# Patient Record
Sex: Male | Born: 1955 | Race: White | Hispanic: No | Smoking: Former smoker
Health system: Southern US, Community
[De-identification: ages and names within clinical notes are randomized; demographics above are authoritative.]

## PROBLEM LIST (undated history)

## (undated) DIAGNOSIS — T7840XA Allergy, unspecified, initial encounter: Secondary | ICD-10-CM

## (undated) DIAGNOSIS — E119 Type 2 diabetes mellitus without complications: Secondary | ICD-10-CM

## (undated) DIAGNOSIS — I214 Non-ST elevation (NSTEMI) myocardial infarction: Secondary | ICD-10-CM

## (undated) DIAGNOSIS — I10 Essential (primary) hypertension: Secondary | ICD-10-CM

## (undated) DIAGNOSIS — F419 Anxiety disorder, unspecified: Secondary | ICD-10-CM

## (undated) HISTORY — PX: HERNIA REPAIR: SHX51

## (undated) HISTORY — PX: KNEE SURGERY: SHX244

## (undated) HISTORY — DX: Non-ST elevation (NSTEMI) myocardial infarction: I21.4

## (undated) HISTORY — DX: Type 2 diabetes mellitus without complications: E11.9

## (undated) HISTORY — DX: Anxiety disorder, unspecified: F41.9

## (undated) HISTORY — DX: Allergy, unspecified, initial encounter: T78.40XA

## (undated) HISTORY — PX: TONSILLECTOMY: SUR1361

## (undated) HISTORY — DX: Essential (primary) hypertension: I10

---

## 2013-06-12 ENCOUNTER — Emergency Department (HOSPITAL_COMMUNITY): Payer: Self-pay

## 2013-06-12 ENCOUNTER — Encounter (HOSPITAL_COMMUNITY): Payer: Self-pay | Admitting: Emergency Medicine

## 2013-06-12 ENCOUNTER — Emergency Department (HOSPITAL_COMMUNITY)
Admission: EM | Admit: 2013-06-12 | Discharge: 2013-06-13 | Disposition: A | Discharge status: Home / Self Care | Payer: Self-pay | Attending: Emergency Medicine | Admitting: Emergency Medicine

## 2013-06-12 DIAGNOSIS — Z79899 Other long term (current) drug therapy: Secondary | ICD-10-CM | POA: Insufficient documentation

## 2013-06-12 DIAGNOSIS — R61 Generalized hyperhidrosis: Secondary | ICD-10-CM | POA: Insufficient documentation

## 2013-06-12 DIAGNOSIS — Z87891 Personal history of nicotine dependence: Secondary | ICD-10-CM | POA: Insufficient documentation

## 2013-06-12 DIAGNOSIS — Z7982 Long term (current) use of aspirin: Secondary | ICD-10-CM | POA: Insufficient documentation

## 2013-06-12 DIAGNOSIS — R110 Nausea: Secondary | ICD-10-CM | POA: Insufficient documentation

## 2013-06-12 DIAGNOSIS — R531 Weakness: Secondary | ICD-10-CM

## 2013-06-12 DIAGNOSIS — R5381 Other malaise: Secondary | ICD-10-CM | POA: Insufficient documentation

## 2013-06-12 DIAGNOSIS — R42 Dizziness and giddiness: Secondary | ICD-10-CM | POA: Insufficient documentation

## 2013-06-12 LAB — COMPREHENSIVE METABOLIC PANEL
ALT: 17 U/L (ref 0–53)
AST: 17 U/L (ref 0–37)
Alkaline Phosphatase: 73 U/L (ref 39–117)
CO2: 25 mEq/L (ref 19–32)
Calcium: 9.3 mg/dL (ref 8.4–10.5)
GFR calc non Af Amer: 78 mL/min — ABNORMAL LOW (ref >90)
Potassium: 3.7 mEq/L (ref 3.5–5.1)
Sodium: 138 mEq/L (ref 135–145)

## 2013-06-12 LAB — CBC WITH DIFFERENTIAL/PLATELET
Basophils Absolute: 0 10*3/uL (ref 0.0–0.1)
Eosinophils Relative: 1 % (ref 0–5)
Lymphocytes Relative: 12 % (ref 12–46)
Lymphs Abs: 1.9 10*3/uL (ref 0.7–4.0)
Neutro Abs: 12.3 10*3/uL — ABNORMAL HIGH (ref 1.7–7.7)
Neutrophils Relative %: 79 % — ABNORMAL HIGH (ref 43–77)
Platelets: 228 10*3/uL (ref 150–400)
RBC: 4.83 MIL/uL (ref 4.22–5.81)
RDW: 14.5 % (ref 11.5–15.5)
WBC: 15.6 10*3/uL — ABNORMAL HIGH (ref 4.0–10.5)

## 2013-06-12 LAB — URINALYSIS, ROUTINE W REFLEX MICROSCOPIC
Glucose, UA: NEGATIVE mg/dL
Ketones, ur: NEGATIVE mg/dL
Leukocytes, UA: NEGATIVE
pH: 6.5 (ref 5.0–8.0)

## 2013-06-12 LAB — URINE MICROSCOPIC-ADD ON

## 2013-06-12 NOTE — ED Provider Notes (Addendum)
CSN: [id number]     Arrival date & time 06/12/13  2006 History     First MD Initiated Contact with Patient 06/12/13 2008     No chief complaint on file.  (Consider location/radiation/quality/duration/timing/severity/associated sxs/prior Treatment) HPI 57 year old male with a significant medical history presents with feeling of generalized weakness.  Patient reports waking up this morning and feeling "not right". Reports he's been feeling "off" all day, feeling weak all over, diaphoreti at times this AM and lightheaded. Also notes 2 episodes of nausea. His symptoms were present upon waking this morning and having constant and worsened in waves.  Denies any chest pain, shortness of breath, fevers, cough, sore throat, abdominal pain, vomiting, diarrhea, dysuria, headache, numbness, focal weakness.  Nausea improved this afternoon around 2:30PM after eating.  Denies any cardiac history, and denies history of hypertension, hyperlipidemia, diabetes, history of DVT/PE, family history of DVT/PE, recent surgeries or immobilization, leg pain.  Has not seen a physician in greater than 3 years. Quit smoking 1 year ago.  History reviewed. No pertinent past medical history. Past Surgical History  Procedure Laterality Date  . Knee surgery     No family history on file. History  Substance Use Topics  . Smoking status: Former Games developer  . Smokeless tobacco: Not on file  . Alcohol Use: Not on file    Review of Systems  Constitutional: Negative for fever.  HENT: Negative for sore throat and neck stiffness.   Eyes: Negative for visual disturbance.  Respiratory: Negative for shortness of breath.   Cardiovascular: Negative for chest pain, palpitations and leg swelling.  Gastrointestinal: Positive for nausea. Negative for vomiting, abdominal pain and diarrhea.  Genitourinary: Negative for difficulty urinating.  Musculoskeletal: Negative for back pain.  Skin: Negative for rash.  Neurological: Negative for  syncope and headaches.    Allergies  Review of patient's allergies indicates no known allergies.  Home Medications   Current Outpatient Rx  Name  Route  Sig  Dispense  Refill  . aspirin EC 81 MG tablet   Oral   Take 81 mg by mouth every 30 (thirty) days. Normally takes two to three times during the month          BP 173/86  Pulse 82  Temp(Src) 98.1 F (36.7 C) (Oral)  Resp 13  Ht 5' 7.5" (1.715 m)  Wt 208 lb (94.348 kg)  BMI 32.08 kg/m2  SpO2 99% Physical Exam  Nursing note and vitals reviewed. Constitutional: He is oriented to person, place, and time. He appears well-developed and well-nourished. No distress.  HENT:  Head: Normocephalic and atraumatic.  Eyes: Conjunctivae and EOM are normal.  Neck: Normal range of motion.  Cardiovascular: Normal rate, regular rhythm, normal heart sounds and intact distal pulses.  Exam reveals no gallop and no friction rub.   No murmur heard. Pulmonary/Chest: Effort normal and breath sounds normal. No respiratory distress. He has no wheezes. He has no rales.  Abdominal: Soft. He exhibits no distension. There is no tenderness. There is no guarding.  Musculoskeletal: He exhibits no edema.  Neurological: He is alert and oriented to person, place, and time.  Skin: Skin is warm and dry. He is not diaphoretic.    ED Course   Procedures (including critical care time)  Date: 06/13/2013  Rate: 74  Rhythm: normal sinus rhythm  QRS Axis: normal  Intervals: normal  ST/T Wave abnormalities: normal, less than 1mm depression in lateral leads  Conduction Disutrbances:none  Narrative Interpretation:   Old EKG Reviewed:  none available   Labs Reviewed  CBC WITH DIFFERENTIAL - Abnormal; Notable for the following:    WBC 15.6 (*)    Neutrophils Relative % 79 (*)    Neutro Abs 12.3 (*)    Monocytes Absolute 1.2 (*)    All other components within normal limits  COMPREHENSIVE METABOLIC PANEL - Abnormal; Notable for the following:    Glucose,  Bld 113 (*)    GFR calc non Af Amer 78 (*)    All other components within normal limits  URINALYSIS, ROUTINE W REFLEX MICROSCOPIC - Abnormal; Notable for the following:    Hgb urine dipstick TRACE (*)    All other components within normal limits  TROPONIN I  MAGNESIUM  URINE MICROSCOPIC-ADD ON   Dg Chest 2 View  06/12/2013   *RADIOLOGY REPORT*  Clinical Data: Generalized weakness.  CHEST - 2 VIEW  Comparison: None.  Findings: The heart size and pulmonary vascularity are normal and the lungs are clear.  Thoracic aorta is slightly tortuous.  No acute osseous abnormality.  Slight narrowing of the trachea at the thoracic inlet may be due to an enlarged thyroid gland.  IMPRESSION: Possible enlargement of the thyroid gland.  Otherwise,  benign- appearing chest.   Original Report Authenticated By: Francene Boyers, M.D.   1. Generalized weakness     MDM  57 year old male with a significant medical history presents with feeling of generalized weakness.  Patient reports waking up this morning and feeling "not right". Patient with vague symptoms, and denies any chest pain, shortness of breath, infectious symptoms, neurologic symptoms or headache. EKG was evaluated by me and shows no acute changes consistent with ischemia and no signs of pericarditis.  Mild ST depressions lateral leads less than 1mm. Troponin was within normal limits, and given duration of patient's symptoms have low suspicion for ACS. Electrolytes within normal limits and do not explain symptoms. Vision without signs of urinary tract infection on urinalysis. Chest x-ray does not show any signs of acute heart failure, pneumonia.  Chest x-ray does show possible enlargement of thyroid gland, discussed finding the patient.  Have low suspicion of PE given no asymmetric lights on, minimal risk factors other than age, no tachypnea, no hypoxia, new tachycardia.  Unclear cause of vague symptoms and given no chest pain, SOB or exertional symptoms do not  feel admission for unstable angina is indicated.  Discussed importance of finding a primary care physician for evaluation of cardiac and other health risk factors (including likely dx of hypertension) and also recommended possible outpatient stress test and evaluation of thyroid function as outpatient given XR finding.  Family at bedside and motivated and in agreement with lifestyle changes and close PCP follow up.  Patient discharged in stable condition with understanding of reasons to return.    Rhae Lerner, MD 06/20/13 [phone]

## 2013-06-12 NOTE — ED Notes (Signed)
MD at bedside. 

## 2013-06-12 NOTE — ED Notes (Signed)
Assumed patient care. Denies pain, awaiting test results. Only c/o hunger.

## 2013-06-13 ENCOUNTER — Ambulatory Visit (INDEPENDENT_AMBULATORY_CARE_PROVIDER_SITE_OTHER): Payer: Self-pay | Admitting: Family Medicine

## 2013-06-13 VITALS — BP 178/88 | HR 82 | Temp 98.0°F | Ht 67.5 in | Wt 208.0 lb

## 2013-06-13 DIAGNOSIS — I10 Essential (primary) hypertension: Secondary | ICD-10-CM

## 2013-06-13 DIAGNOSIS — F411 Generalized anxiety disorder: Secondary | ICD-10-CM

## 2013-06-13 MED ORDER — ALPRAZOLAM 0.25 MG PO TABS: 0.2500 mg | ORAL_TABLET | Freq: Two times a day (BID) | ORAL | Status: DC | PRN

## 2013-06-13 MED ORDER — LISINOPRIL-HYDROCHLOROTHIAZIDE 20-12.5 MG PO TABS: 1.0000 | ORAL_TABLET | Freq: Every day | ORAL | Status: DC

## 2013-06-13 NOTE — Progress Notes (Signed)
  Subjective:    Patient ID: Thomas Thomas, male    DOB: 12-Nov-1955, 57 y.o.   MRN: [id number]  HPI This 57 y.o. male presents for evaluation of hypertension and anxiety.  Thomas Thomas was seen At [facility] yesterday and was sent to [facility] ER for hypertensive emergency. He was evaluated there and had negative troponin and his labs showed leukocytosis.  He denies any Infection sx's.  He has been under a lot of stress being out of work and having to care for his mother Who is in her end stages of Alzhiemers disease.  He has been upset over this and is teary in the interview.   Review of Systems C/o anxiety No chest pain, SOB, HA, dizziness, vision change, N/V, diarrhea, constipation, dysuria, urinary urgency or frequency, myalgias, arthralgias or rash.     Objective:   Physical Exam Vital signs noted  Well developed well nourished male.  HEENT - Head atraumatic Normocephalic                Eyes - PERRLA, Conjuctiva - clear Sclera- Clear EOMI                Ears - EAC's Wnl TM's Wnl Gross Hearing WNL                Nose - Nares patent                 Throat - oropharanx wnl Respiratory - Lungs CTA bilateral Cardiac - RRR S1 and S2 without murmur GI - Abdomen soft Nontender and bowel sounds active x 4 Extremities - No edema. Neuro - Grossly intact.       Assessment & Plan:  Anxiety state, unspecified - Plan: ALPRAZolam (XANAX) 0.25 MG tablet prn for anxiety.  Essential hypertension, benign - Plan: lisinopril-hydrochlorothiazide (ZESTORETIC) 20-12.5 MG per  Follow up in one month.

## 2013-06-13 NOTE — ED Provider Notes (Signed)
Medical screening examination/treatment/procedure(s) were conducted as a shared visit with resident physician and myself.  I personally evaluated the patient during the encounter.  I examined/interviewed pt. His sx are vague. Workup is negative and he is asx on exam. He continues to appear well. Will rec f/u w/ pcp.    Junius Argyle, MD 06/13/13 1118

## 2013-06-13 NOTE — Patient Instructions (Signed)

## 2013-06-19 ENCOUNTER — Telehealth: Payer: Self-pay | Admitting: Family Medicine

## 2013-06-19 NOTE — Telephone Encounter (Signed)
Feel better with the new bp med and keeping up with it and it is doing great was wondering if it was okay to have intercourse?

## 2013-06-20 NOTE — ED Provider Notes (Addendum)
Medical screening examination/treatment/procedure(s) were performed by non-physician practitioner and as supervising physician I was immediately available for consultation/collaboration.  I examined/interviewed pt. CTAB. Cardiac wnl. Abdomen benign. His sx are vague. Workup is negative and he is asx on exam. He continues to appear well. Will rec f/u w/ pcp.     Junius Argyle, MD 06/20/13 1304  Junius Argyle, MD 06/20/13 1306

## 2013-06-20 NOTE — Telephone Encounter (Signed)
Patient aware.

## 2013-06-20 NOTE — Telephone Encounter (Signed)
Okay to have intercourse on new bp med

## 2013-07-14 ENCOUNTER — Encounter: Payer: Self-pay | Admitting: Family Medicine

## 2013-07-14 ENCOUNTER — Ambulatory Visit (INDEPENDENT_AMBULATORY_CARE_PROVIDER_SITE_OTHER): Payer: Self-pay | Admitting: Family Medicine

## 2013-07-14 VITALS — BP 116/74 | HR 85 | Temp 97.4°F | Ht 67.5 in | Wt 203.4 lb

## 2013-07-14 DIAGNOSIS — I10 Essential (primary) hypertension: Secondary | ICD-10-CM

## 2013-07-14 NOTE — Patient Instructions (Signed)

## 2013-07-14 NOTE — Progress Notes (Signed)
  Subjective:    Patient ID: Thomas Thomas, male    DOB: [birth year], 57 y.o.   MRN: [id number]  HPI This 57 y.o. male presents for evaluation of follow up on his anxiety and  Hypertesion.  He has been having some left shoulder and neck discomfort After a muscle pull.   Review of Systems C/o muscle strain No chest pain, SOB, HA, dizziness, vision change, N/V, diarrhea, constipation, dysuria, urinary urgency or frequency, arthralgias or rash.     Objective:   Physical Exam Vital signs noted  Well developed well nourished male.  HEENT - Head atraumatic Normocephalic                Eyes - PERRLA, Conjuctiva - clear Sclera- Clear EOMI                Ears - EAC's Wnl TM's Wnl Gross Hearing WNL                Nose - Nares patent                 Throat - oropharanx wnl Respiratory - Lungs CTA bilateral Cardiac - RRR S1 and S2 without murmur GI - Abdomen soft Nontender and bowel sounds active x 4 Extremities - No edema. Neuro - Grossly intact. MS - TTP left scapular and rhomboid muscle      Assessment & Plan:  Essential hypertension, benign BP is low and need to cut bp medicine in half and then take one half lisinopril HCT 20/12.5 qd.  Muscle strain - Trapezius or rhomboid muscle strain.  Recommend motrin 600 to 800mg  po tid For a week.

## 2013-07-25 ENCOUNTER — Ambulatory Visit (INDEPENDENT_AMBULATORY_CARE_PROVIDER_SITE_OTHER): Payer: Self-pay

## 2013-07-25 ENCOUNTER — Encounter: Payer: Self-pay | Admitting: Family Medicine

## 2013-07-25 ENCOUNTER — Ambulatory Visit (INDEPENDENT_AMBULATORY_CARE_PROVIDER_SITE_OTHER): Payer: Self-pay | Admitting: Family Medicine

## 2013-07-25 VITALS — BP 115/74 | HR 98 | Temp 97.4°F | Ht 66.0 in | Wt 203.6 lb

## 2013-07-25 DIAGNOSIS — M542 Cervicalgia: Secondary | ICD-10-CM

## 2013-07-25 NOTE — Patient Instructions (Addendum)
Place neck pain patient instructions here.  
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
## 2013-10-27 ENCOUNTER — Ambulatory Visit: Payer: Self-pay | Admitting: Family Medicine

## 2013-11-02 ENCOUNTER — Encounter: Payer: Self-pay | Admitting: Family Medicine

## 2013-11-02 ENCOUNTER — Ambulatory Visit (INDEPENDENT_AMBULATORY_CARE_PROVIDER_SITE_OTHER): Payer: BC Managed Care – PPO | Admitting: Family Medicine

## 2013-11-02 VITALS — BP 135/77 | HR 74 | Temp 98.1°F | Ht 66.0 in | Wt 198.0 lb

## 2013-11-02 DIAGNOSIS — Z0000 Encounter for general adult medical examination without abnormal findings: Secondary | ICD-10-CM

## 2013-11-02 DIAGNOSIS — R21 Rash and other nonspecific skin eruption: Secondary | ICD-10-CM

## 2013-11-02 DIAGNOSIS — Z1211 Encounter for screening for malignant neoplasm of colon: Secondary | ICD-10-CM

## 2013-11-02 LAB — POCT CBC
Granulocyte percent: 79 %G (ref 37–80)
HCT, POC: 43.1 % — AB (ref 43.5–53.7)
Hemoglobin: 13.9 g/dL — AB (ref 14.1–18.1)
Lymph, poc: 1.8 (ref 0.6–3.4)
MCH, POC: 28.5 pg (ref 27–31.2)
MCHC: 32.1 g/dL (ref 31.8–35.4)
MCV: 88.6 fL (ref 80–97)
MPV: 10.1 fL (ref 0–99.8)
POC Granulocyte: 9.3 — AB (ref 2–6.9)
POC LYMPH PERCENT: 15.2 %L (ref 10–50)
Platelet Count, POC: 216 10*3/uL (ref 142–424)
RBC: 4.9 M/uL (ref 4.69–6.13)
RDW, POC: 14.6 %
WBC: 11.8 10*3/uL — AB (ref 4.6–10.2)

## 2013-11-02 MED ORDER — NYSTATIN-TRIAMCINOLONE 100000-0.1 UNIT/GM-% EX OINT: 1.0000 "application " | TOPICAL_OINTMENT | Freq: Two times a day (BID) | CUTANEOUS | Status: DC

## 2013-11-02 MED ORDER — LISINOPRIL-HYDROCHLOROTHIAZIDE 20-12.5 MG PO TABS: 0.5000 | ORAL_TABLET | Freq: Every day | ORAL | Status: DC

## 2013-11-02 MED ORDER — OMEPRAZOLE 40 MG PO CPDR: 40.0000 mg | DELAYED_RELEASE_CAPSULE | Freq: Every day | ORAL | Status: DC

## 2013-11-02 NOTE — Progress Notes (Signed)
   Subjective:    Patient ID: Thomas Thomas, male    DOB: Oct 08, 1956, 58 y.o.   MRN: [id number]  HPI This 58 y.o. male presents for evaluation of hypertension.  He has hx of anxiety and states He has made changes to his life and is not stressed out anymore.  He states he doesn't take Xanax anymore and has plenty left in the bottle.  He has not had colonoscopy and he is due for Physical and CPE labs.  He has been checking his bp at home and it runs 130/70's.   Review of Systems    No chest pain, SOB, HA, dizziness, vision change, N/V, diarrhea, constipation, dysuria, urinary urgency or frequency, myalgias, arthralgias or rash.  Objective:   Physical Exam  Vital signs noted  Well developed well nourished male.  HEENT - Head atraumatic Normocephalic                Eyes - PERRLA, Conjuctiva - clear Sclera- Clear EOMI                Ears - EAC's Wnl TM's Wnl Gross Hearing WNL                Nose - Nares patent                 Throat - oropharanx wnl Respiratory - Lungs CTA bilateral Cardiac - RRR S1 and S2 without murmur GI - Abdomen soft Nontender and bowel sounds active x 4 Rectal - Declines Extremities - No edema. Neuro - Grossly intact.      Assessment & Plan:  Health care maintenance - Plan: Ambulatory referral to Gastroenterology, lisinopril-hydrochlorothiazide (PRINZIDE,ZESTORETIC) 20-12.5 MG per tablet, nystatin-triamcinolone ointment (MYCOLOG)  Rash and nonspecific skin eruption - Plan: lisinopril-hydrochlorothiazide (PRINZIDE,ZESTORETIC) 20-12.5 MG per tablet, nystatin-triamcinolone ointment (MYCOLOG)  Screening for colon cancer - Plan: Ambulatory referral to Gastroenterology  Routine general medical examination at a health care facility - Plan: POCT CBC, CMP14+EGFR, Lipid panel, PSA, total and free, Vit D  25 hydroxy (rtn osteoporosis monitoring)  Follow up in 6 months.  Lysbeth Penner FNP

## 2013-11-02 NOTE — Patient Instructions (Addendum)

## 2013-11-02 NOTE — Progress Notes (Signed)
Discussed need for screening colonoscopy and referral placed.

## 2013-11-03 LAB — CMP14+EGFR
ALT: 8 IU/L (ref 0–44)
AST: 15 IU/L (ref 0–40)
Albumin/Globulin Ratio: 1.8 (ref 1.1–2.5)
Albumin: 4.4 g/dL (ref 3.5–5.5)
Alkaline Phosphatase: 72 IU/L (ref 39–117)
BUN/Creatinine Ratio: 10 (ref 9–20)
BUN: 12 mg/dL (ref 6–24)
CO2: 23 mmol/L (ref 18–29)
Calcium: 9.6 mg/dL (ref 8.7–10.2)
Chloride: 97 mmol/L (ref 97–108)
Creatinine, Ser: 1.16 mg/dL (ref 0.76–1.27)
GFR calc Af Amer: 80 mL/min/{1.73_m2} (ref >59)
GFR calc non Af Amer: 69 mL/min/{1.73_m2} (ref >59)
Globulin, Total: 2.5 g/dL (ref 1.5–4.5)
Glucose: 129 mg/dL — ABNORMAL HIGH (ref 65–99)
Potassium: 4.4 mmol/L (ref 3.5–5.2)
Sodium: 137 mmol/L (ref 134–144)
Total Bilirubin: 0.3 mg/dL (ref 0.0–1.2)
Total Protein: 6.9 g/dL (ref 6.0–8.5)

## 2013-11-03 LAB — LIPID PANEL
Chol/HDL Ratio: 4.6 ratio units (ref 0.0–5.0)
Cholesterol, Total: 166 mg/dL (ref 100–199)
HDL: 36 mg/dL — ABNORMAL LOW (ref >39)
LDL Calculated: 119 mg/dL — ABNORMAL HIGH (ref 0–99)
Triglycerides: 55 mg/dL (ref 0–149)
VLDL Cholesterol Cal: 11 mg/dL (ref 5–40)

## 2013-11-03 LAB — PSA, TOTAL AND FREE
PSA, Free Pct: 11.3 %
PSA, Free: 0.17 ng/mL
PSA: 1.5 ng/mL (ref 0.0–4.0)

## 2013-11-03 LAB — VITAMIN D 25 HYDROXY (VIT D DEFICIENCY, FRACTURES): Vit D, 25-Hydroxy: 16.9 ng/mL — ABNORMAL LOW (ref 30.0–100.0)

## 2013-11-06 ENCOUNTER — Telehealth: Payer: Self-pay | Admitting: Family Medicine

## 2013-11-06 ENCOUNTER — Other Ambulatory Visit: Payer: Self-pay | Admitting: Family Medicine

## 2013-11-06 MED ORDER — VITAMIN D (ERGOCALCIFEROL) 1.25 MG (50000 UNIT) PO CAPS: 50000.0000 [IU] | ORAL_CAPSULE | ORAL | Status: DC

## 2013-11-06 NOTE — Telephone Encounter (Signed)
please review labs

## 2013-11-07 ENCOUNTER — Telehealth: Payer: Self-pay | Admitting: Family Medicine

## 2013-11-14 ENCOUNTER — Encounter: Payer: Self-pay | Admitting: Internal Medicine

## 2013-11-16 ENCOUNTER — Telehealth: Payer: Self-pay | Admitting: Family Medicine

## 2013-11-22 ENCOUNTER — Other Ambulatory Visit (INDEPENDENT_AMBULATORY_CARE_PROVIDER_SITE_OTHER): Payer: BC Managed Care – PPO

## 2013-11-22 DIAGNOSIS — R7989 Other specified abnormal findings of blood chemistry: Secondary | ICD-10-CM

## 2013-11-22 LAB — POCT CBC
Granulocyte percent: 75.9 %G (ref 37–80)
HCT, POC: 42.6 % — AB (ref 43.5–53.7)
Hemoglobin: 13.5 g/dL — AB (ref 14.1–18.1)
Lymph, poc: 1.9 (ref 0.6–3.4)
MCH, POC: 28.4 pg (ref 27–31.2)
MCHC: 31.7 g/dL — AB (ref 31.8–35.4)
MCV: 89.6 fL (ref 80–97)
MPV: 11.4 fL (ref 0–99.8)
POC Granulocyte: 8.4 — AB (ref 2–6.9)
POC LYMPH PERCENT: 17 %L (ref 10–50)
Platelet Count, POC: 248 10*3/uL (ref 142–424)
RBC: 4.8 M/uL (ref 4.69–6.13)
RDW, POC: 14.8 %
WBC: 11.1 10*3/uL — AB (ref 4.6–10.2)

## 2013-11-22 NOTE — Progress Notes (Signed)
Patient came in for labs only.

## 2013-11-28 ENCOUNTER — Telehealth: Payer: Self-pay | Admitting: Family Medicine

## 2013-12-04 ENCOUNTER — Telehealth: Payer: Self-pay | Admitting: Family Medicine

## 2013-12-04 NOTE — Telephone Encounter (Signed)
In another encounter 

## 2013-12-04 NOTE — Telephone Encounter (Signed)
He said we were suppose to test for his bs and this in not in the system please order one and go over his labs patient is getting upset

## 2013-12-07 ENCOUNTER — Encounter: Payer: Self-pay | Admitting: Internal Medicine

## 2014-01-04 ENCOUNTER — Telehealth: Payer: Self-pay | Admitting: Family Medicine

## 2014-01-04 DIAGNOSIS — R21 Rash and other nonspecific skin eruption: Secondary | ICD-10-CM

## 2014-01-04 DIAGNOSIS — D649 Anemia, unspecified: Secondary | ICD-10-CM

## 2014-01-05 NOTE — Telephone Encounter (Signed)
Most recent CBC showed that Hgb was decreasing and WBC were still elevated although improved. Repeat CBC ordered for next week.   Patient also states that rash on hands and feet have improved but have not resolved. He would like [place]. Referral placed.

## 2014-03-09 ENCOUNTER — Telehealth: Payer: Self-pay | Admitting: Family Medicine

## 2014-03-13 NOTE — Telephone Encounter (Signed)
Spoke with pt to inform he will need OV appt scheduled

## 2014-03-14 ENCOUNTER — Encounter: Payer: Self-pay | Admitting: Family Medicine

## 2014-03-14 ENCOUNTER — Ambulatory Visit (INDEPENDENT_AMBULATORY_CARE_PROVIDER_SITE_OTHER): Payer: BC Managed Care – PPO | Admitting: Family Medicine

## 2014-03-14 ENCOUNTER — Ambulatory Visit (INDEPENDENT_AMBULATORY_CARE_PROVIDER_SITE_OTHER): Payer: BC Managed Care – PPO

## 2014-03-14 VITALS — BP 113/72 | HR 72 | Temp 98.1°F | Wt 197.8 lb

## 2014-03-14 DIAGNOSIS — N529 Male erectile dysfunction, unspecified: Secondary | ICD-10-CM

## 2014-03-14 DIAGNOSIS — K219 Gastro-esophageal reflux disease without esophagitis: Secondary | ICD-10-CM

## 2014-03-14 DIAGNOSIS — M25519 Pain in unspecified shoulder: Secondary | ICD-10-CM

## 2014-03-14 DIAGNOSIS — M25512 Pain in left shoulder: Secondary | ICD-10-CM

## 2014-03-14 MED ORDER — NAPROXEN 500 MG PO TABS: 500.0000 mg | ORAL_TABLET | Freq: Two times a day (BID) | ORAL | Status: DC

## 2014-03-14 MED ORDER — OMEPRAZOLE 40 MG PO CPDR: 40.0000 mg | DELAYED_RELEASE_CAPSULE | Freq: Every day | ORAL | Status: DC

## 2014-03-14 MED ORDER — SILDENAFIL CITRATE 100 MG PO TABS: 50.0000 mg | ORAL_TABLET | Freq: Every day | ORAL | Status: DC | PRN

## 2014-03-14 MED ORDER — HYDROCODONE-ACETAMINOPHEN 5-325 MG PO TABS: 1.0000 | ORAL_TABLET | Freq: Four times a day (QID) | ORAL | Status: DC | PRN

## 2014-03-14 NOTE — Progress Notes (Signed)
   Subjective:    Patient ID: Thomas Thomas, male    DOB: 01/27/1956, 58 y.o.   MRN: [id number]  HPI This 58 y.o. male presents for evaluation of left shoulder discomfort, left scapula discomfort, and  ED problems.   Review of Systems No chest pain, SOB, HA, dizziness, vision change, N/V, diarrhea, constipation, dysuria, urinary urgency or frequency, myalgias, arthralgias or rash.     Objective:   Physical Exam Vital signs noted  Well developed well nourished male.  HEENT - Head atraumatic Normocephalic                Eyes - PERRLA, Conjuctiva - clear Sclera- Clear EOMI                Ears - EAC's Wnl TM's Wnl Gross Hearing WNL                Throat - oropharanx wnl Respiratory - Lungs CTA bilateral Cardiac - RRR S1 and S2 without murmur GI - Abdomen soft Nontender and bowel sounds active x 4 Extremities - No edema. Neuro - Grossly intact. MS - TTP left sub scapular muscles and left biceps tendon   Left shoulder xray - no fracture    Assessment & Plan:  Shoulder pain, left - Plan: DG Shoulder Left, naproxen (NAPROSYN) 500 MG tablet, HYDROcodone-acetaminophen (NORCO) 5-325 MG per tablet  ED (erectile dysfunction) - Plan: sildenafil (VIAGRA) 100 MG tablet  GERD (gastroesophageal reflux disease) - Plan: omeprazole (PRILOSEC) 40 MG capsule  Deatra CanterWilliam J Leaf Kernodle FNP

## 2014-05-01 ENCOUNTER — Telehealth: Payer: Self-pay | Admitting: Family Medicine

## 2014-05-01 NOTE — Telephone Encounter (Signed)
appt scheduled for 7/22 with bill

## 2014-05-02 ENCOUNTER — Ambulatory Visit: Payer: BC Managed Care – PPO | Admitting: Family Medicine

## 2014-05-16 ENCOUNTER — Ambulatory Visit: Payer: BC Managed Care – PPO | Admitting: Family Medicine

## 2014-07-03 ENCOUNTER — Encounter: Payer: Self-pay | Admitting: Family Medicine

## 2014-07-03 ENCOUNTER — Telehealth: Payer: Self-pay | Admitting: Family Medicine

## 2014-07-03 ENCOUNTER — Ambulatory Visit (INDEPENDENT_AMBULATORY_CARE_PROVIDER_SITE_OTHER): Payer: BC Managed Care – PPO | Admitting: Family Medicine

## 2014-07-03 VITALS — BP 151/84 | HR 75 | Temp 97.1°F | Ht 66.0 in | Wt 188.6 lb

## 2014-07-03 DIAGNOSIS — R5383 Other fatigue: Secondary | ICD-10-CM

## 2014-07-03 DIAGNOSIS — R5381 Other malaise: Secondary | ICD-10-CM

## 2014-07-03 DIAGNOSIS — E785 Hyperlipidemia, unspecified: Secondary | ICD-10-CM

## 2014-07-03 DIAGNOSIS — E559 Vitamin D deficiency, unspecified: Secondary | ICD-10-CM

## 2014-07-03 DIAGNOSIS — R7309 Other abnormal glucose: Secondary | ICD-10-CM

## 2014-07-03 DIAGNOSIS — R739 Hyperglycemia, unspecified: Secondary | ICD-10-CM

## 2014-07-03 LAB — POCT CBC
Granulocyte percent: 85.1 %G — AB (ref 37–80)
HCT, POC: 44.7 % (ref 43.5–53.7)
Hemoglobin: 14.4 g/dL (ref 14.1–18.1)
Lymph, poc: 2.1 (ref 0.6–3.4)
MCH, POC: 28.1 pg (ref 27–31.2)
MCHC: 32.3 g/dL (ref 31.8–35.4)
MCV: 87 fL (ref 80–97)
MPV: 10.4 fL (ref 0–99.8)
POC Granulocyte: 13.6 — AB (ref 2–6.9)
POC LYMPH PERCENT: 13 %L (ref 10–50)
Platelet Count, POC: 193 10*3/uL (ref 142–424)
RBC: 5.1 M/uL (ref 4.69–6.13)
RDW, POC: 14.8 %
WBC: 16 10*3/uL — AB (ref 4.6–10.2)

## 2014-07-03 LAB — POCT GLYCOSYLATED HEMOGLOBIN (HGB A1C): Hemoglobin A1C: 6

## 2014-07-03 NOTE — Progress Notes (Signed)
   Subjective:    Patient ID: Thomas Thomas, male    DOB: 1956-09-19, 58 y.o.   MRN: [id number]  HPI This 58 y.o. male presents for evaluation of routine visit.  He has lost weight and has been off his Medication.  He has hx of hyperglycemia and hypertension.  He has had problems with rash on his hands and has seen dermatology and he states he put some acid on his hand that made his skin peel and then used clobetasol proprianate and this helped the rash resolve.   Review of Systems C/o rash No chest pain, SOB, HA, dizziness, vision change, N/V, diarrhea, constipation, dysuria, urinary urgency or frequency, myalgias, arthralgias.     Objective:   Physical Exam  Vital signs noted  Well developed well nourished male.  HEENT - Head atraumatic Normocephalic                Eyes - PERRLA, Conjuctiva - clear Sclera- Clear EOMI                Ears - EAC's Wnl TM's Wnl Gross Hearing WNL                Nose - Nares patent                 Throat - oropharanx wnl Respiratory - Lungs CTA bilateral Cardiac - RRR S1 and S2 without murmur GI - Abdomen soft Nontender and bowel sounds active x 4 Extremities - No edema. Neuro - Grossly intact.      Assessment & Plan:  Hyperlipemia - Plan: CMP14+EGFR, Lipid panel  Hyperglycemia - Plan: POCT glycosylated hemoglobin (Hb A1C), CMP14+EGFR  Unspecified vitamin D deficiency - Plan: Vit D  25 hydroxy (rtn osteoporosis monitoring)  Other malaise and fatigue - Plan: POCT CBC, Vit D  25 hydroxy (rtn osteoporosis monitoring)  Lysbeth Penner FNP

## 2014-07-04 ENCOUNTER — Other Ambulatory Visit: Payer: Self-pay | Admitting: Family Medicine

## 2014-07-04 ENCOUNTER — Ambulatory Visit: Payer: BC Managed Care – PPO | Admitting: Family Medicine

## 2014-07-04 ENCOUNTER — Telehealth: Payer: Self-pay | Admitting: Family Medicine

## 2014-07-04 LAB — CMP14+EGFR
ALT: 11 IU/L (ref 0–44)
AST: 10 IU/L (ref 0–40)
Albumin/Globulin Ratio: 1.9 (ref 1.1–2.5)
Albumin: 4.1 g/dL (ref 3.5–5.5)
Alkaline Phosphatase: 79 IU/L (ref 39–117)
BUN/Creatinine Ratio: 9 (ref 9–20)
BUN: 11 mg/dL (ref 6–24)
CO2: 24 mmol/L (ref 18–29)
Calcium: 9 mg/dL (ref 8.7–10.2)
Chloride: 101 mmol/L (ref 97–108)
Creatinine, Ser: 1.2 mg/dL (ref 0.76–1.27)
GFR calc Af Amer: 76 mL/min/{1.73_m2} (ref >59)
GFR calc non Af Amer: 66 mL/min/{1.73_m2} (ref >59)
Globulin, Total: 2.2 g/dL (ref 1.5–4.5)
Glucose: 116 mg/dL — ABNORMAL HIGH (ref 65–99)
Potassium: 4.5 mmol/L (ref 3.5–5.2)
Sodium: 140 mmol/L (ref 134–144)
Total Bilirubin: 0.3 mg/dL (ref 0.0–1.2)
Total Protein: 6.3 g/dL (ref 6.0–8.5)

## 2014-07-04 LAB — VITAMIN D 25 HYDROXY (VIT D DEFICIENCY, FRACTURES): Vit D, 25-Hydroxy: 29.9 ng/mL — ABNORMAL LOW (ref 30.0–100.0)

## 2014-07-04 LAB — LIPID PANEL
Chol/HDL Ratio: 3.6 ratio units (ref 0.0–5.0)
Cholesterol, Total: 140 mg/dL (ref 100–199)
HDL: 39 mg/dL — ABNORMAL LOW (ref >39)
LDL Calculated: 92 mg/dL (ref 0–99)
Triglycerides: 47 mg/dL (ref 0–149)
VLDL Cholesterol Cal: 9 mg/dL (ref 5–40)

## 2014-07-04 MED ORDER — AMOXICILLIN 875 MG PO TABS: 875.0000 mg | ORAL_TABLET | Freq: Two times a day (BID) | ORAL | Status: DC

## 2014-07-04 NOTE — Telephone Encounter (Signed)
Message copied by Azalee Course on Wed Jul 04, 2014 11:26 AM ------      Message from: Deatra Canter      Created: Wed Jul 04, 2014  9:04 AM       Vitamin D slightly low and would take vitamin D 2000 iu po qd.  Hgbaic is normal.  Lipids look good.  Liver and kidney fx is normal.  WBC are elevated and has URI ask if he needs abx? ------

## 2014-07-04 NOTE — Telephone Encounter (Signed)
He had infection in hand / feet.  His hand is better not completely healed.  He was given halobetasol 0.05%, apply 2 times a day by his dermatologist.  He also is letting Bill know [facility] mixed him EaSalicyclic Acid 50% in PE to help peal skin off hands.  This also worked.  Pt is aware of lab results.  Please forward to [facility] [employer] [facility] for [place].

## 2014-07-04 NOTE — Telephone Encounter (Signed)
Amoxicillin sent to pharmacy

## 2014-08-21 ENCOUNTER — Ambulatory Visit (INDEPENDENT_AMBULATORY_CARE_PROVIDER_SITE_OTHER): Payer: BC Managed Care – PPO

## 2014-08-21 DIAGNOSIS — Z23 Encounter for immunization: Secondary | ICD-10-CM

## 2014-09-18 ENCOUNTER — Encounter: Payer: Self-pay | Admitting: *Deleted

## 2014-10-15 ENCOUNTER — Other Ambulatory Visit: Payer: Self-pay | Admitting: Family Medicine

## 2014-10-15 NOTE — Telephone Encounter (Signed)
[facility]

## 2014-10-15 NOTE — Telephone Encounter (Signed)
This is okay to refill the patient's Cialis

## 2014-10-16 NOTE — Telephone Encounter (Signed)
Patient aware no samples available.  He had used them before and would like a script of Cialis , take one pill daily, [facility].

## 2014-10-16 NOTE — Telephone Encounter (Signed)
Thomas NiemannJaime, I'm not sure what this patient wants. We can refill the Cialis and when we get samples we can give him some. What is he requesting?

## 2014-10-16 NOTE — Telephone Encounter (Signed)
This is being done in a telephone order

## 2014-10-24 ENCOUNTER — Other Ambulatory Visit: Payer: Self-pay | Admitting: Family Medicine

## 2014-10-24 MED ORDER — TADALAFIL 20 MG PO TABS: 10.0000 mg | ORAL_TABLET | ORAL | Status: DC | PRN

## 2014-11-01 ENCOUNTER — Telehealth: Payer: Self-pay | Admitting: *Deleted

## 2014-11-01 NOTE — Telephone Encounter (Signed)
Bill ins co denied coverage saying formulary drugs must be tried and failed first and they are listing viagra as the only formulary drug to try. Can you take care of this for me? Thanks

## 2014-11-13 ENCOUNTER — Other Ambulatory Visit: Payer: Self-pay | Admitting: Family Medicine

## 2014-11-13 MED ORDER — SILDENAFIL CITRATE 100 MG PO TABS: 50.0000 mg | ORAL_TABLET | Freq: Every day | ORAL | Status: DC | PRN

## 2014-11-23 NOTE — Telephone Encounter (Signed)
New script given

## 2015-01-01 ENCOUNTER — Ambulatory Visit: Payer: BC Managed Care – PPO | Admitting: Family Medicine

## 2015-01-02 ENCOUNTER — Ambulatory Visit: Payer: BC Managed Care – PPO | Admitting: Family

## 2015-01-24 ENCOUNTER — Ambulatory Visit (INDEPENDENT_AMBULATORY_CARE_PROVIDER_SITE_OTHER): Payer: 59 | Admitting: Family

## 2015-01-24 ENCOUNTER — Encounter: Payer: Self-pay | Admitting: Family

## 2015-01-24 VITALS — BP 162/90 | HR 79 | Temp 97.4°F | Ht 66.0 in | Wt 203.6 lb

## 2015-01-24 DIAGNOSIS — K219 Gastro-esophageal reflux disease without esophagitis: Secondary | ICD-10-CM

## 2015-01-24 DIAGNOSIS — Z1321 Encounter for screening for nutritional disorder: Secondary | ICD-10-CM

## 2015-01-24 DIAGNOSIS — L209 Atopic dermatitis, unspecified: Secondary | ICD-10-CM | POA: Diagnosis not present

## 2015-01-24 MED ORDER — DESONIDE 0.05 % EX CREA: TOPICAL_CREAM | Freq: Two times a day (BID) | CUTANEOUS | Status: DC

## 2015-01-24 NOTE — Progress Notes (Signed)
   Subjective:    Patient ID: Thomas Thomas, male    DOB: 1956-07-18, 59 y.o.   MRN: [id number]  HPI Pt presents to the office today for chronic follow up. Pt currently not taking any medication at this time. Pt states he takes his BP at home everyday and states it avg on 130's/70's and states he has "ate ham the last two days". Pt does not want to be on medications at this time. Pt is complaining of hand    Review of Systems  Constitutional: Negative.   HENT: Negative.   Respiratory: Negative.   Cardiovascular: Negative.   Gastrointestinal: Negative.   Endocrine: Negative.   Genitourinary: Negative.   Musculoskeletal: Negative.   Neurological: Negative.   Hematological: Negative.   Psychiatric/Behavioral: Negative.   All other systems reviewed and are negative.      Objective:   Physical Exam  Constitutional: He is oriented to person, place, and time. He appears well-developed and well-nourished. No distress.  HENT:  Head: Normocephalic.  Right Ear: External ear normal.  Left Ear: External ear normal.  Nose: Nose normal.  Mouth/Throat: Oropharynx is clear and moist.  Eyes: Pupils are equal, round, and reactive to light. Right eye exhibits no discharge. Left eye exhibits no discharge.  Neck: Normal range of motion. Neck supple. No thyromegaly present.  Cardiovascular: Normal rate, regular rhythm, normal heart sounds and intact distal pulses.   No murmur heard. Pulmonary/Chest: Effort normal and breath sounds normal. No respiratory distress. He has no wheezes.  Abdominal: Soft. Bowel sounds are normal. He exhibits no distension. There is no tenderness.  Musculoskeletal: Normal range of motion. He exhibits no edema or tenderness.  Neurological: He is alert and oriented to person, place, and time. He has normal reflexes. No cranial nerve deficit.  Skin: Skin is warm and dry. No rash noted. There is erythema.  Psychiatric: He has a normal mood and affect. His behavior is normal.  Judgment and thought content normal.  Vitals reviewed.   BP 162/90 mmHg  Pulse 79  Temp(Src) 97.4 F (36.3 C) (Oral)  Ht $R'5\' 6"'Cz$  (1.676 m)  Wt 203 lb 9.6 oz (92.352 kg)  BMI 32.88 kg/m2      Assessment & Plan:  1. Gastroesophageal reflux disease without esophagitis - CMP14+EGFR  2. Atopic dermatitis -Avoid hot baths -Immediately after a bath or shower, when the skin is still damp, apply a moisturizing ointment to the entire body - CMP14+EGFR - desonide (DESOWEN) 0.05 % cream; Apply topically 2 (two) times daily.  Dispense: 60 g; Refill: 6  3. Encounter for vitamin deficiency screening - Vit D  25 hydroxy (rtn osteoporosis monitoring)   Continue all meds Labs pending Health Maintenance reviewed Diet and exercise encouraged RTO 6 months  Evelina Dun, FNP

## 2015-01-24 NOTE — Patient Instructions (Signed)
Eczema Eczema, also called atopic dermatitis, is a skin disorder that causes inflammation of the skin. It causes a red rash and dry, scaly skin. The skin becomes very itchy. Eczema is generally worse during the cooler winter months and often improves with the warmth of summer. Eczema usually starts showing signs in infancy. Some children outgrow eczema, but it may last through adulthood.  CAUSES  The exact cause of eczema is not known, but it appears to run in families. People with eczema often have a family history of eczema, allergies, asthma, or hay fever. Eczema is not contagious. Flare-ups of the condition may be caused by:   Contact with something you are sensitive or allergic to.   Stress. SIGNS AND SYMPTOMS  Dry, scaly skin.   Red, itchy rash.   Itchiness. This may occur before the skin rash and may be very intense.  DIAGNOSIS  The diagnosis of eczema is usually made based on symptoms and medical history. TREATMENT  Eczema cannot be cured, but symptoms usually can be controlled with treatment and other strategies. A treatment plan might include:  Controlling the itching and scratching.   Use over-the-counter antihistamines as directed for itching. This is especially useful at night when the itching tends to be worse.   Use over-the-counter steroid creams as directed for itching.   Avoid scratching. Scratching makes the rash and itching worse. It may also result in a skin infection (impetigo) due to a break in the skin caused by scratching.   Keeping the skin well moisturized with creams every day. This will seal in moisture and help prevent dryness. Lotions that contain alcohol and water should be avoided because they can dry the skin.   Limiting exposure to things that you are sensitive or allergic to (allergens).   Recognizing situations that cause stress.   Developing a plan to manage stress.  HOME CARE INSTRUCTIONS   Only take over-the-counter or  prescription medicines as directed by your health care provider.   Do not use anything on the skin without checking with your health care provider.   Keep baths or showers short (5 minutes) in warm (not hot) water. Use mild cleansers for bathing. These should be unscented. You may add nonperfumed bath oil to the bath water. It is best to avoid soap and bubble bath.   Immediately after a bath or shower, when the skin is still damp, apply a moisturizing ointment to the entire body. This ointment should be a petroleum ointment. This will seal in moisture and help prevent dryness. The thicker the ointment, the better. These should be unscented.   Keep fingernails cut short. Children with eczema may need to wear soft gloves or mittens at night after applying an ointment.   Dress in clothes made of cotton or cotton blends. Dress lightly, because heat increases itching.   A child with eczema should stay away from anyone with fever blisters or cold sores. The virus that causes fever blisters (herpes simplex) can cause a serious skin infection in children with eczema. SEEK MEDICAL CARE IF:   Your itching interferes with sleep.   Your rash gets worse or is not better within 1 week after starting treatment.   You see pus or soft yellow scabs in the rash area.   You have a fever.   You have a rash flare-up after contact with someone who has fever blisters.  Document Released: 10/09/2000 Document Revised: 08/02/2013 Document Reviewed: 05/15/2013 [employer] Patient Information 2015 [employer]. This information   is not intended to replace advice given to you by your health care provider. Make sure you discuss any questions you have with your health care provider.  

## 2015-01-25 ENCOUNTER — Other Ambulatory Visit: Payer: Self-pay | Admitting: Family

## 2015-01-25 DIAGNOSIS — E559 Vitamin D deficiency, unspecified: Secondary | ICD-10-CM | POA: Insufficient documentation

## 2015-01-25 LAB — CMP14+EGFR
ALBUMIN: 4.3 g/dL (ref 3.6–4.8)
ALT: 16 IU/L (ref 0–44)
AST: 17 IU/L (ref 0–40)
Albumin/Globulin Ratio: 1.8 (ref 1.1–2.5)
Alkaline Phosphatase: 66 IU/L (ref 39–117)
BUN/Creatinine Ratio: 10 (ref 10–22)
BUN: 12 mg/dL (ref 8–27)
Bilirubin Total: 0.2 mg/dL (ref 0.0–1.2)
CO2: 23 mmol/L (ref 18–29)
CREATININE: 1.16 mg/dL (ref 0.76–1.27)
Calcium: 9.5 mg/dL (ref 8.6–10.2)
Chloride: 103 mmol/L (ref 97–108)
GFR calc non Af Amer: 68 mL/min/{1.73_m2} (ref >59)
GFR, EST AFRICAN AMERICAN: 79 mL/min/{1.73_m2} (ref >59)
GLOBULIN, TOTAL: 2.4 g/dL (ref 1.5–4.5)
Glucose: 119 mg/dL — ABNORMAL HIGH (ref 65–99)
Potassium: 4.7 mmol/L (ref 3.5–5.2)
SODIUM: 141 mmol/L (ref 134–144)
TOTAL PROTEIN: 6.7 g/dL (ref 6.0–8.5)

## 2015-01-25 LAB — VITAMIN D 25 HYDROXY (VIT D DEFICIENCY, FRACTURES): Vit D, 25-Hydroxy: 22.7 ng/mL — ABNORMAL LOW (ref 30.0–100.0)

## 2015-01-25 MED ORDER — VITAMIN D (ERGOCALCIFEROL) 1.25 MG (50000 UNIT) PO CAPS: 50000.0000 [IU] | ORAL_CAPSULE | ORAL | Status: DC

## 2015-01-28 ENCOUNTER — Telehealth: Payer: Self-pay | Admitting: Family

## 2015-01-28 NOTE — Telephone Encounter (Signed)
Spoke to pharmacist and advised ok to change.

## 2015-01-28 NOTE — Telephone Encounter (Signed)
Yes that is fine

## 2015-07-29 ENCOUNTER — Ambulatory Visit (INDEPENDENT_AMBULATORY_CARE_PROVIDER_SITE_OTHER): Payer: BLUE CROSS/BLUE SHIELD | Admitting: Family

## 2015-07-29 ENCOUNTER — Encounter: Payer: Self-pay | Admitting: Family

## 2015-07-29 ENCOUNTER — Encounter (INDEPENDENT_AMBULATORY_CARE_PROVIDER_SITE_OTHER): Payer: Self-pay

## 2015-07-29 VITALS — BP 156/86 | HR 71 | Temp 97.4°F | Ht 66.0 in | Wt 193.2 lb

## 2015-07-29 DIAGNOSIS — Z23 Encounter for immunization: Secondary | ICD-10-CM

## 2015-07-29 DIAGNOSIS — Z125 Encounter for screening for malignant neoplasm of prostate: Secondary | ICD-10-CM

## 2015-07-29 DIAGNOSIS — E559 Vitamin D deficiency, unspecified: Secondary | ICD-10-CM

## 2015-07-29 DIAGNOSIS — K219 Gastro-esophageal reflux disease without esophagitis: Secondary | ICD-10-CM | POA: Diagnosis not present

## 2015-07-29 DIAGNOSIS — E8881 Metabolic syndrome: Secondary | ICD-10-CM | POA: Diagnosis not present

## 2015-07-29 DIAGNOSIS — Z1211 Encounter for screening for malignant neoplasm of colon: Secondary | ICD-10-CM

## 2015-07-29 NOTE — Addendum Note (Signed)
Addended by: Lorelee Cover C on: 07/29/2015 10:58 AM   Modules accepted: Orders

## 2015-07-29 NOTE — Patient Instructions (Signed)

## 2015-07-29 NOTE — Progress Notes (Signed)
Subjective:    Patient ID: Thomas Thomas, male    DOB: 31-Mar-1956, 59 y.o.   MRN: [id number]  Pt presents to the office today for chronic follow up. Pt states his blood pressure is elevated today because he is dealing with some "family drama". 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 Pt states he does not want to start on blood pressure medication at this time, but states he will continue to monitor it at home. Pt states if it does not decrease he will call and restart his BP medication.  Gastrophageal Reflux He reports no belching, no coughing or no heartburn. This is a chronic problem. The current episode started more than 1 year ago. The problem occurs rarely. The problem has been resolved. The symptoms are aggravated by medications. Risk factors include obesity. He has tried an antacid and a diet change for the symptoms. The treatment provided significant relief.  Hypertension This is a chronic problem. The current episode started more than 1 year ago. The problem has been waxing and waning since onset. The problem is uncontrolled. Pertinent negatives include no anxiety, headaches, palpitations or peripheral edema. Risk factors for coronary artery disease include male gender. Past treatments include nothing. The current treatment provides no improvement. There is no history of kidney disease, CAD/MI, CVA, heart failure or a thyroid problem.      Review of Systems  Constitutional: Negative.   HENT: Negative.   Respiratory: Negative.  Negative for cough.   Cardiovascular: Negative for palpitations.  Gastrointestinal: Negative.  Negative for heartburn.  Endocrine: Negative.   Genitourinary: Negative.   Musculoskeletal: Negative.   Neurological: Negative.  Negative for headaches.  Hematological: Negative.   Psychiatric/Behavioral: Negative.   All other systems reviewed and are negative.      Objective:   Physical Exam    Constitutional: He is oriented to person, place, and time. He appears well-developed and well-nourished. No distress.  HENT:  Head: Normocephalic.  Right Ear: External ear normal.  Left Ear: External ear normal.  Nose: Nose normal.  Mouth/Throat: Oropharynx is clear and moist.  Eyes: Pupils are equal, round, and reactive to light. Right eye exhibits no discharge. Left eye exhibits no discharge.  Neck: Normal range of motion. Neck supple. No thyromegaly present.  Cardiovascular: Normal rate, regular rhythm, normal heart sounds and intact distal pulses.   No murmur heard. Pulmonary/Chest: Effort normal and breath sounds normal. No respiratory distress. He has no wheezes.  Abdominal: Soft. Bowel sounds are normal. He exhibits no distension. There is no tenderness.  Musculoskeletal: Normal range of motion. He exhibits no edema or tenderness.  Neurological: He is alert and oriented to person, place, and time. He has normal reflexes. No cranial nerve deficit.  Skin: Skin is warm and dry. No rash noted. No erythema.  Psychiatric: He has a normal mood and affect. His behavior is normal. Judgment and thought content normal.  Vitals reviewed.     BP 162/98 mmHg  Pulse 79  Temp(Src) 97.4 F (36.3 C) (Oral)  Ht 5' 6"  (1.676 m)  Wt 193 lb 3.2 oz (87.635 kg)  BMI 31.20 kg/m2     Assessment & Plan:  1. Gastroesophageal reflux disease without esophagitis - CMP14+EGFR - CBC with Differential/Platelet  2. Vitamin D deficienc - CMP14+EGFR - CBC with Differential/Platelet  3. Encounter for screening colonoscopy - Ambulatory referral to Gastroenterology - CMP14+EGFR - CBC with Differential/Platelet  4. Prostate  cancer screening - CMP14+EGFR - PSA, total and free  5. Metabolic syndrome   Continue all meds Labs pending Health Maintenance reviewed- Pt to call to check price of Zoster vaccine and return and get vaccine today Diet and exercise encouraged RTO 3 months to recheck  HTN  Evelina Dun, FNP

## 2015-07-30 LAB — CBC WITH DIFFERENTIAL/PLATELET
Basophils Absolute: 0 10*3/uL (ref 0.0–0.2)
Basos: 0 %
EOS (ABSOLUTE): 0.2 10*3/uL (ref 0.0–0.4)
Eos: 1 %
Hematocrit: 44.8 % (ref 37.5–51.0)
Hemoglobin: 14.7 g/dL (ref 12.6–17.7)
Immature Grans (Abs): 0.1 10*3/uL (ref 0.0–0.1)
Immature Granulocytes: 1 %
LYMPHS ABS: 1.5 10*3/uL (ref 0.7–3.1)
Lymphs: 11 %
MCH: 28.1 pg (ref 26.6–33.0)
MCHC: 32.8 g/dL (ref 31.5–35.7)
MCV: 86 fL (ref 79–97)
MONOS ABS: 1 10*3/uL — AB (ref 0.1–0.9)
Monocytes: 7 %
Neutrophils Absolute: 11 10*3/uL — ABNORMAL HIGH (ref 1.4–7.0)
Neutrophils: 80 %
PLATELETS: 259 10*3/uL (ref 150–379)
RBC: 5.24 x10E6/uL (ref 4.14–5.80)
RDW: 14.5 % (ref 12.3–15.4)
WBC: 13.9 10*3/uL — ABNORMAL HIGH (ref 3.4–10.8)

## 2015-07-30 LAB — PSA, TOTAL AND FREE
PROSTATE SPECIFIC AG, SERUM: 1.4 ng/mL (ref 0.0–4.0)
PSA FREE: 0.14 ng/mL
PSA, Free Pct: 10 %

## 2015-07-30 LAB — CMP14+EGFR
A/G RATIO: 1.4 (ref 1.1–2.5)
ALK PHOS: 79 IU/L (ref 39–117)
ALT: 13 IU/L (ref 0–44)
AST: 15 IU/L (ref 0–40)
Albumin: 4.2 g/dL (ref 3.6–4.8)
BILIRUBIN TOTAL: 0.3 mg/dL (ref 0.0–1.2)
BUN/Creatinine Ratio: 10 (ref 10–22)
BUN: 11 mg/dL (ref 8–27)
CHLORIDE: 99 mmol/L (ref 97–108)
CO2: 27 mmol/L (ref 18–29)
CREATININE: 1.06 mg/dL (ref 0.76–1.27)
Calcium: 9.2 mg/dL (ref 8.6–10.2)
GFR calc Af Amer: 88 mL/min/{1.73_m2} (ref >59)
GFR calc non Af Amer: 76 mL/min/{1.73_m2} (ref >59)
GLOBULIN, TOTAL: 2.9 g/dL (ref 1.5–4.5)
Glucose: 98 mg/dL (ref 65–99)
POTASSIUM: 4.5 mmol/L (ref 3.5–5.2)
SODIUM: 140 mmol/L (ref 134–144)
Total Protein: 7.1 g/dL (ref 6.0–8.5)

## 2015-08-02 ENCOUNTER — Encounter (INDEPENDENT_AMBULATORY_CARE_PROVIDER_SITE_OTHER): Payer: Self-pay | Admitting: *Deleted

## 2015-08-29 ENCOUNTER — Telehealth: Payer: Self-pay | Admitting: Family

## 2015-08-29 ENCOUNTER — Ambulatory Visit: Payer: BLUE CROSS/BLUE SHIELD

## 2015-08-29 NOTE — Telephone Encounter (Signed)
Pt needs to be seen

## 2015-08-29 NOTE — Telephone Encounter (Signed)
appt made

## 2015-08-30 ENCOUNTER — Encounter: Payer: Self-pay | Admitting: Family

## 2015-11-05 ENCOUNTER — Ambulatory Visit: Payer: Self-pay | Admitting: Family

## 2015-11-07 ENCOUNTER — Ambulatory Visit: Payer: Self-pay | Admitting: Family

## 2015-11-08 ENCOUNTER — Encounter: Payer: Self-pay | Admitting: Family

## 2015-11-27 DIAGNOSIS — E119 Type 2 diabetes mellitus without complications: Secondary | ICD-10-CM

## 2015-11-27 HISTORY — DX: Type 2 diabetes mellitus without complications: E11.9

## 2015-12-03 ENCOUNTER — Other Ambulatory Visit: Payer: Self-pay | Admitting: Family Medicine

## 2015-12-03 NOTE — Telephone Encounter (Signed)
Rx ready for pick up. 

## 2015-12-03 NOTE — Telephone Encounter (Signed)
Script for sildenafil ready.

## 2015-12-16 ENCOUNTER — Encounter: Payer: Self-pay | Admitting: Family

## 2015-12-16 ENCOUNTER — Ambulatory Visit (INDEPENDENT_AMBULATORY_CARE_PROVIDER_SITE_OTHER): Payer: BLUE CROSS/BLUE SHIELD | Admitting: Family

## 2015-12-16 VITALS — BP 151/83 | HR 69 | Temp 97.4°F | Ht 66.0 in | Wt 202.0 lb

## 2015-12-16 DIAGNOSIS — S46912D Strain of unspecified muscle, fascia and tendon at shoulder and upper arm level, left arm, subsequent encounter: Secondary | ICD-10-CM

## 2015-12-16 DIAGNOSIS — Z1211 Encounter for screening for malignant neoplasm of colon: Secondary | ICD-10-CM

## 2015-12-16 DIAGNOSIS — I10 Essential (primary) hypertension: Secondary | ICD-10-CM | POA: Diagnosis not present

## 2015-12-16 MED ORDER — HYDROCHLOROTHIAZIDE 25 MG PO TABS: 25.0000 mg | ORAL_TABLET | Freq: Every day | ORAL | Status: DC

## 2015-12-16 NOTE — Progress Notes (Signed)
Subjective:    Patient ID: Thomas Thomas, male    DOB: 08-14-1956, 60 y.o.   MRN: [id number]  Pt presents to the office today with left shoulder pain. PT states he is having spasm that come and go. PT was seen in the Urgent Care and was given naproxen. PT was given Robaxin rx, but states he never started it because he does not want to be sleepy.  Shoulder Pain  The pain is present in the left shoulder. This is a new problem. The current episode started in the past 7 days. There has been no history of extremity trauma. The problem occurs constantly. The problem has been unchanged. The quality of the pain is described as aching. The pain is at a severity of 10/10. The pain is moderate. Pertinent negatives include no inability to bear weight, itching, limited range of motion, numbness or stiffness. He has tried acetaminophen, NSAIDS and rest for the symptoms. The treatment provided mild relief. Family history does not include gout or rheumatoid arthritis.  Back Pain This is a recurrent problem. The current episode started 1 to 4 weeks ago. The problem occurs constantly. The problem is unchanged. The pain is present in the thoracic spine. Pertinent negatives include no headaches or numbness.  Hypertension This is a new problem. The current episode started 1 to 4 weeks ago. The problem is unchanged. The problem is uncontrolled. Pertinent negatives include no anxiety, headaches, palpitations, peripheral edema or shortness of breath. Risk factors for coronary artery disease include obesity, male gender and sedentary lifestyle. Past treatments include beta blockers. The current treatment provides mild improvement. There is no history of kidney disease, CAD/MI, CVA or heart failure. There is no history of sleep apnea.      Review of Systems  Constitutional: Negative.   HENT: Negative.   Respiratory: Negative.  Negative for shortness of breath.   Cardiovascular: Negative.  Negative for palpitations.    Gastrointestinal: Negative.   Endocrine: Negative.   Genitourinary: Negative.   Musculoskeletal: Positive for back pain. Negative for stiffness.  Skin: Negative for itching.  Neurological: Negative.  Negative for numbness and headaches.  Hematological: Negative.   Psychiatric/Behavioral: Negative.   All other systems reviewed and are negative.      Objective:   Physical Exam  Constitutional: He is oriented to person, place, and time. He appears well-developed and well-nourished. No distress.  HENT:  Head: Normocephalic.  Eyes: Pupils are equal, round, and reactive to light. Right eye exhibits no discharge. Left eye exhibits no discharge.  Neck: Normal range of motion. Neck supple. No thyromegaly present.  Cardiovascular: Normal rate, regular rhythm, normal heart sounds and intact distal pulses.   No murmur heard. Pulmonary/Chest: Effort normal and breath sounds normal. No respiratory distress. He has no wheezes.  Abdominal: Soft. Bowel sounds are normal. He exhibits no distension. There is no tenderness.  Musculoskeletal: Normal range of motion. He exhibits no edema or tenderness.  Neurological: He is alert and oriented to person, place, and time. He has normal reflexes. No cranial nerve deficit.  Skin: Skin is warm and dry. No rash noted. No erythema.  Psychiatric: He has a normal mood and affect. His behavior is normal. Judgment and thought content normal.  Vitals reviewed.     BP 152/84 mmHg  Pulse 71  Temp(Src) 97.4 F (36.3 C) (Oral)  Ht 5' 6"  (1.676 m)  Wt 202 lb (91.627 kg)  BMI 32.62 kg/m2     Assessment & Plan:  1. Left  shoulder strain, subsequent encounter -Rest -Ice -Continue with naprosyn with food BID for next 7-10 days - PT told to take Robaxin on Friday day evening (doesn't work Saturday) -ROM exercise discussed  2. Essential hypertension -PT told to stop metoprol ( Pt was only taking one tab daily) -Pt started on HCTZ 25 mg today -Dash diet  information given -Exercise encouraged - Stress Management  -Continue current meds -RTO in 2 weeks  - hydrochlorothiazide (HYDRODIURIL) 25 MG tablet; Take 1 tablet (25 mg total) by mouth daily.  Dispense: 90 tablet; Refill: 1 - BMP8+EGFR  3. Colon cancer screening - Fecal occult blood, imunochemical; Future   Continue all meds Labs pending Health Maintenance reviewed Diet and exercise encouraged RTO 2 weeks to recheck HTN  Evelina Dun, FNP

## 2015-12-16 NOTE — Patient Instructions (Addendum)
Shoulder Sprain A shoulder sprain is a partial or complete tear in one of the tough, fiber-like tissues (ligaments) in the shoulder. The ligaments in the shoulder help to hold the shoulder in place. CAUSES This condition may be caused by:  A fall.  A hit to the shoulder.  A twist of the arm. RISK FACTORS This condition is more likely to develop in:  People who play sports.  People who have problems with balance or coordination. SYMPTOMS Symptoms of this condition include:  Pain when moving the shoulder.  Limited ability to move the shoulder.  Swelling and tenderness on top of the shoulder.  Warmth in the shoulder.  A change in the shape of the shoulder.  Redness or bruising on the shoulder. DIAGNOSIS This condition is diagnosed with a physical exam. During the exam, you may be asked to do simple exercises with your shoulder. You may also have imaging tests, such as X-rays, MRI, or a CT scan. These tests can show how severe the sprain is. TREATMENT This condition may be treated with:  Rest.  Pain medicine.  Ice.  A sling or brace. This is used to keep the arm still while the shoulder is healing.  Physical therapy or rehabilitation exercises. These help to improve the range of motion and strength of the shoulder.  Surgery (rare). Surgery may be needed if the sprain caused a joint to become unstable. Surgery may also be needed to reduce pain. Some people may develop ongoing shoulder pain or lose some range of motion in the shoulder. However, most people do not develop long-term problems. HOME CARE INSTRUCTIONS  Rest.  Take over-the-counter and prescription medicines only as told by your health care provider.  If directed, apply ice to the area:  Put ice in a plastic bag.  Place a towel between your skin and the bag.  Leave the ice on for 20 minutes, 2-3 times per day.  If you were given a shoulder sling or brace:  Wear it as told.  Remove it to shower or  bathe.  Move your arm only as much as told by your health care provider, but keep your hand moving to prevent swelling.  If you were shown how to do any exercises, do them as told by your health care provider.  Keep all follow-up visits as told by your health care provider. This is important. SEEK MEDICAL CARE IF:  Your pain gets worse.  Your pain is not relieved with medicines.  You have increased redness or swelling. SEEK IMMEDIATE MEDICAL CARE IF:  You have a fever.  You cannot move your arm or shoulder.  You develop numbness or tingling in your arms, hands, or fingers.   This information is not intended to replace advice given to you by your health care provider. Make sure you discuss any questions you have with your health care provider.   Document Released: 02/28/2009 Document Revised: 07/03/2015 Document Reviewed: 02/04/2015 Elsevier Interactive Patient Education 2016 [employer]. Hypertension Hypertension, commonly called high blood pressure, is when the force of blood pumping through your arteries is too strong. Your arteries are the blood vessels that carry blood from your heart throughout your body. A blood pressure reading consists of a higher number over a lower number, such as 110/72. The higher number (systolic) is the pressure inside your arteries when your heart pumps. The lower number (diastolic) is the pressure inside your arteries when your heart relaxes. Ideally you want your blood pressure below 120/80. Hypertension  forces your heart to work harder to pump blood. Your arteries may become narrow or stiff. Having untreated or uncontrolled hypertension can cause heart attack, stroke, kidney disease, and other problems. RISK FACTORS Some risk factors for high blood pressure are controllable. Others are not.  Risk factors you cannot control include:   Race. You may be at higher risk if you are African American.  Age. Risk increases with age.  Gender. Men are at  higher risk than women before age 54 years. After age 45, women are at higher risk than men. Risk factors you can control include:  Not getting enough exercise or physical activity.  Being overweight.  Getting too much fat, sugar, calories, or salt in your diet.  Drinking too much alcohol. SIGNS AND SYMPTOMS Hypertension does not usually cause signs or symptoms. Extremely high blood pressure (hypertensive crisis) may cause headache, anxiety, shortness of breath, and nosebleed. DIAGNOSIS To check if you have hypertension, your health care provider will measure your blood pressure while you are seated, with your arm held at the level of your heart. It should be measured at least twice using the same arm. Certain conditions can cause a difference in blood pressure between your right and left arms. A blood pressure reading that is higher than normal on one occasion does not mean that you need treatment. If it is not clear whether you have high blood pressure, you may be asked to return on a different day to have your blood pressure checked again. Or, you may be asked to monitor your blood pressure at home for 1 or more weeks. TREATMENT Treating high blood pressure includes making lifestyle changes and possibly taking medicine. Living a healthy lifestyle can help lower high blood pressure. You may need to change some of your habits. Lifestyle changes may include:  Following the DASH diet. This diet is high in fruits, vegetables, and whole grains. It is low in salt, red meat, and added sugars.  Keep your sodium intake below 2,300 mg per day.  Getting at least 30-45 minutes of aerobic exercise at least 4 times per week.  Losing weight if necessary.  Not smoking.  Limiting alcoholic beverages.  Learning ways to reduce stress. Your health care provider may prescribe medicine if lifestyle changes are not enough to get your blood pressure under control, and if one of the following is true:  You  are 21-13 years of age and your systolic blood pressure is above 140.  You are 2 years of age or older, and your systolic blood pressure is above 150.  Your diastolic blood pressure is above 90.  You have diabetes, and your systolic blood pressure is over 140 or your diastolic blood pressure is over 90.  You have kidney disease and your blood pressure is above 140/90.  You have heart disease and your blood pressure is above 140/90. Your personal target blood pressure may vary depending on your medical conditions, your age, and other factors. HOME CARE INSTRUCTIONS  Have your blood pressure rechecked as directed by your health care provider.   Take medicines only as directed by your health care provider. Follow the directions carefully. Blood pressure medicines must be taken as prescribed. The medicine does not work as well when you skip doses. Skipping doses also puts you at risk for problems.  Do not smoke.   Monitor your blood pressure at home as directed by your health care provider. SEEK MEDICAL CARE IF:   You think you are having  a reaction to medicines taken.  You have recurrent headaches or feel dizzy.  You have swelling in your ankles.  You have trouble with your vision. SEEK IMMEDIATE MEDICAL CARE IF:  You develop a severe headache or confusion.  You have unusual weakness, numbness, or feel faint.  You have severe chest or abdominal pain.  You vomit repeatedly.  You have trouble breathing. MAKE SURE YOU:   Understand these instructions.  Will watch your condition.  Will get help right away if you are not doing well or get worse.   This information is not intended to replace advice given to you by your health care provider. Make sure you discuss any questions you have with your health care provider.   Document Released: 10/12/2005 Document Revised: 02/26/2015 Document Reviewed: 08/04/2013 Elsevier Interactive Patient Education [employer]

## 2015-12-20 ENCOUNTER — Telehealth: Payer: Self-pay | Admitting: Family

## 2015-12-20 NOTE — Telephone Encounter (Signed)
Patient notified to go to pharmacy and ask pharmacist which medications would be best

## 2015-12-23 ENCOUNTER — Emergency Department (HOSPITAL_COMMUNITY): Payer: BLUE CROSS/BLUE SHIELD

## 2015-12-23 ENCOUNTER — Encounter (HOSPITAL_COMMUNITY): Payer: Self-pay | Admitting: Emergency Medicine

## 2015-12-23 ENCOUNTER — Inpatient Hospital Stay (HOSPITAL_COMMUNITY)
Admission: EM | Admit: 2015-12-23 | Discharge: 2016-01-09 | DRG: 228 | Disposition: A | Discharge status: Home Health | Payer: BLUE CROSS/BLUE SHIELD | Attending: Cardiothoracic Surgery | Admitting: Cardiothoracic Surgery

## 2015-12-23 DIAGNOSIS — J9588 Other intraoperative complications of respiratory system, not elsewhere classified: Secondary | ICD-10-CM | POA: Diagnosis present

## 2015-12-23 DIAGNOSIS — A419 Sepsis, unspecified organism: Secondary | ICD-10-CM | POA: Diagnosis not present

## 2015-12-23 DIAGNOSIS — I509 Heart failure, unspecified: Secondary | ICD-10-CM

## 2015-12-23 DIAGNOSIS — J811 Chronic pulmonary edema: Secondary | ICD-10-CM

## 2015-12-23 DIAGNOSIS — I714 Abdominal aortic aneurysm, without rupture, unspecified: Secondary | ICD-10-CM | POA: Diagnosis present

## 2015-12-23 DIAGNOSIS — I2510 Atherosclerotic heart disease of native coronary artery without angina pectoris: Secondary | ICD-10-CM | POA: Diagnosis present

## 2015-12-23 DIAGNOSIS — Z8249 Family history of ischemic heart disease and other diseases of the circulatory system: Secondary | ICD-10-CM

## 2015-12-23 DIAGNOSIS — I5031 Acute diastolic (congestive) heart failure: Secondary | ICD-10-CM | POA: Diagnosis present

## 2015-12-23 DIAGNOSIS — I214 Non-ST elevation (NSTEMI) myocardial infarction: Secondary | ICD-10-CM

## 2015-12-23 DIAGNOSIS — R0600 Dyspnea, unspecified: Secondary | ICD-10-CM

## 2015-12-23 DIAGNOSIS — D62 Acute posthemorrhagic anemia: Secondary | ICD-10-CM | POA: Diagnosis not present

## 2015-12-23 DIAGNOSIS — I110 Hypertensive heart disease with heart failure: Secondary | ICD-10-CM | POA: Diagnosis present

## 2015-12-23 DIAGNOSIS — J9601 Acute respiratory failure with hypoxia: Secondary | ICD-10-CM | POA: Diagnosis not present

## 2015-12-23 DIAGNOSIS — I249 Acute ischemic heart disease, unspecified: Secondary | ICD-10-CM | POA: Diagnosis not present

## 2015-12-23 DIAGNOSIS — F419 Anxiety disorder, unspecified: Secondary | ICD-10-CM | POA: Diagnosis present

## 2015-12-23 DIAGNOSIS — I255 Ischemic cardiomyopathy: Secondary | ICD-10-CM | POA: Diagnosis present

## 2015-12-23 DIAGNOSIS — R9431 Abnormal electrocardiogram [ECG] [EKG]: Secondary | ICD-10-CM | POA: Diagnosis not present

## 2015-12-23 DIAGNOSIS — J189 Pneumonia, unspecified organism: Secondary | ICD-10-CM | POA: Diagnosis not present

## 2015-12-23 DIAGNOSIS — R0602 Shortness of breath: Secondary | ICD-10-CM

## 2015-12-23 DIAGNOSIS — Z79899 Other long term (current) drug therapy: Secondary | ICD-10-CM | POA: Diagnosis not present

## 2015-12-23 DIAGNOSIS — Z87891 Personal history of nicotine dependence: Secondary | ICD-10-CM | POA: Diagnosis not present

## 2015-12-23 DIAGNOSIS — N179 Acute kidney failure, unspecified: Secondary | ICD-10-CM | POA: Diagnosis present

## 2015-12-23 DIAGNOSIS — Z9689 Presence of other specified functional implants: Secondary | ICD-10-CM

## 2015-12-23 DIAGNOSIS — Z4659 Encounter for fitting and adjustment of other gastrointestinal appliance and device: Secondary | ICD-10-CM

## 2015-12-23 DIAGNOSIS — R079 Chest pain, unspecified: Secondary | ICD-10-CM | POA: Diagnosis not present

## 2015-12-23 DIAGNOSIS — E876 Hypokalemia: Secondary | ICD-10-CM | POA: Diagnosis present

## 2015-12-23 DIAGNOSIS — J810 Acute pulmonary edema: Secondary | ICD-10-CM | POA: Diagnosis not present

## 2015-12-23 DIAGNOSIS — Z452 Encounter for adjustment and management of vascular access device: Secondary | ICD-10-CM

## 2015-12-23 DIAGNOSIS — A400 Sepsis due to streptococcus, group A: Secondary | ICD-10-CM | POA: Diagnosis not present

## 2015-12-23 DIAGNOSIS — M549 Dorsalgia, unspecified: Secondary | ICD-10-CM | POA: Diagnosis present

## 2015-12-23 DIAGNOSIS — J939 Pneumothorax, unspecified: Secondary | ICD-10-CM

## 2015-12-23 DIAGNOSIS — I25119 Atherosclerotic heart disease of native coronary artery with unspecified angina pectoris: Secondary | ICD-10-CM | POA: Insufficient documentation

## 2015-12-23 DIAGNOSIS — Z951 Presence of aortocoronary bypass graft: Secondary | ICD-10-CM

## 2015-12-23 DIAGNOSIS — E119 Type 2 diabetes mellitus without complications: Secondary | ICD-10-CM | POA: Diagnosis present

## 2015-12-23 HISTORY — DX: Non-ST elevation (NSTEMI) myocardial infarction: I21.4

## 2015-12-23 LAB — I-STAT CHEM 8, ED
BUN: 19 mg/dL (ref 6–20)
CHLORIDE: 90 mmol/L — AB (ref 101–111)
CREATININE: 1.1 mg/dL (ref 0.61–1.24)
Calcium, Ion: 1.03 mmol/L — ABNORMAL LOW (ref 1.13–1.30)
Glucose, Bld: 138 mg/dL — ABNORMAL HIGH (ref 65–99)
HEMATOCRIT: 52 % (ref 39.0–52.0)
Hemoglobin: 17.7 g/dL — ABNORMAL HIGH (ref 13.0–17.0)
POTASSIUM: 3.7 mmol/L (ref 3.5–5.1)
SODIUM: 132 mmol/L — AB (ref 135–145)
TCO2: 30 mmol/L (ref 0–100)

## 2015-12-23 LAB — PROTIME-INR
INR: 1.16 (ref 0.00–1.49)
PROTHROMBIN TIME: 15 s (ref 11.6–15.2)

## 2015-12-23 LAB — BASIC METABOLIC PANEL
Anion gap: 14 (ref 5–15)
BUN: 16 mg/dL (ref 6–20)
CHLORIDE: 92 mmol/L — AB (ref 101–111)
CO2: 27 mmol/L (ref 22–32)
CREATININE: 1.31 mg/dL — AB (ref 0.61–1.24)
Calcium: 8.8 mg/dL — ABNORMAL LOW (ref 8.9–10.3)
GFR calc Af Amer: >60 mL/min (ref >60)
GFR calc non Af Amer: 57 mL/min — ABNORMAL LOW (ref >60)
GLUCOSE: 144 mg/dL — AB (ref 65–99)
Potassium: 3.3 mmol/L — ABNORMAL LOW (ref 3.5–5.1)
SODIUM: 133 mmol/L — AB (ref 135–145)

## 2015-12-23 LAB — BRAIN NATRIURETIC PEPTIDE: B NATRIURETIC PEPTIDE 5: 77 pg/mL (ref 0.0–100.0)

## 2015-12-23 LAB — CBC
HCT: 46.2 % (ref 39.0–52.0)
HEMOGLOBIN: 15.3 g/dL (ref 13.0–17.0)
MCH: 28.9 pg (ref 26.0–34.0)
MCHC: 33.1 g/dL (ref 30.0–36.0)
MCV: 87.2 fL (ref 78.0–100.0)
Platelets: 191 10*3/uL (ref 150–400)
RBC: 5.3 MIL/uL (ref 4.22–5.81)
RDW: 14.6 % (ref 11.5–15.5)
WBC: 27.3 10*3/uL — ABNORMAL HIGH (ref 4.0–10.5)

## 2015-12-23 LAB — I-STAT TROPONIN, ED: Troponin i, poc: 1.68 ng/mL (ref 0.00–0.08)

## 2015-12-23 LAB — I-STAT CG4 LACTIC ACID, ED: Lactic Acid, Venous: 2.46 mmol/L (ref 0.5–2.0)

## 2015-12-23 LAB — TROPONIN I: TROPONIN I: 3.06 ng/mL — AB (ref <0.031)

## 2015-12-23 MED ORDER — DEXTROSE 5 % IV SOLN: 500.0000 mg | INTRAVENOUS | Status: DC

## 2015-12-23 MED ORDER — METOPROLOL TARTRATE 1 MG/ML IV SOLN
5.0000 mg | Freq: Once | INTRAVENOUS | Status: AC
  Administered 2015-12-23: 5 mg via INTRAVENOUS
  Filled 2015-12-23: qty 5

## 2015-12-23 MED ORDER — IOHEXOL 350 MG/ML SOLN
100.0000 mL | Freq: Once | INTRAVENOUS | Status: AC | PRN
  Administered 2015-12-23: 100 mL via INTRAVENOUS

## 2015-12-23 MED ORDER — SODIUM CHLORIDE 0.9 % IV BOLUS (SEPSIS)
1000.0000 mL | Freq: Once | INTRAVENOUS | Status: AC
  Administered 2015-12-23: 1000 mL via INTRAVENOUS

## 2015-12-23 MED ORDER — AZITHROMYCIN 500 MG IV SOLR
500.0000 mg | Freq: Once | INTRAVENOUS | Status: AC
  Administered 2015-12-23: 500 mg via INTRAVENOUS
  Filled 2015-12-23: qty 500

## 2015-12-23 MED ORDER — HEPARIN (PORCINE) IN NACL 100-0.45 UNIT/ML-% IJ SOLN
1800.0000 [IU]/h | INTRAMUSCULAR | Status: DC
  Administered 2015-12-23: 1300 [IU]/h via INTRAVENOUS
  Filled 2015-12-23 (×3): qty 250

## 2015-12-23 MED ORDER — CEFTRIAXONE SODIUM 1 G IJ SOLR
1.0000 g | Freq: Once | INTRAMUSCULAR | Status: AC
  Administered 2015-12-23: 1 g via INTRAVENOUS
  Filled 2015-12-23: qty 10

## 2015-12-23 MED ORDER — ACETAMINOPHEN 500 MG PO TABS
1000.0000 mg | ORAL_TABLET | Freq: Once | ORAL | Status: AC
  Administered 2015-12-23: 1000 mg via ORAL
  Filled 2015-12-23: qty 2

## 2015-12-23 MED ORDER — METOPROLOL TARTRATE 25 MG PO TABS
25.0000 mg | ORAL_TABLET | Freq: Two times a day (BID) | ORAL | Status: DC
  Administered 2015-12-23 – 2015-12-25 (×4): 25 mg via ORAL
  Filled 2015-12-23 (×5): qty 1

## 2015-12-23 MED ORDER — METOPROLOL TARTRATE 1 MG/ML IV SOLN
INTRAVENOUS | Status: AC
  Filled 2015-12-23: qty 5

## 2015-12-23 MED ORDER — SODIUM CHLORIDE 0.9 % IV SOLN: 1000.0000 mL | INTRAVENOUS | Status: DC

## 2015-12-23 MED ORDER — PANTOPRAZOLE SODIUM 40 MG PO TBEC
40.0000 mg | DELAYED_RELEASE_TABLET | Freq: Every day | ORAL | Status: DC
  Administered 2015-12-23 – 2015-12-25 (×3): 40 mg via ORAL
  Filled 2015-12-23 (×3): qty 1

## 2015-12-23 MED ORDER — IBUPROFEN 800 MG PO TABS
800.0000 mg | ORAL_TABLET | Freq: Once | ORAL | Status: AC
  Administered 2015-12-23: 800 mg via ORAL
  Filled 2015-12-23: qty 1

## 2015-12-23 MED ORDER — HEPARIN BOLUS VIA INFUSION
4000.0000 [IU] | Freq: Once | INTRAVENOUS | Status: AC
  Administered 2015-12-23: 4000 [IU] via INTRAVENOUS

## 2015-12-23 MED ORDER — CEFTRIAXONE SODIUM 1 G IJ SOLR
1.0000 g | INTRAMUSCULAR | Status: AC
  Administered 2015-12-24 – 2015-12-31 (×8): 1 g via INTRAVENOUS
  Filled 2015-12-23 (×11): qty 10

## 2015-12-23 MED ORDER — ASPIRIN 81 MG PO CHEW
324.0000 mg | CHEWABLE_TABLET | Freq: Once | ORAL | Status: AC
  Administered 2015-12-23: 324 mg via ORAL
  Filled 2015-12-23: qty 4

## 2015-12-23 MED ORDER — ATORVASTATIN CALCIUM 40 MG PO TABS
80.0000 mg | ORAL_TABLET | Freq: Every day | ORAL | Status: DC
  Administered 2015-12-24 – 2016-01-08 (×16): 80 mg via ORAL
  Filled 2015-12-23 (×21): qty 2

## 2015-12-23 MED ORDER — DEXTROSE 5 % IV SOLN
500.0000 mg | INTRAVENOUS | Status: DC
  Administered 2015-12-24 – 2015-12-26 (×3): 500 mg via INTRAVENOUS
  Filled 2015-12-23 (×5): qty 500

## 2015-12-23 NOTE — Progress Notes (Addendum)
Pharmacy Antibiotic Note  Thomas Thomas is a 60 y.o. male admitted on 12/23/2015 with pneumonia.  Pharmacy has been consulted for azithromycin and rocephin dosing.  Plan: Azithromycin 500 mg IV q24 hours Rocephin 1 gm IV q24 hours F/u cultures and clinical course  Height:  (167.6 cm) Weight: 202 lb (91.627 kg) IBW/kg (Calculated) : 63.8  Temp (24hrs), Avg:99 F (37.2 C), Min:99 F (37.2 C), Max:99 F (37.2 C)   Recent Labs Lab 12/23/15 1932 12/23/15 1936  WBC 27.3*  --   CREATININE  --  1.10  LATICACIDVEN  --  2.46*    Estimated Creatinine Clearance: 74.7 mL/min (by C-G formula based on Cr of 1.1).    No Known Allergies  Antimicrobials this admission: azithromyciin 2/27 >>  Rocephin   2/27 >>     Thank you for allowing pharmacy to be a part of this patient's care.  Woodfin Ganja 12/23/2015 8:17 PM  Addum:  Start heparin for ACS.  Heparin 4000 unit bolus and drip at 1300 units/hr.  Daily heparin level and CBC

## 2015-12-23 NOTE — ED Notes (Signed)
Report given to [facility]- Tasia Catchings

## 2015-12-23 NOTE — ED Notes (Signed)
CRITICAL VALUE ALERT  Critical value received:  Troponin 3.06  Date of notification:  12/23/2015  Time of notification:  2043  Critical value read back:Yes.    Nurse who received alert:  Bronson Curb, Rn  MD notified (1st page):  Dr Manus Gunning  Time of first page:  2043  MD notified (2nd page):  Time of second page:  Responding MD:  Dr Manus Gunning  Time MD responded:  2043

## 2015-12-23 NOTE — ED Notes (Signed)
MD at bedside. 

## 2015-12-23 NOTE — ED Notes (Signed)
BP in RT arm 139/77, LT arm 150/85. Dr. Lajean Saver made aware.

## 2015-12-23 NOTE — Consult Note (Addendum)
Cardiology Consult    Patient ID: Thomas Thomas MRN: [id number], DOB/AGE: 1956-04-09   Admit date: 12/23/2015 Date of Consult: 12/23/2015  Primary Physician: Jannifer Rodney, FNP Primary Cardiologist: None Requesting Provider: Dr. Manus Gunning  Patient Profile    [phone] with HTN and anxiety who presents with PNA, lactic acidosis, and NSTEMI.   Past Medical History   Past Medical History  Diagnosis Date  . Hypertension   . Anxiety     Past Surgical History  Procedure Laterality Date  . Knee surgery    . Hernia repair       Allergies  No Known Allergies  History of Present Illness    60M with HTN and anxiety who presents with PNA, lactic acidosis, and NSTEMI.   Thomas Thomas reports that a week ago he "came down with something." This was made worse by the fact that he re-injured an old left back injury from a few years back and this seemed to be exacerbated by this illness. He went to urgent care where he was given a muscle relaxant, naproxen, and metoprolol due to being hypertensive and tachycardic at the time. He developed upper respiratory symptoms and eventually saw his PCP a few days later and was given a nasal decongestant and an expectorant which he used for a few days. He had cough productive of grey phegm and worsening of his back pain, which radiated around the left side and to his left breast. Pain was improved with palpation and not made worse with ambulation or breathing. He continued to feel poorly with decreased energy and came to the [facility] ER today for evaluation. No palpitations, LE edema, SOB, syncope, PND, or orthopnea.   On arrival to the ER, he was tachycardic (130bpm), afebrile, and hypertensive (158/75) saturating 96% on RA. ECG demonstrated ST @ 124bpm, anterior, lateral, and apical (V5/6) ST depression (nearly 3mm in some leads) with ~42mm STE in aVR. Compared to prior on 06/12/13, ST and ST segment deviation is new.  Labs were notable for K 3.3, Cr 1.31, WBC 27.3, lactate  2.46, POC TnI 1.58. A CXR demonstrated a retrocardiac LLL opacity consistent with pneumonia. A CT angiogram was obtained and confirmed the presence of pneumonia and ruled out pulmonary embolism and aortic dissection; it also demonstrated a small fusiform dilation of the infrarenal abdominal aorta measuring 2.4 cm. He was given IV fluids and started on azithromycin and rocephin. After some time in the ER, the patient spiked a fever to 103.40F.   He has no personal or family history of CAD (3 sisters, deceased father, and mother are without history of CAD; mother does have a pacemaker.)  No history of stress tests, bleeding problems, or cardiac catheterizations. No sick contacts. He did get the flux vaccine. 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 Rare alcohol and no illicits.  Reports total cholesterol of 140 a few months ago.   Inpatient Medications       Family History    Family History  Problem Relation Age of Onset  . Heart disease Mother   . Cancer Father     stomach    Social History    Social History   Social History  . Marital Status: Married    Spouse Name: N/A  . Number of Children: N/A  . Years of Education: N/A   Occupational History  . Not on file.   Social History Main Topics  . Smoking status: Former Smoker  Quit date: 06/06/2012  . Smokeless tobacco: Not on file  . Alcohol Use: No  . Drug Use: No  . Sexual Activity: Not on file   Other Topics Concern  . Not on file   Social History Narrative     Review of Systems    General:  See HPI Cardiovascular:  +_chest pain. No dyspnea on exertion, edema, orthopnea, palpitations, paroxysmal nocturnal dyspnea. Dermatological: No rash, lesions/masses Respiratory: + productive cough,. No dyspnea Urologic: No hematuria, dysuria Abdominal:   No nausea, vomiting, diarrhea, bright red blood per rectum, melena, or hematemesis. Poor appetite.  Neurologic:  No visual changes, wkns,  changes in mental status. All other systems reviewed and are otherwise negative except as noted above.  Physical Exam    Blood pressure 104/69, pulse 93, temperature 101.7 F (38.7 C), temperature source Rectal, resp. rate 14, height 5\' 6"  (1.676 m), weight 91.627 kg (202 lb), SpO2 98 %.  General: Pleasant, NAD Psych: Normal affect. Neuro: Alert and oriented X 3. Moves all extremities spontaneously. HEENT: Normal  Neck: Supple without bruits or JVD. Lungs:  Resp regular and unlabored, CTA. Heart: RRR no s3, s4, or murmurs. Abdomen: Soft, non-tender, non-distended, BS + x 4.  Extremities: No clubbing, cyanosis or edema. DP/PT/Radials 2+ and equal bilaterally.  Labs    Troponin [facility] of Care Test)  Recent Labs  12/23/15 1940  TROPIPOC 1.68*   No results for input(s): CKTOTAL, CKMB, TROPONINI in the last 72 hours. Lab Results  Component Value Date   WBC 27.3* 12/23/2015   HGB 17.7* 12/23/2015   HCT 52.0 12/23/2015   MCV 87.2 12/23/2015   PLT 191 12/23/2015     Recent Labs Lab 12/23/15 1932 12/23/15 1936  NA 133* 132*  K 3.3* 3.7  CL 92* 90*  CO2 27  --   BUN 16 19  CREATININE 1.31* 1.10  CALCIUM 8.8*  --   GLUCOSE 144* 138*   Lab Results  Component Value Date   CHOL 140 07/03/2014   HDL 39* 07/03/2014   LDLCALC 92 07/03/2014   TRIG 47 07/03/2014   No results found for: [facility]   Radiology Studies    Dg Chest Portable 1 View  12/23/2015  CLINICAL DATA:  Pt c/o productive cough and Lt shoulder pain x 2 wks. Hx former smoker, HTN, anxiety EXAM: PORTABLE CHEST 1 VIEW COMPARISON:  06/12/2013 FINDINGS: There is focal opacity in the retrocardiac region of the left lower lobe, new since the prior exam. Remainder of the lungs is clear. No convincing pleural effusion. No pneumothorax. Normal heart, mediastinum and hila. Skeletal structures are unremarkable. IMPRESSION: Left lower lobe pneumonia. Electronically Signed   By: Amie Portland M.D.   On: 12/23/2015 19:31     ECG & Cardiac Imaging    12/23/15 @ 20:08: ST @ 124bpm, anterior, lateral, and apical (V5/6) ST depression (nearly 3mm in some leads) with ~56mm STE in aVR. Compared to prior on 06/12/13, ST and ST segment deviation is new.    Assessment & Plan    70M with HTN, anxiety, who presents with cough and lightheadedness who has LLL PNA, lactic acidosis, and NSTEMI.   NSTEMI  Unclear if type I MI, type II MI in setting of sepsis, or possibly a myocarditis. PE ruled out by CT angiogram. - continue heparin infusion - TTE in AM - ASA 81mg  daily - Hold atorvastatin at this point given sepsis and while awaiting results of LFTs - It is reasonable to consider a low  dose beta blocker given relatively normal BP since arrival to ER in setting of MI (metoprolol 12.5mg  BID) but tachycardia should be treated primarily by treating the underlying causes of infection, hypvolemia, and fever, with IVF, antibiotics, and antipyretics  - He will likely require angiography for risk stratification later in the hospitalization after he has been stabilized from an infectious standpoint - A1c, lipids - Recheck ECG - continue to cycle troponins  Fevers and LLL PNA - would r/o for influenza - antibiotics per primary team  Signed, Glori Luis, MD 12/23/2015, 8:21 PM

## 2015-12-23 NOTE — ED Provider Notes (Signed)
CSN: [id number]     Arrival date & time 12/23/15  1900 History   First MD Initiated Contact with Patient 12/23/15 1918     Chief Complaint  Patient presents with  . Dizziness     (Consider location/radiation/quality/duration/timing/severity/associated sxs/prior Treatment) HPI Comments: Patient presents to the ED with 2 weeks of left scapular pain that is constant. States he "tweaked his arm" and believes the pain is due to that. He was seen by urgent care today for cough and congestion and given Levaquin for sinusitis but he has not started that yet. He's had lightheadedness and dizziness over the past 4 days. Denies any chest pain, shortness of breath, abdominal pain, nausea or vomiting. EKG at arrival is very abnormal with global ST depressions and elevation aVR. Level V caveat for acuity of condition.  The history is provided by the patient.    Past Medical History  Diagnosis Date  . Hypertension   . Anxiety    Past Surgical History  Procedure Laterality Date  . Knee surgery    . Hernia repair     Family History  Problem Relation Age of Onset  . Heart disease Mother   . Cancer Father     stomach   Social History  Substance Use Topics  . Smoking status: Former Smoker    Quit date: 06/06/2012  . Smokeless tobacco: None  . Alcohol Use: No    Review of Systems  Constitutional: Positive for fever, chills, activity change, appetite change and fatigue.  HENT: Positive for congestion and rhinorrhea.   Eyes: Negative for visual disturbance.  Respiratory: Positive for cough. Negative for chest tightness and shortness of breath.   Cardiovascular: Negative for chest pain and leg swelling.  Gastrointestinal: Negative for nausea, vomiting and abdominal pain.  Genitourinary: Negative for dysuria, hematuria and testicular pain.  Musculoskeletal: Positive for myalgias, back pain and arthralgias.  Neurological: Negative for dizziness, weakness, light-headedness, numbness and  headaches.  A complete 10 system review of systems was obtained and all systems are negative except as noted in the HPI and PMH.      Allergies  Review of patient's allergies indicates no known allergies.  Home Medications   Prior to Admission medications   Medication Sig Start Date End Date Taking? Authorizing Provider  hydrochlorothiazide (HYDRODIURIL) 25 MG tablet Take 1 tablet (25 mg total) by mouth daily. 12/16/15  Yes Junie Spencer, FNP  naproxen (NAPROSYN) 375 MG tablet Take 375 mg by mouth 2 (two) times daily with a meal. Reported on 12/16/2015 12/08/15  Yes Historical Provider, MD   BP 104/69 mmHg  Pulse 93  Temp(Src) 101.7 F (38.7 C) (Rectal)  Resp 14  Ht 5\' 6"  (1.676 m)  Wt 202 lb (91.627 kg)  BMI 32.62 kg/m2  SpO2 98% Physical Exam  Constitutional: He is oriented to person, place, and time. He appears well-developed and well-nourished. No distress.  HENT:  Head: Normocephalic and atraumatic.  Mouth/Throat: Oropharynx is clear and moist. No oropharyngeal exudate.  Eyes: Conjunctivae and EOM are normal. Pupils are equal, round, and reactive to light.  Neck: Normal range of motion. Neck supple.  No meningismus.  Cardiovascular: Normal rate, regular rhythm, normal heart sounds and intact distal pulses.   No murmur heard. Tachycardic to 130s  Pulmonary/Chest: Effort normal and breath sounds normal. No respiratory distress.  Abdominal: Soft. There is no tenderness. There is no rebound and no guarding.  Musculoskeletal: Normal range of motion. He exhibits tenderness. He exhibits no edema.  TTP L paraspinal thoracic back medial to scapula  Neurological: He is alert and oriented to person, place, and time. No cranial nerve deficit. He exhibits normal muscle tone. Coordination normal.  No ataxia on finger to nose bilaterally. No pronator drift. 5/5 strength throughout. CN 2-12 intact.Equal grip strength. Sensation intact.   Skin: Skin is warm. He is diaphoretic.   Psychiatric: He has a normal mood and affect. His behavior is normal.  Nursing note and vitals reviewed.   ED Course  Procedures (including critical care time) Labs Review Labs Reviewed  CBC - Abnormal; Notable for the following:    WBC 27.3 (*)    All other components within normal limits  BASIC METABOLIC PANEL - Abnormal; Notable for the following:    Sodium 133 (*)    Potassium 3.3 (*)    Chloride 92 (*)    Glucose, Bld 144 (*)    Creatinine, Ser 1.31 (*)    Calcium 8.8 (*)    GFR calc non Af Amer 57 (*)    All other components within normal limits  TROPONIN I - Abnormal; Notable for the following:    Troponin I 3.06 (*)    All other components within normal limits  I-STAT CG4 LACTIC ACID, ED - Abnormal; Notable for the following:    Lactic Acid, Venous 2.46 (*)    All other components within normal limits  I-STAT CHEM 8, ED - Abnormal; Notable for the following:    Sodium 132 (*)    Chloride 90 (*)    Glucose, Bld 138 (*)    Calcium, Ion 1.03 (*)    Hemoglobin 17.7 (*)    All other components within normal limits  I-STAT TROPOININ, ED - Abnormal; Notable for the following:    Troponin i, poc 1.68 (*)    All other components within normal limits  CULTURE, BLOOD (ROUTINE X 2)  CULTURE, BLOOD (ROUTINE X 2)  URINE CULTURE  BRAIN NATRIURETIC PEPTIDE  PROTIME-INR  URINALYSIS, ROUTINE W REFLEX MICROSCOPIC (NOT AT [facility])  INFLUENZA PANEL BY PCR (TYPE A & B, H1N1)  HEPARIN LEVEL (UNFRACTIONATED)  CBC  I-STAT CG4 LACTIC ACID, ED  I-STAT CG4 LACTIC ACID, ED  I-STAT CG4 LACTIC ACID, ED  I-STAT CG4 LACTIC ACID, ED  I-STAT CG4 LACTIC ACID, ED    Imaging Review Dg Chest Portable 1 View  12/23/2015  CLINICAL DATA:  Pt c/o productive cough and Lt shoulder pain x 2 wks. Hx former smoker, HTN, anxiety EXAM: PORTABLE CHEST 1 VIEW COMPARISON:  06/12/2013 FINDINGS: There is focal opacity in the retrocardiac region of the left lower lobe, new since the prior exam. Remainder of the  lungs is clear. No convincing pleural effusion. No pneumothorax. Normal heart, mediastinum and hila. Skeletal structures are unremarkable. IMPRESSION: Left lower lobe pneumonia. Electronically Signed   By: Amie Portland M.D.   On: 12/23/2015 19:31   Ct Angio Chest Aorta W/cm &/or Wo/cm  12/23/2015  CLINICAL DATA:  Dissection protocol used. Left shoulder pain-radiating to left chest wall, lightheaded, lower back pain x 4 days, chest congestion x 1 week. Denies sob. History of HTN, hernia repair. EXAM: CT ANGIOGRAPHY CHEST, ABDOMEN AND PELVIS TECHNIQUE: Multidetector CT imaging through the chest, abdomen and pelvis was performed using the standard protocol during bolus administration of intravenous contrast. Multiplanar reconstructed images and MIPs were obtained and reviewed to evaluate the vascular anatomy. CONTRAST:  OMNIPAQUE IOHEXOL 350 MG/ML SOLN COMPARISON:  Current chest radiograph FINDINGS: CTA CHEST FINDINGS Stop thoracic  aorta is normal in caliber. There is no dissection. Minor partly calcified plaque is noted along the aortic arch and descending thoracic aorta and at the origin of the left subclavian artery. No significant stenosis. Review of the MIP images confirms the above findings. CTA ABDOMEN AND PELVIS FINDINGS Abdominal aorta shows a small fusiform dilation in its infrarenal portion, mass in diameter of 2.4 cm iliac arteries are normal in caliber. There is mild atherosclerotic calcifications along the infrarenal abdominal aorta and iliac vessels. Celiac axis, superior mesenteric artery and single renal arteries bilaterally are widely patent. The inferior mesenteric artery is patent. CT CHEST NON ANGIOGRAPHIC FINDINGS Neck base and axilla:  No mass or adenopathy.  Normal thyroid. Mediastinum and hila: Heart is unremarkable. No mediastinal or hilar masses or significant enlarged lymph nodes. There are several prominent lymph nodes, largest a subcarinal node measuring 11 mm in short axis.  Lungs and pleura: Left lower lobe consolidation consistent with pneumonia as noted on the current chest radiograph. Subtle opacity in the posterior medial right lower lobe is likely atelectasis although could reflect additional infection. Remainder of the lungs is clear. No pleural effusion or pneumothorax. CT ABDOMEN AND PELVIS NON ANGIOGRAPHIC FINDINGS Liver, spleen, gallbladder, pancreas, adrenal glands:  Normal. Kidneys, ureters, bladder: 17 mm exophytic low-density upper pole right renal mass consistent with a cyst. 6 mm hyper attenuating mass protruding from the posterior margin of the right kidney midpole, nonspecific. No other renal masses or lesions. No hydronephrosis. Normal ureters. Bladder is unremarkable. Lymph nodes:  No adenopathy. Ascites:  None. Gastrointestinal:  Unremarkable.  No appendix visualized. MUSCULOSKELETAL FINDINGS Mild degenerative endplate spurring along the lower thoracic spine. No osteoblastic or osteolytic lesions. Review of the MIP images confirms the above findings. IMPRESSION: 1. No evidence of an aortic dissection. 2. Small fusiform dilation of the infrarenal abdominal aorta measuring 2.4 cm. Consider follow-up with ultrasound in 1 year to reassess size. 3. Left lower lobe consolidation consistent with pneumonia as noted on the current chest radiograph. 4. No other acute findings within the chest, abdomen or pelvis. Electronically Signed   By: Amie Portland M.D.   On: 12/23/2015 20:34   Ct Cta Abd/pel W/cm &/or W/o Cm  12/23/2015  CLINICAL DATA:  Dissection protocol used. Left shoulder pain-radiating to left chest wall, lightheaded, lower back pain x 4 days, chest congestion x 1 week. Denies sob. History of HTN, hernia repair. EXAM: CT ANGIOGRAPHY CHEST, ABDOMEN AND PELVIS TECHNIQUE: Multidetector CT imaging through the chest, abdomen and pelvis was performed using the standard protocol during bolus administration of intravenous contrast. Multiplanar reconstructed images and  MIPs were obtained and reviewed to evaluate the vascular anatomy. CONTRAST:  OMNIPAQUE IOHEXOL 350 MG/ML SOLN COMPARISON:  Current chest radiograph FINDINGS: CTA CHEST FINDINGS Stop thoracic aorta is normal in caliber. There is no dissection. Minor partly calcified plaque is noted along the aortic arch and descending thoracic aorta and at the origin of the left subclavian artery. No significant stenosis. Review of the MIP images confirms the above findings. CTA ABDOMEN AND PELVIS FINDINGS Abdominal aorta shows a small fusiform dilation in its infrarenal portion, mass in diameter of 2.4 cm iliac arteries are normal in caliber. There is mild atherosclerotic calcifications along the infrarenal abdominal aorta and iliac vessels. Celiac axis, superior mesenteric artery and single renal arteries bilaterally are widely patent. The inferior mesenteric artery is patent. CT CHEST NON ANGIOGRAPHIC FINDINGS Neck base and axilla:  No mass or adenopathy.  Normal thyroid. Mediastinum and hila:  Heart is unremarkable. No mediastinal or hilar masses or significant enlarged lymph nodes. There are several prominent lymph nodes, largest a subcarinal node measuring 11 mm in short axis. Lungs and pleura: Left lower lobe consolidation consistent with pneumonia as noted on the current chest radiograph. Subtle opacity in the posterior medial right lower lobe is likely atelectasis although could reflect additional infection. Remainder of the lungs is clear. No pleural effusion or pneumothorax. CT ABDOMEN AND PELVIS NON ANGIOGRAPHIC FINDINGS Liver, spleen, gallbladder, pancreas, adrenal glands:  Normal. Kidneys, ureters, bladder: 17 mm exophytic low-density upper pole right renal mass consistent with a cyst. 6 mm hyper attenuating mass protruding from the posterior margin of the right kidney midpole, nonspecific. No other renal masses or lesions. No hydronephrosis. Normal ureters. Bladder is unremarkable. Lymph nodes:  No adenopathy.  Ascites:  None. Gastrointestinal:  Unremarkable.  No appendix visualized. MUSCULOSKELETAL FINDINGS Mild degenerative endplate spurring along the lower thoracic spine. No osteoblastic or osteolytic lesions. Review of the MIP images confirms the above findings. IMPRESSION: 1. No evidence of an aortic dissection. 2. Small fusiform dilation of the infrarenal abdominal aorta measuring 2.4 cm. Consider follow-up with ultrasound in 1 year to reassess size. 3. Left lower lobe consolidation consistent with pneumonia as noted on the current chest radiograph. 4. No other acute findings within the chest, abdomen or pelvis. Electronically Signed   By: Amie Portland M.D.   On: 12/23/2015 20:34   I have personally reviewed and evaluated these images and lab results as part of my medical decision-making.   EKG Interpretation   Date/Time:  Monday December 23 2015 20:08:46 EST Ventricular Rate:  124 PR Interval:  110 QRS Duration: 90 QT Interval:  290 QTC Calculation: 416 R Axis:   82 Text Interpretation:  Sinus tachycardia Borderline right axis deviation  Repol abnrm suggests ischemia, diffuse leads global ST depression, ST  elevation aVR Confirmed by Manus Gunning  MD, Selicia Windom [phone]) on 12/23/2015  8:29:19 PM      MDM   Final diagnoses:  Sepsis, due to unspecified organism [facility])  NSTEMI (non-ST elevated myocardial infarction) (HCC)  Abnormal EKG   2 weeks of pain to his left scapula associated with cough and congestion. Pain radiates to his upper back and is constant. Nothing makes it better or worse. Seen at urgent care and told he had sinusitis. Denies any cardiac history. Denies any chest pain currently. No nausea or vomiting or fever.  EKG discussed with Dr. Herbie Baltimore on arrival. Severe ST depression globally with elevation aVR. Does not meet STEMI criteria. Dr. Herbie Baltimore recommends treatment for pneumonia as well as rate control and transfer to [facility] for the hospitalist service for cardiology  consultation and likely catheterization.  Chest x-ray shows left lower lobe pneumonia. Rectal temp is 103.7. Code sepsis activated. Patient given IV fluids, IV antibiotics after cultures obtained. Labs with leukocytosis and lactic acidosis. BP stable  CT scan is negative for PE or dissection. Does confirm pneumonia. We'll start heparin for ACS as dissection is ruled out.   Troponin positive.  Dr. Herbie Baltimore updated.  Repeat EKG similar to first with severe global ST depression and ST elevation in AVR.  D/w Dr. Conley Rolls who will arrange transfer to hospitalist service at [facility].  Dr Herbie Baltimore has alerted cardiology fellow of transfer. Airway and BP stable at time of transfer.   CRITICAL CARE Performed by: Glynn Octave Total critical care time: 60 minutes Critical care time was exclusive of separately billable procedures and treating other patients.  Critical care was necessary to treat or prevent imminent or life-threatening deterioration. Critical care was time spent personally by me on the following activities: development of treatment plan with patient and/or surrogate as well as nursing, discussions with consultants, evaluation of patient's response to treatment, examination of patient, obtaining history from patient or surrogate, ordering and performing treatments and interventions, ordering and review of laboratory studies, ordering and review of radiographic studies, pulse oximetry and re-evaluation of patient's condition.   Glynn Octave, MD 12/23/15 2329

## 2015-12-23 NOTE — H&P (Signed)
[facility] History and Physical  Thomas Thomas ZOX:[id number] DOB: Sep 30, 1956    PCP:   Jannifer Rodney, FNP   Chief Complaint: back pain, coughs, and fever.   HPI: Thomas Thomas is an 60 y.o. male with hx of HTN, anxiety, prior tobacco abuse, presented to the ER with persistent back pain, coughs, and malaise.   He was seen in the urgent care, and was given levoquin though he hadn't started as yet.  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  His EKG showed ST with deeply inverted T wave over the precordials, Troponin was elevated to 3.06, Lactic of 2.4, and serology showed K of 3.3, with Na 133, and Cr of 1.3.  He was started on IV Rocephin and Zithromax, along with IV Heparin.  Cardiology consultation with Dr Herbie Baltimore, who doesn't think that it is a STEMI, and will see patient in consultation.   Hospitalist was asked to admit him and transfer to [facility].  When I saw him, he was alert, orient, in no distress and having no CP.  His BP was 127, and HR of 130.   Rewiew of Systems:  Constitutional: Negative for malaise, fever and chills. No significant weight loss or weight gain Eyes: Negative for eye pain, redness and discharge, diplopia, visual changes, or flashes of light. ENMT: Negative for ear pain, hoarseness, nasal congestion, sinus pressure and sore throat. No headaches; tinnitus, drooling, or problem swallowing. Cardiovascular: Negative for chest pain, palpitations, diaphoresis, dyspnea and peripheral edema. ; No orthopnea, PND Respiratory: Negative for  wheezing and stridor. No pleuritic chestpain. Gastrointestinal: Negative for nausea, vomiting, diarrhea, constipation, abdominal pain, melena, blood in stool, hematemesis, jaundice and rectal bleeding.    Genitourinary: Negative for frequency, dysuria, incontinence,flank pain and hematuria; Musculoskeletal: Negative for back pain and neck pain.  Negative for swelling and trauma.;  Skin: . Negative for pruritus, rash, abrasions, bruising and skin lesion.; ulcerations Neuro: Negative for headache, lightheadedness and neck stiffness. Negative for weakness, altered level of consciousness , altered mental status, extremity weakness, burning feet, involuntary movement, seizure and syncope.  Psych: negative for anxiety, depression, insomnia, tearfulness, panic attacks, hallucinations, paranoia, suicidal or homicidal ideation   Past Medical History  Diagnosis Date  . Hypertension   . Anxiety     Past Surgical History  Procedure Laterality Date  . Knee surgery    . Hernia repair      Medications:  HOME MEDS: Prior to Admission medications   Medication Sig Start Date End Date Taking? Authorizing Provider  hydrochlorothiazide (HYDRODIURIL) 25 MG tablet Take 1 tablet (25 mg total) by mouth daily. 12/16/15  Yes Junie Spencer, FNP  naproxen (NAPROSYN) 375 MG tablet Take 375 mg by mouth 2 (two) times daily with a meal. Reported on 12/16/2015 12/08/15  Yes Historical Provider, MD     Allergies:  No Known Allergies  Social History:   reports that he quit smoking about 3 years ago. He does not have any smokeless tobacco history on file. He reports that he does not drink alcohol or use illicit drugs.  Family History: Family History  Problem Relation Age of Onset  . Heart disease Mother   . Cancer Father     stomach     Physical Exam: Filed Vitals:   12/23/15 2030 12/23/15 2030 12/23/15 2045 12/23/15 2100  BP: 139/87  123/67 132/79  Pulse: 127   127  Temp:  103.7 F (39.8 C)    TempSrc:  Rectal    Resp: 24   17  Height:      Weight:      SpO2: 100%   98%   Blood pressure 132/79, pulse 127, temperature 103.7 F (39.8 C), temperature source Rectal, resp. rate 17, height 5\' 6"  (1.676 m), weight 91.627 kg (202 lb), SpO2 98 %.  GEN:  Pleasant patient lying in the stretcher in no acute distress; cooperative with exam. PSYCH:   alert and oriented x4; does not appear anxious or depressed; affect is appropriate. HEENT: Mucous membranes pink and anicteric; PERRLA; EOM intact; no cervical lymphadenopathy nor thyromegaly or carotid bruit; no JVD; There were no stridor. Neck is very supple. Breasts:: Not examined CHEST WALL: No tenderness CHEST: Normal respiration, clear to auscultation bilaterally.  HEART: Regular rate and rhythm.  There are no murmur, rub, or gallops.   BACK: No kyphosis or scoliosis; no CVA tenderness ABDOMEN: soft and non-tender; no masses, no organomegaly, normal abdominal bowel sounds; no pannus; no intertriginous candida. There is no rebound and no distention. Rectal Exam: Not done EXTREMITIES: No bone or joint deformity; age-appropriate arthropathy of the hands and knees; no edema; no ulcerations.  There is no calf tenderness. Genitalia: not examined PULSES: 2+ and symmetric SKIN: Normal hydration no rash or ulceration CNS: Cranial nerves 2-12 grossly intact no focal lateralizing neurologic deficit.  Speech is fluent; uvula elevated with phonation, facial symmetry and tongue midline. DTR are normal bilaterally, cerebella exam is intact, barbinski is negative and strengths are equaled bilaterally.  No sensory loss.   Labs on Admission:  Basic Metabolic Panel:  Recent Labs Lab 12/23/15 1932 12/23/15 1936  NA 133* 132*  K 3.3* 3.7  CL 92* 90*  CO2 27  --   GLUCOSE 144* 138*  BUN 16 19  CREATININE 1.31* 1.10  CALCIUM 8.8*  --    Liver Function Tests: CBC:  Recent Labs Lab 12/23/15 1932 12/23/15 1936  WBC 27.3*  --   HGB 15.3 17.7*  HCT 46.2 52.0  MCV 87.2  --   PLT 191  --    Cardiac Enzymes:  Recent Labs Lab 12/23/15 1932  TROPONINI 3.06*    CBG: No results for input(s): GLUCAP in the last 168 hours.   Radiological Exams on Admission: Dg Chest Portable 1 View  12/23/2015  CLINICAL DATA:  Pt c/o productive cough and Lt shoulder pain x 2 wks. Hx former smoker, HTN,  anxiety EXAM: PORTABLE CHEST 1 VIEW COMPARISON:  06/12/2013 FINDINGS: There is focal opacity in the retrocardiac region of the left lower lobe, new since the prior exam. Remainder of the lungs is clear. No convincing pleural effusion. No pneumothorax. Normal heart, mediastinum and hila. Skeletal structures are unremarkable. IMPRESSION: Left lower lobe pneumonia. Electronically Signed   By: Amie Portland M.D.   On: 12/23/2015 19:31   Ct Angio Chest Aorta W/cm &/or Wo/cm  12/23/2015  CLINICAL DATA:  Dissection protocol used. Left shoulder pain-radiating to left chest wall, lightheaded, lower back pain x 4 days, chest congestion x 1 week. Denies sob. History of HTN, hernia repair. EXAM: CT ANGIOGRAPHY CHEST, ABDOMEN AND PELVIS TECHNIQUE: Multidetector CT imaging through the chest, abdomen and pelvis was performed using the standard protocol during bolus administration of intravenous contrast. Multiplanar reconstructed images and MIPs were obtained and reviewed to evaluate the vascular anatomy. CONTRAST:  OMNIPAQUE IOHEXOL 350 MG/ML SOLN COMPARISON:  Current chest radiograph FINDINGS: CTA CHEST FINDINGS Stop thoracic  aorta is normal in caliber. There is no dissection. Minor partly calcified plaque is noted along the aortic arch and descending thoracic aorta and at the origin of the left subclavian artery. No significant stenosis. Review of the MIP images confirms the above findings. CTA ABDOMEN AND PELVIS FINDINGS Abdominal aorta shows a small fusiform dilation in its infrarenal portion, mass in diameter of 2.4 cm iliac arteries are normal in caliber. There is mild atherosclerotic calcifications along the infrarenal abdominal aorta and iliac vessels. Celiac axis, superior mesenteric artery and single renal arteries bilaterally are widely patent. The inferior mesenteric artery is patent. CT CHEST NON ANGIOGRAPHIC FINDINGS Neck base and axilla:  No mass or adenopathy.  Normal thyroid. Mediastinum and hila: Heart  is unremarkable. No mediastinal or hilar masses or significant enlarged lymph nodes. There are several prominent lymph nodes, largest a subcarinal node measuring 11 mm in short axis. Lungs and pleura: Left lower lobe consolidation consistent with pneumonia as noted on the current chest radiograph. Subtle opacity in the posterior medial right lower lobe is likely atelectasis although could reflect additional infection. Remainder of the lungs is clear. No pleural effusion or pneumothorax. CT ABDOMEN AND PELVIS NON ANGIOGRAPHIC FINDINGS Liver, spleen, gallbladder, pancreas, adrenal glands:  Normal. Kidneys, ureters, bladder: 17 mm exophytic low-density upper pole right renal mass consistent with a cyst. 6 mm hyper attenuating mass protruding from the posterior margin of the right kidney midpole, nonspecific. No other renal masses or lesions. No hydronephrosis. Normal ureters. Bladder is unremarkable. Lymph nodes:  No adenopathy. Ascites:  None. Gastrointestinal:  Unremarkable.  No appendix visualized. MUSCULOSKELETAL FINDINGS Mild degenerative endplate spurring along the lower thoracic spine. No osteoblastic or osteolytic lesions. Review of the MIP images confirms the above findings. IMPRESSION: 1. No evidence of an aortic dissection. 2. Small fusiform dilation of the infrarenal abdominal aorta measuring 2.4 cm. Consider follow-up with ultrasound in 1 year to reassess size. 3. Left lower lobe consolidation consistent with pneumonia as noted on the current chest radiograph. 4. No other acute findings within the chest, abdomen or pelvis. Electronically Signed   By: Amie Portland M.D.   On: 12/23/2015 20:34   Ct Cta Abd/pel W/cm &/or W/o Cm  12/23/2015  CLINICAL DATA:  Dissection protocol used. Left shoulder pain-radiating to left chest wall, lightheaded, lower back pain x 4 days, chest congestion x 1 week. Denies sob. History of HTN, hernia repair. EXAM: CT ANGIOGRAPHY CHEST, ABDOMEN AND PELVIS TECHNIQUE:  Multidetector CT imaging through the chest, abdomen and pelvis was performed using the standard protocol during bolus administration of intravenous contrast. Multiplanar reconstructed images and MIPs were obtained and reviewed to evaluate the vascular anatomy. CONTRAST:  OMNIPAQUE IOHEXOL 350 MG/ML SOLN COMPARISON:  Current chest radiograph FINDINGS: CTA CHEST FINDINGS Stop thoracic aorta is normal in caliber. There is no dissection. Minor partly calcified plaque is noted along the aortic arch and descending thoracic aorta and at the origin of the left subclavian artery. No significant stenosis. Review of the MIP images confirms the above findings. CTA ABDOMEN AND PELVIS FINDINGS Abdominal aorta shows a small fusiform dilation in its infrarenal portion, mass in diameter of 2.4 cm iliac arteries are normal in caliber. There is mild atherosclerotic calcifications along the infrarenal abdominal aorta and iliac vessels. Celiac axis, superior mesenteric artery and single renal arteries bilaterally are widely patent. The inferior mesenteric artery is patent. CT CHEST NON ANGIOGRAPHIC FINDINGS Neck base and axilla:  No mass or adenopathy.  Normal thyroid. Mediastinum and hila:  Heart is unremarkable. No mediastinal or hilar masses or significant enlarged lymph nodes. There are several prominent lymph nodes, largest a subcarinal node measuring 11 mm in short axis. Lungs and pleura: Left lower lobe consolidation consistent with pneumonia as noted on the current chest radiograph. Subtle opacity in the posterior medial right lower lobe is likely atelectasis although could reflect additional infection. Remainder of the lungs is clear. No pleural effusion or pneumothorax. CT ABDOMEN AND PELVIS NON ANGIOGRAPHIC FINDINGS Liver, spleen, gallbladder, pancreas, adrenal glands:  Normal. Kidneys, ureters, bladder: 17 mm exophytic low-density upper pole right renal mass consistent with a cyst. 6 mm hyper attenuating mass protruding  from the posterior margin of the right kidney midpole, nonspecific. No other renal masses or lesions. No hydronephrosis. Normal ureters. Bladder is unremarkable. Lymph nodes:  No adenopathy. Ascites:  None. Gastrointestinal:  Unremarkable.  No appendix visualized. MUSCULOSKELETAL FINDINGS Mild degenerative endplate spurring along the lower thoracic spine. No osteoblastic or osteolytic lesions. Review of the MIP images confirms the above findings. IMPRESSION: 1. No evidence of an aortic dissection. 2. Small fusiform dilation of the infrarenal abdominal aorta measuring 2.4 cm. Consider follow-up with ultrasound in 1 year to reassess size. 3. Left lower lobe consolidation consistent with pneumonia as noted on the current chest radiograph. 4. No other acute findings within the chest, abdomen or pelvis. Electronically Signed   By: Amie Portland M.D.   On: 12/23/2015 20:34    EKG: Independently reviewed.    Assessment/Plan Present on Admission:  . CAP (community acquired pneumonia) . Sepsis (HCC) . ACS (acute coronary syndrome) (HCC) . Aneurysm of abdominal vessel (HCC)  PLAN:  Sepsis:  BC was done, and IVF given.  He is fortunately not in septic shock.  Will continue IV Rocephin and Zithromax, given most likely cause is that of CAP.  She doesn't require stress dose steroid, and certainly doesn't need pressor.  Will admit [facility] to [facility] at [facility].  Dr Lovell Sheehan of [facility] is the accepting physician.  ACS:  I will slow down his HR a little as he tolerates.  Will give IV Lopressor and start oral Lopressor.  Start Lipitor at  per day, along with  IV Heparin, and ASA.  Will get lipid profile in the am.  Please notify cardiology upon his arrival to [facility]  He will need a cardiac cath to r/out high grade lesion.  Cardiology was consulted by EDP.   Will add PPI, and made NPO for now.   Hypokalemia:  Will change his IVF to include K.  Infrarenal AAA:  Small, will need follow up.   Other plans as per orders. Code  Status: FULL CODE.  If he cannot make decision, he designated his gf Thomas Thomas as his HCP.    Houston Siren, MD. FACP [facility] Pager [phone] 7pm to 7am.  12/23/2015, 9:14 PM

## 2015-12-23 NOTE — ED Notes (Signed)
Patient states pain is radiating around from left shoulder into left chest wall. Second EKG obtained. Shown to Dr. Lajean Saver. No new orders given at this time.

## 2015-12-23 NOTE — ED Notes (Addendum)
Patient presents to ER with multiple complaints. States "I had this cold a week ago and then I thought I had gotten over that, and for 4 days I have felt lightheaded but it stopped on the way over here." Also complaining of left shoulder pain x 2 weeks from previous injury. Patient states he was seen by PCP today and given levaquin for sinusitis and cough medicine but has not started taking them yet.

## 2015-12-24 ENCOUNTER — Inpatient Hospital Stay (HOSPITAL_COMMUNITY): Payer: BLUE CROSS/BLUE SHIELD

## 2015-12-24 DIAGNOSIS — R079 Chest pain, unspecified: Secondary | ICD-10-CM

## 2015-12-24 HISTORY — PX: US ECHOCARDIOGRAPHY: HXRAD669

## 2015-12-24 LAB — COMPREHENSIVE METABOLIC PANEL
ALBUMIN: 2.9 g/dL — AB (ref 3.5–5.0)
ALK PHOS: 50 U/L (ref 38–126)
ALT: 21 U/L (ref 17–63)
AST: 72 U/L — AB (ref 15–41)
Anion gap: 8 (ref 5–15)
BILIRUBIN TOTAL: 0.9 mg/dL (ref 0.3–1.2)
BUN: 13 mg/dL (ref 6–20)
CALCIUM: 7.8 mg/dL — AB (ref 8.9–10.3)
CO2: 25 mmol/L (ref 22–32)
CREATININE: 1.27 mg/dL — AB (ref 0.61–1.24)
Chloride: 105 mmol/L (ref 101–111)
GFR calc Af Amer: >60 mL/min (ref >60)
GFR calc non Af Amer: 59 mL/min — ABNORMAL LOW (ref >60)
GLUCOSE: 155 mg/dL — AB (ref 65–99)
Potassium: 4.3 mmol/L (ref 3.5–5.1)
Sodium: 138 mmol/L (ref 135–145)
TOTAL PROTEIN: 6.1 g/dL — AB (ref 6.5–8.1)

## 2015-12-24 LAB — URINALYSIS, ROUTINE W REFLEX MICROSCOPIC
Bilirubin Urine: NEGATIVE
Glucose, UA: NEGATIVE mg/dL
KETONES UR: NEGATIVE mg/dL
LEUKOCYTES UA: NEGATIVE
NITRITE: NEGATIVE
PH: 6 (ref 5.0–8.0)
PROTEIN: NEGATIVE mg/dL
Specific Gravity, Urine: 1.042 — ABNORMAL HIGH (ref 1.005–1.030)

## 2015-12-24 LAB — CBC
HEMATOCRIT: 39.7 % (ref 39.0–52.0)
Hemoglobin: 13.1 g/dL (ref 13.0–17.0)
MCH: 28.9 pg (ref 26.0–34.0)
MCHC: 33 g/dL (ref 30.0–36.0)
MCV: 87.4 fL (ref 78.0–100.0)
PLATELETS: 163 10*3/uL (ref 150–400)
RBC: 4.54 MIL/uL (ref 4.22–5.81)
RDW: 15.1 % (ref 11.5–15.5)
WBC: 26.6 10*3/uL — AB (ref 4.0–10.5)

## 2015-12-24 LAB — URINE MICROSCOPIC-ADD ON

## 2015-12-24 LAB — TROPONIN I
Troponin I: 7.08 ng/mL (ref <0.031)
Troponin I: 13.64 ng/mL (ref <0.031)
Troponin I: 12.41 ng/mL (ref <0.031)

## 2015-12-24 LAB — LIPID PANEL
Cholesterol: 112 mg/dL (ref 0–200)
HDL: 30 mg/dL — ABNORMAL LOW (ref >40)
LDL CALC: 77 mg/dL (ref 0–99)
Total CHOL/HDL Ratio: 3.7 RATIO
Triglycerides: 25 mg/dL (ref <150)
VLDL: 5 mg/dL (ref 0–40)

## 2015-12-24 LAB — INFLUENZA PANEL BY PCR (TYPE A & B)
H1N1FLUPCR: NOT DETECTED
INFLBPCR: NEGATIVE
Influenza A By PCR: NEGATIVE

## 2015-12-24 LAB — HEPARIN LEVEL (UNFRACTIONATED): HEPARIN UNFRACTIONATED: 0.27 [IU]/mL — AB (ref 0.30–0.70)

## 2015-12-24 LAB — MRSA PCR SCREENING: MRSA by PCR: NEGATIVE

## 2015-12-24 LAB — TSH: TSH: 1.538 u[IU]/mL (ref 0.350–4.500)

## 2015-12-24 MED ORDER — DOCUSATE SODIUM 100 MG PO CAPS
100.0000 mg | ORAL_CAPSULE | Freq: Two times a day (BID) | ORAL | Status: DC
  Filled 2015-12-24 (×2): qty 1

## 2015-12-24 MED ORDER — ACETAMINOPHEN 650 MG RE SUPP: 650.0000 mg | Freq: Four times a day (QID) | RECTAL | Status: DC | PRN

## 2015-12-24 MED ORDER — ONDANSETRON HCL 4 MG PO TABS: 4.0000 mg | ORAL_TABLET | Freq: Four times a day (QID) | ORAL | Status: DC | PRN

## 2015-12-24 MED ORDER — SODIUM CHLORIDE 0.9% FLUSH: 3.0000 mL | INTRAVENOUS | Status: DC | PRN

## 2015-12-24 MED ORDER — ASPIRIN 81 MG PO CHEW
81.0000 mg | CHEWABLE_TABLET | ORAL | Status: AC
  Administered 2015-12-25: 81 mg via ORAL
  Filled 2015-12-24: qty 1

## 2015-12-24 MED ORDER — ACETAMINOPHEN 325 MG PO TABS: 650.0000 mg | ORAL_TABLET | Freq: Four times a day (QID) | ORAL | Status: DC | PRN

## 2015-12-24 MED ORDER — SODIUM CHLORIDE 0.9% FLUSH: 3.0000 mL | Freq: Two times a day (BID) | INTRAVENOUS | Status: DC

## 2015-12-24 MED ORDER — MORPHINE SULFATE (PF) 2 MG/ML IV SOLN: 2.0000 mg | INTRAVENOUS | Status: DC | PRN

## 2015-12-24 MED ORDER — SODIUM CHLORIDE 0.9 % IV SOLN: 250.0000 mL | INTRAVENOUS | Status: DC | PRN

## 2015-12-24 MED ORDER — SODIUM CHLORIDE 0.9% FLUSH
3.0000 mL | Freq: Two times a day (BID) | INTRAVENOUS | Status: DC
  Administered 2015-12-24 – 2016-01-01 (×6): 3 mL via INTRAVENOUS

## 2015-12-24 MED ORDER — ASPIRIN EC 325 MG PO TBEC
325.0000 mg | DELAYED_RELEASE_TABLET | Freq: Every day | ORAL | Status: DC
  Administered 2015-12-24 – 2015-12-25 (×2): 325 mg via ORAL
  Filled 2015-12-24 (×2): qty 1

## 2015-12-24 MED ORDER — ONDANSETRON HCL 4 MG/2ML IJ SOLN: 4.0000 mg | Freq: Four times a day (QID) | INTRAMUSCULAR | Status: DC | PRN

## 2015-12-24 MED ORDER — KCL IN DEXTROSE-NACL 40-5-0.9 MEQ/L-%-% IV SOLN
INTRAVENOUS | Status: DC
  Administered 2015-12-24 – 2015-12-25 (×4): via INTRAVENOUS
  Filled 2015-12-24 (×7): qty 1000

## 2015-12-24 MED ORDER — SODIUM CHLORIDE 0.9 % WEIGHT BASED INFUSION
1.0000 mL/kg/h | INTRAVENOUS | Status: DC
  Administered 2015-12-24 – 2015-12-25 (×2): 1 mL/kg/h via INTRAVENOUS
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
## 2015-12-24 NOTE — Progress Notes (Signed)
ANTICOAGULATION CONSULT NOTE - Follow Up Consult  Pharmacy Consult for heparin Indication: chest pain/ACS  No Known Allergies  Patient Measurements: Height:  (167.6 cm) Weight: 203 lb 0.7 oz (92.1 kg) IBW/kg (Calculated) : 63.8 Heparin Dosing Weight:   Vital Signs: Temp: 97.5 F (36.4 C) (02/28 1210) Temp Source: Axillary (02/28 1210) BP: 123/68 mmHg (02/28 1200) Pulse Rate: 82 (02/28 1200)  Labs:  Recent Labs  12/23/15 1932 12/23/15 1936 12/23/15 2026 12/24/15 0020 12/24/15 0839 12/24/15 1137  HGB 15.3 17.7*  --   --  13.1  --   HCT 46.2 52.0  --   --  39.7  --   PLT 191  --   --   --  163  --   LABPROT  --   --  15.0  --   --   --   INR  --   --  1.16  --   --   --   HEPARINUNFRC  --   --   --   --   --  0.27*  CREATININE 1.31* 1.10  --   --  1.27*  --   TROPONINI 3.06*  --   --  7.08* 13.64* 12.41*    Estimated Creatinine Clearance: 64.9 mL/min (by C-G formula based on Cr of 1.27).  Assessment: 60 year old male with pneumonia, lactic acidosis, non-ST elevation myocardial infarction, EKG with diffuse ST segment depression, elevation aVR concerning for global ischemia, triple-vessel coronary disease.  Heparin started at [facility], heparin level low this afternoon, will adjust rate and follow up in am.  Goal of Therapy:  Heparin level 0.3-0.7 units/ml Monitor platelets by anticoagulation protocol: Yes   Plan:  Increase heparin to 1450 units/hr Daily HL Cath planned for 3/1  Sheppard Coil PharmD., BCPS Clinical Pharmacist Pager [phone] 12/24/2015 1:29 PM

## 2015-12-24 NOTE — Progress Notes (Signed)
Notified cardiologist of patient's most recent troponin 7.08

## 2015-12-24 NOTE — Progress Notes (Signed)
CRITICAL VALUE ALERT  Critical value received:  Troponin 7.08  Date of notification:  12/24/2015   Time of notification:  2:16 AM   Critical value read back: yes  Nurse who received alert:  Birdena Crandall RN  MD notified (1st page):  Tama Gander  Time of first page:  2:16 AM   MD notified (2nd page):  Time of second page:  Responding MD:    Time MD responded:  [id number]

## 2015-12-24 NOTE — Progress Notes (Signed)
[facility] PROGRESS NOTE  Thomas Thomas ZOX:[id number] DOB: Oct 03, 1956 DOA: 12/23/2015 PCP: Jannifer Rodney, FNP  Assessment/Plan: 1. Non-STEMI- patient currently has no chest pain, continue heparin. Cardiac catheterization planned for a.m. cardiology following. Continue aspirin, metoprolol, Lipitor. 2. Left lower lobe pneumonia- improving, WBC still elevated to 26,000. Continue Rocephin and Zithromax. 3. 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   Code Status: Full code Family Communication: No family present at bedside Disposition Plan: And in workup for non-STEMI   Consultants: Cardiology  Procedures:  None  Antibiotics:  Ceftriaxone  Zithromax  HPI/Subjective: 60 y.o. male with hx of HTN, anxiety, prior tobacco abuse, presented to the ER with persistent back pain, coughs, and malaise. He was seen in the urgent care, and was given levoquin though he hadn't started as yet. 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 His EKG showed ST with deeply inverted T wave over the precordials, Troponin was elevated to 3.06, Lactic of 2.4, and serology showed K of 3.3, with Na 133, and Cr of 1.3. He was started on IV Rocephin and Zithromax, along with IV Heparin.  This morning patient feels better, denies chest pain or shortness of breath. Troponin is now elevated to 13.64  Objective: Filed Vitals:   12/24/15 0900 12/24/15 1000  BP: 124/74 119/76  Pulse: 84 83  Temp:    Resp:      Intake/Output Summary (Last 24 hours) at 12/24/15 1149 Last data filed at 12/24/15 1000  Gross per 24 hour  Intake   1810 ml  Output   1650 ml  Net    160 ml   Filed Weights   12/23/15 1905 12/24/15 0430  Weight: 91.627 kg (202 lb) 92.1 kg (203 lb 0.7 oz)    Exam:   General:  Appears in no acute distress  Cardiovascular: S1-S2 normal , regular rhythm  Respiratory: Clear  to auscultation bilaterally, no wheezing or crackles  Abdomen: Soft, nontender, no organomegaly  Musculoskeletal: No cyanosis/clubbing/edema of the extremities   Data Reviewed: Basic Metabolic Panel:  Recent Labs Lab 12/23/15 1932 12/23/15 1936 12/24/15 0839  NA 133* 132* 138  K 3.3* 3.7 4.3  CL 92* 90* 105  CO2 27  --  25  GLUCOSE 144* 138* 155*  BUN CREATININE 1.31* 1.10 1.27*  CALCIUM 8.8*  --  7.8*   Liver Function Tests:  Recent Labs Lab 12/24/15 0839  AST 72*  ALT 21  ALKPHOS 50  BILITOT 0.9  PROT 6.1*  ALBUMIN 2.9*   CBC:  Recent Labs Lab 12/23/15 1932 12/23/15 1936 12/24/15 0839  WBC 27.3*  --  26.6*  HGB 15.3 17.7* 13.1  HCT 46.2 52.0 39.7  MCV 87.2  --  87.4  PLT 191  --  163   Cardiac Enzymes:  Recent Labs Lab 12/23/15 1932 12/24/15 0020 12/24/15 0839  TROPONINI 3.06* 7.08* 13.64*   BNP (last 3 results)  Recent Labs  12/23/15 1932  BNP 77.0      Recent Results (from the past 240 hour(s))  Blood culture (routine x 2)     Status: None (Preliminary result)   Collection Time: 12/23/15  8:20 PM  Result Value Ref Range Status   Specimen Description BLOOD RIGHT HAND  Final   Special Requests BOTTLES DRAWN AEROBIC ONLY 6CC  Final   Culture NO GROWTH < 24 HOURS  Final  Report Status PENDING  Incomplete  Blood culture (routine x 2)     Status: None (Preliminary result)   Collection Time: 12/23/15  8:26 PM  Result Value Ref Range Status   Specimen Description BLOOD LEFT ARM  Final   Special Requests BOTTLES DRAWN AEROBIC AND ANAEROBIC 6CC  Final   Culture NO GROWTH < 24 HOURS  Final   Report Status PENDING  Incomplete  MRSA PCR Screening     Status: None   Collection Time: 12/23/15 11:30 PM  Result Value Ref Range Status   MRSA by PCR NEGATIVE NEGATIVE Final    Comment:        The GeneXpert MRSA Assay (FDA approved for NASAL specimens only), is one component of a comprehensive MRSA colonization surveillance program.  It is not intended to diagnose MRSA infection nor to guide or monitor treatment for MRSA infections.      Studies: Dg Chest Portable 1 View  12/23/2015  CLINICAL DATA:  Pt c/o productive cough and Lt shoulder pain x 2 wks. Hx former smoker, HTN, anxiety EXAM: PORTABLE CHEST 1 VIEW COMPARISON:  06/12/2013 FINDINGS: There is focal opacity in the retrocardiac region of the left lower lobe, new since the prior exam. Remainder of the lungs is clear. No convincing pleural effusion. No pneumothorax. Normal heart, mediastinum and hila. Skeletal structures are unremarkable. IMPRESSION: Left lower lobe pneumonia. Electronically Signed   By: Amie Portland M.D.   On: 12/23/2015 19:31   Ct Angio Chest Aorta W/cm &/or Wo/cm  12/23/2015  CLINICAL DATA:  Dissection protocol used. Left shoulder pain-radiating to left chest wall, lightheaded, lower back pain x 4 days, chest congestion x 1 week. Denies sob. History of HTN, hernia repair. EXAM: CT ANGIOGRAPHY CHEST, ABDOMEN AND PELVIS TECHNIQUE: Multidetector CT imaging through the chest, abdomen and pelvis was performed using the standard protocol during bolus administration of intravenous contrast. Multiplanar reconstructed images and MIPs were obtained and reviewed to evaluate the vascular anatomy. CONTRAST:  OMNIPAQUE IOHEXOL 350 MG/ML SOLN COMPARISON:  Current chest radiograph FINDINGS: CTA CHEST FINDINGS Stop thoracic aorta is normal in caliber. There is no dissection. Minor partly calcified plaque is noted along the aortic arch and descending thoracic aorta and at the origin of the left subclavian artery. No significant stenosis. Review of the MIP images confirms the above findings. CTA ABDOMEN AND PELVIS FINDINGS Abdominal aorta shows a small fusiform dilation in its infrarenal portion, mass in diameter of 2.4 cm iliac arteries are normal in caliber. There is mild atherosclerotic calcifications along the infrarenal abdominal aorta and iliac vessels. Celiac  axis, superior mesenteric artery and single renal arteries bilaterally are widely patent. The inferior mesenteric artery is patent. CT CHEST NON ANGIOGRAPHIC FINDINGS Neck base and axilla:  No mass or adenopathy.  Normal thyroid. Mediastinum and hila: Heart is unremarkable. No mediastinal or hilar masses or significant enlarged lymph nodes. There are several prominent lymph nodes, largest a subcarinal node measuring 11 mm in short axis. Lungs and pleura: Left lower lobe consolidation consistent with pneumonia as noted on the current chest radiograph. Subtle opacity in the posterior medial right lower lobe is likely atelectasis although could reflect additional infection. Remainder of the lungs is clear. No pleural effusion or pneumothorax. CT ABDOMEN AND PELVIS NON ANGIOGRAPHIC FINDINGS Liver, spleen, gallbladder, pancreas, adrenal glands:  Normal. Kidneys, ureters, bladder: 17 mm exophytic low-density upper pole right renal mass consistent with a cyst. 6 mm hyper attenuating mass protruding from the posterior margin of the right  kidney midpole, nonspecific. No other renal masses or lesions. No hydronephrosis. Normal ureters. Bladder is unremarkable. Lymph nodes:  No adenopathy. Ascites:  None. Gastrointestinal:  Unremarkable.  No appendix visualized. MUSCULOSKELETAL FINDINGS Mild degenerative endplate spurring along the lower thoracic spine. No osteoblastic or osteolytic lesions. Review of the MIP images confirms the above findings. IMPRESSION: 1. No evidence of an aortic dissection. 2. Small fusiform dilation of the infrarenal abdominal aorta measuring 2.4 cm. Consider follow-up with ultrasound in 1 year to reassess size. 3. Left lower lobe consolidation consistent with pneumonia as noted on the current chest radiograph. 4. No other acute findings within the chest, abdomen or pelvis. Electronically Signed   By: Amie Portland M.D.   On: 12/23/2015 20:34   Ct Cta Abd/pel W/cm &/or W/o Cm  12/23/2015  CLINICAL  DATA:  Dissection protocol used. Left shoulder pain-radiating to left chest wall, lightheaded, lower back pain x 4 days, chest congestion x 1 week. Denies sob. History of HTN, hernia repair. EXAM: CT ANGIOGRAPHY CHEST, ABDOMEN AND PELVIS TECHNIQUE: Multidetector CT imaging through the chest, abdomen and pelvis was performed using the standard protocol during bolus administration of intravenous contrast. Multiplanar reconstructed images and MIPs were obtained and reviewed to evaluate the vascular anatomy. CONTRAST:  OMNIPAQUE IOHEXOL 350 MG/ML SOLN COMPARISON:  Current chest radiograph FINDINGS: CTA CHEST FINDINGS Stop thoracic aorta is normal in caliber. There is no dissection. Minor partly calcified plaque is noted along the aortic arch and descending thoracic aorta and at the origin of the left subclavian artery. No significant stenosis. Review of the MIP images confirms the above findings. CTA ABDOMEN AND PELVIS FINDINGS Abdominal aorta shows a small fusiform dilation in its infrarenal portion, mass in diameter of 2.4 cm iliac arteries are normal in caliber. There is mild atherosclerotic calcifications along the infrarenal abdominal aorta and iliac vessels. Celiac axis, superior mesenteric artery and single renal arteries bilaterally are widely patent. The inferior mesenteric artery is patent. CT CHEST NON ANGIOGRAPHIC FINDINGS Neck base and axilla:  No mass or adenopathy.  Normal thyroid. Mediastinum and hila: Heart is unremarkable. No mediastinal or hilar masses or significant enlarged lymph nodes. There are several prominent lymph nodes, largest a subcarinal node measuring 11 mm in short axis. Lungs and pleura: Left lower lobe consolidation consistent with pneumonia as noted on the current chest radiograph. Subtle opacity in the posterior medial right lower lobe is likely atelectasis although could reflect additional infection. Remainder of the lungs is clear. No pleural effusion or pneumothorax. CT  ABDOMEN AND PELVIS NON ANGIOGRAPHIC FINDINGS Liver, spleen, gallbladder, pancreas, adrenal glands:  Normal. Kidneys, ureters, bladder: 17 mm exophytic low-density upper pole right renal mass consistent with a cyst. 6 mm hyper attenuating mass protruding from the posterior margin of the right kidney midpole, nonspecific. No other renal masses or lesions. No hydronephrosis. Normal ureters. Bladder is unremarkable. Lymph nodes:  No adenopathy. Ascites:  None. Gastrointestinal:  Unremarkable.  No appendix visualized. MUSCULOSKELETAL FINDINGS Mild degenerative endplate spurring along the lower thoracic spine. No osteoblastic or osteolytic lesions. Review of the MIP images confirms the above findings. IMPRESSION: 1. No evidence of an aortic dissection. 2. Small fusiform dilation of the infrarenal abdominal aorta measuring 2.4 cm. Consider follow-up with ultrasound in 1 year to reassess size. 3. Left lower lobe consolidation consistent with pneumonia as noted on the current chest radiograph. 4. No other acute findings within the chest, abdomen or pelvis. Electronically Signed   By: Amie Portland M.D.   On:  12/23/2015 20:34    Scheduled Meds: . aspirin EC  325 mg Oral Daily  . atorvastatin  80 mg Oral q1800  . azithromycin  500 mg Intravenous Q24H  . cefTRIAXone (ROCEPHIN)  IV  1 g Intravenous Q24H  . docusate sodium  100 mg Oral BID  . metoprolol tartrate  25 mg Oral BID  . pantoprazole  40 mg Oral Q0600  . sodium chloride flush  3 mL Intravenous Q12H   Continuous Infusions: . dextrose 5 % and 0.9 % NaCl with KCl 40 mEq/L 125 mL/hr at 12/24/15 0112  . heparin 1,300 Units/hr (12/23/15 2121)    Principal Problem:   Sepsis (HCC) Active Problems:   CAP (community acquired pneumonia)   ACS (acute coronary syndrome) (HCC)   Aneurysm of abdominal vessel (HCC)    Time spent: 25 min    [facility]  [facility] Pager [phone]. If 7PM-7AM, please contact night-coverage at [email or web address],  password [facility] 12/24/2015, 11:49 AM  LOS: 1 day

## 2015-12-24 NOTE — Care Management Note (Signed)
Case Management Note  Patient Details  Name: Thomas Thomas MRN: [id number] Date of Birth: 03/01/1956  Subjective/Objective:        Adm w sepsis            Action/Plan: lives w wife, pcp dr Lendon Colonel   Expected Discharge Date:                  Expected Discharge Plan:     In-House Referral:     Discharge planning Services     Post Acute Care Choice:    Choice offered to:     DME Arranged:    DME Agency:     HH Arranged:    HH Agency:     Status of Service:     Medicare Important Message Given:    Date Medicare IM Given:    Medicare IM give by:    Date Additional Medicare IM Given:    Additional Medicare Important Message give by:     If discussed at Long Length of Stay Meetings, dates discussed:    Additional Comments: ur review done  Hanley Hays, RN 12/24/2015, 9:20 AM

## 2015-12-25 ENCOUNTER — Inpatient Hospital Stay (HOSPITAL_COMMUNITY): Payer: BLUE CROSS/BLUE SHIELD | Admitting: Anesthesiology

## 2015-12-25 ENCOUNTER — Inpatient Hospital Stay (HOSPITAL_COMMUNITY): Payer: BLUE CROSS/BLUE SHIELD

## 2015-12-25 ENCOUNTER — Encounter (HOSPITAL_COMMUNITY)
Admission: EM | Disposition: A | Discharge status: Home Health | Payer: Self-pay | Source: Home / Self Care | Attending: Cardiothoracic Surgery

## 2015-12-25 DIAGNOSIS — J811 Chronic pulmonary edema: Secondary | ICD-10-CM | POA: Insufficient documentation

## 2015-12-25 DIAGNOSIS — I2510 Atherosclerotic heart disease of native coronary artery without angina pectoris: Secondary | ICD-10-CM

## 2015-12-25 DIAGNOSIS — I714 Abdominal aortic aneurysm, without rupture: Secondary | ICD-10-CM

## 2015-12-25 DIAGNOSIS — I214 Non-ST elevation (NSTEMI) myocardial infarction: Secondary | ICD-10-CM | POA: Insufficient documentation

## 2015-12-25 DIAGNOSIS — J189 Pneumonia, unspecified organism: Secondary | ICD-10-CM

## 2015-12-25 DIAGNOSIS — I249 Acute ischemic heart disease, unspecified: Secondary | ICD-10-CM

## 2015-12-25 HISTORY — PX: CARDIAC CATHETERIZATION: SHX172

## 2015-12-25 LAB — POCT I-STAT 3, ART BLOOD GAS (G3+)
ACID-BASE DEFICIT: 9 mmol/L — AB (ref 0.0–2.0)
ACID-BASE DEFICIT: 13 mmol/L — AB (ref 0.0–2.0)
ACID-BASE DEFICIT: 7 mmol/L — AB (ref 0.0–2.0)
Acid-base deficit: 5 mmol/L — ABNORMAL HIGH (ref 0.0–2.0)
BICARBONATE: 20.7 meq/L (ref 20.0–24.0)
BICARBONATE: 20.1 meq/L (ref 20.0–24.0)
BICARBONATE: 20.2 meq/L (ref 20.0–24.0)
Bicarbonate: 20.2 mEq/L (ref 20.0–24.0)
O2 SAT: 95 %
O2 Saturation: 63 %
O2 Saturation: 99 %
O2 Saturation: 97 %
PCO2 ART: 38.7 mmHg (ref 35.0–45.0)
PH ART: 7.324 — AB (ref 7.350–7.450)
PO2 ART: 120 mmHg — AB (ref 80.0–100.0)
PO2 ART: 180 mmHg — AB (ref 80.0–100.0)
Patient temperature: 97.7
TCO2: 22 mmol/L (ref 0–100)
TCO2: 23 mmol/L (ref 0–100)
TCO2: 22 mmol/L (ref 0–100)
TCO2: 21 mmol/L (ref 0–100)
pCO2 arterial: 59.7 mmHg (ref 35.0–45.0)
pCO2 arterial: 83.1 mmHg (ref 35.0–45.0)
pCO2 arterial: 44.3 mmHg (ref 35.0–45.0)
pH, Arterial: 7.148 — CL (ref 7.350–7.450)
pH, Arterial: 6.991 — CL (ref 7.350–7.450)
pH, Arterial: 7.264 — ABNORMAL LOW (ref 7.350–7.450)
pO2, Arterial: 43 mmHg — ABNORMAL LOW (ref 80.0–100.0)
pO2, Arterial: 97 mmHg (ref 80.0–100.0)

## 2015-12-25 LAB — URINE CULTURE: CULTURE: NO GROWTH

## 2015-12-25 LAB — CBC
HEMATOCRIT: 36.3 % — AB (ref 39.0–52.0)
HEMOGLOBIN: 11.3 g/dL — AB (ref 13.0–17.0)
MCH: 27.4 pg (ref 26.0–34.0)
MCHC: 31.1 g/dL (ref 30.0–36.0)
MCV: 87.9 fL (ref 78.0–100.0)
Platelets: 159 10*3/uL (ref 150–400)
RBC: 4.13 MIL/uL — AB (ref 4.22–5.81)
RDW: 15.1 % (ref 11.5–15.5)
WBC: 20 10*3/uL — ABNORMAL HIGH (ref 4.0–10.5)

## 2015-12-25 LAB — PROCALCITONIN: PROCALCITONIN: 0.48 ng/mL

## 2015-12-25 LAB — CARBOXYHEMOGLOBIN
Carboxyhemoglobin: 0.5 % (ref 0.5–1.5)
Methemoglobin: 0.8 % (ref 0.0–1.5)
O2 SAT: 74.3 %
TOTAL HEMOGLOBIN: 14.7 g/dL (ref 13.5–18.0)

## 2015-12-25 LAB — GLUCOSE, CAPILLARY: GLUCOSE-CAPILLARY: 143 mg/dL — AB (ref 65–99)

## 2015-12-25 LAB — HEMOGLOBIN A1C
HEMOGLOBIN A1C: 6.7 % — AB (ref 4.8–5.6)
MEAN PLASMA GLUCOSE: 146 mg/dL

## 2015-12-25 LAB — HEPARIN LEVEL (UNFRACTIONATED)
HEPARIN UNFRACTIONATED: 0.16 [IU]/mL — AB (ref 0.30–0.70)
Heparin Unfractionated: 0.36 IU/mL (ref 0.30–0.70)

## 2015-12-25 SURGERY — LEFT HEART CATH AND CORONARY ANGIOGRAPHY

## 2015-12-25 MED ORDER — SODIUM CHLORIDE 0.9 % IV SOLN: INTRAVENOUS | Status: DC | PRN

## 2015-12-25 MED ORDER — FENTANYL CITRATE (PF) 100 MCG/2ML IJ SOLN
INTRAMUSCULAR | Status: DC | PRN
  Administered 2015-12-25: 75 ug via INTRAVENOUS
  Administered 2015-12-25: 100 ug via INTRAVENOUS
  Administered 2015-12-25: 25 ug via INTRAVENOUS
  Administered 2015-12-25: 100 ug via INTRAVENOUS

## 2015-12-25 MED ORDER — HEPARIN (PORCINE) IN NACL 2-0.9 UNIT/ML-% IJ SOLN
INTRAMUSCULAR | Status: DC | PRN
  Administered 2015-12-25: 1500 mL

## 2015-12-25 MED ORDER — DOCUSATE SODIUM 50 MG/5ML PO LIQD
100.0000 mg | Freq: Two times a day (BID) | ORAL | Status: DC | PRN
  Filled 2015-12-25: qty 10

## 2015-12-25 MED ORDER — ETOMIDATE 2 MG/ML IV SOLN
20.0000 mg | Freq: Once | INTRAVENOUS | Status: AC
  Administered 2015-12-25: 20 mg via INTRAVENOUS
  Filled 2015-12-25: qty 10

## 2015-12-25 MED ORDER — PANTOPRAZOLE SODIUM 40 MG IV SOLR
40.0000 mg | INTRAVENOUS | Status: DC
  Administered 2015-12-25 – 2015-12-26 (×2): 40 mg via INTRAVENOUS
  Filled 2015-12-25 (×3): qty 40

## 2015-12-25 MED ORDER — VERAPAMIL HCL 2.5 MG/ML IV SOLN
INTRAVENOUS | Status: DC | PRN
  Administered 2015-12-25: 10 mL via INTRA_ARTERIAL

## 2015-12-25 MED ORDER — VERAPAMIL HCL 2.5 MG/ML IV SOLN
INTRAVENOUS | Status: AC
  Filled 2015-12-25: qty 2

## 2015-12-25 MED ORDER — NITROGLYCERIN 0.4 MG/SPRAY TL SOLN
Status: AC
  Filled 2015-12-25: qty 4.9

## 2015-12-25 MED ORDER — PROPOFOL 10 MG/ML IV BOLUS
INTRAVENOUS | Status: DC | PRN
  Administered 2015-12-25: 100 mg via INTRAVENOUS
  Administered 2015-12-25: 50 mg via INTRAVENOUS

## 2015-12-25 MED ORDER — SODIUM CHLORIDE 0.9% FLUSH
3.0000 mL | Freq: Two times a day (BID) | INTRAVENOUS | Status: DC
  Administered 2015-12-29 – 2015-12-31 (×3): 3 mL via INTRAVENOUS

## 2015-12-25 MED ORDER — SODIUM CHLORIDE 0.9% FLUSH
3.0000 mL | Freq: Two times a day (BID) | INTRAVENOUS | Status: DC
  Administered 2015-12-25: 10 mL via INTRAVENOUS
  Administered 2015-12-26 – 2016-01-01 (×7): 3 mL via INTRAVENOUS

## 2015-12-25 MED ORDER — MIDAZOLAM HCL 2 MG/2ML IJ SOLN
2.0000 mg | Freq: Once | INTRAMUSCULAR | Status: AC
  Administered 2015-12-25: 2 mg via INTRAVENOUS

## 2015-12-25 MED ORDER — NOREPINEPHRINE BITARTRATE 1 MG/ML IV SOLN
0.0000 ug/min | INTRAVENOUS | Status: DC
  Administered 2015-12-25: 5 ug/min via INTRAVENOUS
  Filled 2015-12-25 (×2): qty 16

## 2015-12-25 MED ORDER — FUROSEMIDE 10 MG/ML IJ SOLN
INTRAMUSCULAR | Status: AC
  Filled 2015-12-25: qty 4

## 2015-12-25 MED ORDER — SODIUM CHLORIDE 0.9 % IV SOLN
25.0000 ug/h | INTRAVENOUS | Status: DC
  Administered 2015-12-25: 50 ug/h via INTRAVENOUS
  Filled 2015-12-25 (×3): qty 50

## 2015-12-25 MED ORDER — SODIUM CHLORIDE 0.9 % IV SOLN
INTRAVENOUS | Status: DC | PRN
  Administered 2015-12-25: 10 mL/h via INTRAVENOUS

## 2015-12-25 MED ORDER — MIDAZOLAM HCL 2 MG/2ML IJ SOLN
INTRAMUSCULAR | Status: AC
  Filled 2015-12-25: qty 2

## 2015-12-25 MED ORDER — MIDAZOLAM HCL 2 MG/2ML IJ SOLN
2.0000 mg | INTRAMUSCULAR | Status: DC | PRN
  Administered 2015-12-25: 2 mg via INTRAVENOUS
  Filled 2015-12-25: qty 2

## 2015-12-25 MED ORDER — SUCCINYLCHOLINE CHLORIDE 20 MG/ML IJ SOLN
INTRAMUSCULAR | Status: DC | PRN
  Administered 2015-12-25: 100 mg via INTRAVENOUS

## 2015-12-25 MED ORDER — SODIUM CHLORIDE 0.9 % IV SOLN
250.0000 mL | INTRAVENOUS | Status: DC | PRN
  Administered 2015-12-25: 10 mL/h via INTRAVENOUS

## 2015-12-25 MED ORDER — SODIUM CHLORIDE 0.9 % IV SOLN
250.0000 mL | INTRAVENOUS | Status: DC | PRN
  Administered 2016-01-02: 13:00:00 via INTRAVENOUS

## 2015-12-25 MED ORDER — HEPARIN SODIUM (PORCINE) 1000 UNIT/ML IJ SOLN
INTRAMUSCULAR | Status: DC | PRN
  Administered 2015-12-25: 5000 [IU] via INTRAVENOUS

## 2015-12-25 MED ORDER — NITROGLYCERIN 0.4 MG/SPRAY TL SOLN
Status: DC | PRN
  Administered 2015-12-25: 1 via SUBLINGUAL

## 2015-12-25 MED ORDER — FENTANYL CITRATE (PF) 100 MCG/2ML IJ SOLN
INTRAMUSCULAR | Status: AC
  Filled 2015-12-25: qty 2

## 2015-12-25 MED ORDER — LIDOCAINE HCL (PF) 1 % IJ SOLN
INTRAMUSCULAR | Status: DC | PRN
  Administered 2015-12-25: 5 mL

## 2015-12-25 MED ORDER — SODIUM CHLORIDE 0.9% FLUSH
3.0000 mL | INTRAVENOUS | Status: DC | PRN
  Administered 2015-12-30 – 2015-12-31 (×2): 3 mL via INTRAVENOUS
  Filled 2015-12-25 (×2): qty 3

## 2015-12-25 MED ORDER — MIDAZOLAM HCL 5 MG/ML IJ SOLN
0.0000 mg/h | INTRAMUSCULAR | Status: DC
  Administered 2015-12-26: 3 mg/h via INTRAVENOUS
  Administered 2015-12-26: 5 mg/h via INTRAVENOUS
  Filled 2015-12-25 (×3): qty 10

## 2015-12-25 MED ORDER — ASPIRIN EC 81 MG PO TBEC
81.0000 mg | DELAYED_RELEASE_TABLET | Freq: Every day | ORAL | Status: DC
  Administered 2015-12-26 – 2016-01-01 (×7): 81 mg via ORAL
  Filled 2015-12-25 (×7): qty 1

## 2015-12-25 MED ORDER — NITROGLYCERIN IN D5W 200-5 MCG/ML-% IV SOLN
INTRAVENOUS | Status: AC
  Filled 2015-12-25: qty 250

## 2015-12-25 MED ORDER — FUROSEMIDE 10 MG/ML IJ SOLN
INTRAMUSCULAR | Status: DC | PRN
  Administered 2015-12-25 (×2): 40 mg via INTRAVENOUS

## 2015-12-25 MED ORDER — NITROGLYCERIN IN D5W 200-5 MCG/ML-% IV SOLN
0.0000 ug/min | INTRAVENOUS | Status: DC
  Administered 2015-12-25: 20 ug/min via INTRAVENOUS

## 2015-12-25 MED ORDER — HEPARIN (PORCINE) IN NACL 2-0.9 UNIT/ML-% IJ SOLN
INTRAMUSCULAR | Status: AC
  Filled 2015-12-25: qty 1500

## 2015-12-25 MED ORDER — HEPARIN SODIUM (PORCINE) 1000 UNIT/ML IJ SOLN
INTRAMUSCULAR | Status: AC
  Filled 2015-12-25: qty 1

## 2015-12-25 MED ORDER — MIDAZOLAM HCL 5 MG/ML IJ SOLN
1.0000 mg/h | INTRAMUSCULAR | Status: DC
  Administered 2015-12-25: 2 mg/h via INTRAVENOUS
  Filled 2015-12-25: qty 10

## 2015-12-25 MED ORDER — LIDOCAINE HCL (PF) 1 % IJ SOLN
INTRAMUSCULAR | Status: AC
  Filled 2015-12-25: qty 30

## 2015-12-25 MED ORDER — FUROSEMIDE 10 MG/ML IJ SOLN
80.0000 mg | Freq: Four times a day (QID) | INTRAMUSCULAR | Status: DC
  Administered 2015-12-25: 80 mg via INTRAVENOUS
  Filled 2015-12-25: qty 8

## 2015-12-25 MED ORDER — ROCURONIUM BROMIDE 50 MG/5ML IV SOLN
50.0000 mg | Freq: Once | INTRAVENOUS | Status: AC
  Administered 2015-12-25: 50 mg via INTRAVENOUS
  Filled 2015-12-25: qty 5

## 2015-12-25 MED ORDER — MIDAZOLAM HCL 2 MG/2ML IJ SOLN
INTRAMUSCULAR | Status: DC | PRN
  Administered 2015-12-25 (×2): 1 mg via INTRAVENOUS
  Administered 2015-12-25: 4 mg via INTRAVENOUS

## 2015-12-25 MED ORDER — FENTANYL CITRATE (PF) 100 MCG/2ML IJ SOLN
50.0000 ug | Freq: Once | INTRAMUSCULAR | Status: AC
  Administered 2016-01-02: 250 ug via INTRAVENOUS
  Administered 2016-01-02: 100 ug via INTRAVENOUS
  Administered 2016-01-02: 150 ug via INTRAVENOUS
  Administered 2016-01-02: 100 ug via INTRAVENOUS
  Administered 2016-01-02: 150 ug via INTRAVENOUS
  Administered 2016-01-02: 50 ug via INTRAVENOUS
  Administered 2016-01-02: 250 ug via INTRAVENOUS
  Administered 2016-01-02: 100 ug via INTRAVENOUS
  Administered 2016-01-02 (×2): 250 ug via INTRAVENOUS

## 2015-12-25 MED ORDER — FENTANYL BOLUS VIA INFUSION
25.0000 ug | INTRAVENOUS | Status: DC | PRN
  Filled 2015-12-25: qty 25

## 2015-12-25 MED ORDER — MIDAZOLAM HCL 2 MG/2ML IJ SOLN: 2.0000 mg | INTRAMUSCULAR | Status: DC | PRN

## 2015-12-25 MED ORDER — SODIUM CHLORIDE 0.9% FLUSH
3.0000 mL | INTRAVENOUS | Status: DC | PRN
  Administered 2015-12-29: 3 mL via INTRAVENOUS
  Filled 2015-12-25: qty 3

## 2015-12-25 SURGICAL SUPPLY — 11 items
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
## 2015-12-25 NOTE — Progress Notes (Signed)
I was initially called to cath lab for bipap.  Upon entering room, noted pt in severe resp distress, desat 60's%.  Placed pt  on bipap initially until anesthesia could come to intubate emergently.  Pt placed on initial vent settings per cath lab MD- until CCM could come to room.  Once Dr Tyson Alias came to room, all vent changes were made/discussed w/ MD.  Pt required frequent/constant urgent suctioning d/t copious frothy pink secretions.  Pt was transported to ICU on vent and vent management continued per CCM.

## 2015-12-25 NOTE — Progress Notes (Signed)
[facility] PROGRESS NOTE  Thomas Thomas ZOX:[id number] DOB: July 06, 1956 DOA: 12/23/2015 PCP: Jannifer Rodney, FNP  Assessment/Plan: 1. Non-STEMI- patient reports no chest pain, continue heparin. Cardiac catheterization planned this a.m. cardiology following. Continue aspirin, metoprolol, Lipitor. 2. Left lower lobe pneumonia- improving, WBC still elevated to 20,000 but trending down. Continue Rocephin and Zithromax. 3. 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   Code Status: Full code Family Communication: No family present at bedside Disposition Plan: P work up for non-STEMI   Consultants: Cardiology  Procedures:  None  Antibiotics:  Ceftriaxone  Zithromax  HPI/Subjective: 60 y.o. male with hx of HTN, anxiety, prior tobacco abuse, presented to the ER with persistent back pain, coughs, and malaise. He was seen in the urgent care, and was given levoquin though he hadn't started as yet. 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 His EKG showed ST with deeply inverted T wave over the precordials, Troponin was elevated to 3.06, Lactic of 2.4, and serology showed K of 3.3, with Na 133, and Cr of 1.3. He was started on IV Rocephin and Zithromax, along with IV Heparin.  Pt has no new complaints states that he is feeling better.  Objective: Filed Vitals:   12/25/15 0400 12/25/15 0733  BP: 106/70 141/91  Pulse:  77  Temp: 98.3 F (36.8 C) 98.3 F (36.8 C)  Resp:  20    Intake/Output Summary (Last 24 hours) at 12/25/15 1043 Last data filed at 12/25/15 1000  Gross per 24 hour  Intake 5891.38 ml  Output    800 ml  Net 5091.38 ml   Filed Weights   12/23/15 1905 12/24/15 0430 12/25/15 0500  Weight: 91.627 kg (202 lb) 92.1 kg (203 lb 0.7 oz) 93.35 kg (205 lb 12.8 oz)    Exam:   General:  Appears in no acute distress, alert and awake  Cardiovascular:  S1-S2 normal , regular rhythm  Respiratory: Clear to auscultation bilaterally, no wheezing or crackles  Abdomen: Soft, nontender, no organomegaly  Musculoskeletal: No cyanosis/clubbing/edema of the extremities   Data Reviewed: Basic Metabolic Panel:  Recent Labs Lab 12/23/15 1932 12/23/15 1936 12/24/15 0839  NA 133* 132* 138  K 3.3* 3.7 4.3  CL 92* 90* 105  CO2 27  --  25  GLUCOSE 144* 138* 155*  BUN 16 19 13   CREATININE 1.31* 1.10 1.27*  CALCIUM 8.8*  --  7.8*   Liver Function Tests:  Recent Labs Lab 12/24/15 0839  AST 72*  ALT 21  ALKPHOS 50  BILITOT 0.9  PROT 6.1*  ALBUMIN 2.9*   CBC:  Recent Labs Lab 12/23/15 1932 12/23/15 1936 12/24/15 0839 12/25/15 0320  WBC 27.3*  --  26.6* 20.0*  HGB 15.3 17.7* 13.1 11.3*  HCT 46.2 52.0 39.7 36.3*  MCV 87.2  --  87.4 87.9  PLT 191  --  163 159   Cardiac Enzymes:  Recent Labs Lab 12/23/15 1932 12/24/15 0020 12/24/15 0839 12/24/15 1137  TROPONINI 3.06* 7.08* 13.64* 12.41*   BNP (last 3 results)  Recent Labs  12/23/15 1932  BNP 77.0      Recent Results (from the past 240 hour(s))  Blood culture (routine x 2)     Status: None (Preliminary result)   Collection Time: 12/23/15  8:20 PM  Result Value Ref Range Status   Specimen Description BLOOD RIGHT HAND  Final   Special Requests  BOTTLES DRAWN AEROBIC ONLY 6CC  Final   Culture NO GROWTH 2 DAYS  Final   Report Status PENDING  Incomplete  Blood culture (routine x 2)     Status: None (Preliminary result)   Collection Time: 12/23/15  8:26 PM  Result Value Ref Range Status   Specimen Description BLOOD LEFT ARM  Final   Special Requests BOTTLES DRAWN AEROBIC AND ANAEROBIC 6CC  Final   Culture NO GROWTH 2 DAYS  Final   Report Status PENDING  Incomplete  MRSA PCR Screening     Status: None   Collection Time: 12/23/15 11:30 PM  Result Value Ref Range Status   MRSA by PCR NEGATIVE NEGATIVE Final    Comment:        The GeneXpert MRSA Assay  (FDA approved for NASAL specimens only), is one component of a comprehensive MRSA colonization surveillance program. It is not intended to diagnose MRSA infection nor to guide or monitor treatment for MRSA infections.      Studies: Dg Chest Portable 1 View  12/23/2015  CLINICAL DATA:  Pt c/o productive cough and Lt shoulder pain x 2 wks. Hx former smoker, HTN, anxiety EXAM: PORTABLE CHEST 1 VIEW COMPARISON:  06/12/2013 FINDINGS: There is focal opacity in the retrocardiac region of the left lower lobe, new since the prior exam. Remainder of the lungs is clear. No convincing pleural effusion. No pneumothorax. Normal heart, mediastinum and hila. Skeletal structures are unremarkable. IMPRESSION: Left lower lobe pneumonia. Electronically Signed   By: Amie Portland M.D.   On: 12/23/2015 19:31   Ct Angio Chest Aorta W/cm &/or Wo/cm  12/23/2015  CLINICAL DATA:  Dissection protocol used. Left shoulder pain-radiating to left chest wall, lightheaded, lower back pain x 4 days, chest congestion x 1 week. Denies sob. History of HTN, hernia repair. EXAM: CT ANGIOGRAPHY CHEST, ABDOMEN AND PELVIS TECHNIQUE: Multidetector CT imaging through the chest, abdomen and pelvis was performed using the standard protocol during bolus administration of intravenous contrast. Multiplanar reconstructed images and MIPs were obtained and reviewed to evaluate the vascular anatomy. CONTRAST:  OMNIPAQUE IOHEXOL 350 MG/ML SOLN COMPARISON:  Current chest radiograph FINDINGS: CTA CHEST FINDINGS Stop thoracic aorta is normal in caliber. There is no dissection. Minor partly calcified plaque is noted along the aortic arch and descending thoracic aorta and at the origin of the left subclavian artery. No significant stenosis. Review of the MIP images confirms the above findings. CTA ABDOMEN AND PELVIS FINDINGS Abdominal aorta shows a small fusiform dilation in its infrarenal portion, mass in diameter of 2.4 cm iliac arteries are normal  in caliber. There is mild atherosclerotic calcifications along the infrarenal abdominal aorta and iliac vessels. Celiac axis, superior mesenteric artery and single renal arteries bilaterally are widely patent. The inferior mesenteric artery is patent. CT CHEST NON ANGIOGRAPHIC FINDINGS Neck base and axilla:  No mass or adenopathy.  Normal thyroid. Mediastinum and hila: Heart is unremarkable. No mediastinal or hilar masses or significant enlarged lymph nodes. There are several prominent lymph nodes, largest a subcarinal node measuring 11 mm in short axis. Lungs and pleura: Left lower lobe consolidation consistent with pneumonia as noted on the current chest radiograph. Subtle opacity in the posterior medial right lower lobe is likely atelectasis although could reflect additional infection. Remainder of the lungs is clear. No pleural effusion or pneumothorax. CT ABDOMEN AND PELVIS NON ANGIOGRAPHIC FINDINGS Liver, spleen, gallbladder, pancreas, adrenal glands:  Normal. Kidneys, ureters, bladder: 17 mm exophytic low-density upper pole right renal mass  consistent with a cyst. 6 mm hyper attenuating mass protruding from the posterior margin of the right kidney midpole, nonspecific. No other renal masses or lesions. No hydronephrosis. Normal ureters. Bladder is unremarkable. Lymph nodes:  No adenopathy. Ascites:  None. Gastrointestinal:  Unremarkable.  No appendix visualized. MUSCULOSKELETAL FINDINGS Mild degenerative endplate spurring along the lower thoracic spine. No osteoblastic or osteolytic lesions. Review of the MIP images confirms the above findings. IMPRESSION: 1. No evidence of an aortic dissection. 2. Small fusiform dilation of the infrarenal abdominal aorta measuring 2.4 cm. Consider follow-up with ultrasound in 1 year to reassess size. 3. Left lower lobe consolidation consistent with pneumonia as noted on the current chest radiograph. 4. No other acute findings within the chest, abdomen or pelvis.  Electronically Signed   By: Amie Portland M.D.   On: 12/23/2015 20:34   Ct Cta Abd/pel W/cm &/or W/o Cm  12/23/2015  CLINICAL DATA:  Dissection protocol used. Left shoulder pain-radiating to left chest wall, lightheaded, lower back pain x 4 days, chest congestion x 1 week. Denies sob. History of HTN, hernia repair. EXAM: CT ANGIOGRAPHY CHEST, ABDOMEN AND PELVIS TECHNIQUE: Multidetector CT imaging through the chest, abdomen and pelvis was performed using the standard protocol during bolus administration of intravenous contrast. Multiplanar reconstructed images and MIPs were obtained and reviewed to evaluate the vascular anatomy. CONTRAST:  OMNIPAQUE IOHEXOL 350 MG/ML SOLN COMPARISON:  Current chest radiograph FINDINGS: CTA CHEST FINDINGS Stop thoracic aorta is normal in caliber. There is no dissection. Minor partly calcified plaque is noted along the aortic arch and descending thoracic aorta and at the origin of the left subclavian artery. No significant stenosis. Review of the MIP images confirms the above findings. CTA ABDOMEN AND PELVIS FINDINGS Abdominal aorta shows a small fusiform dilation in its infrarenal portion, mass in diameter of 2.4 cm iliac arteries are normal in caliber. There is mild atherosclerotic calcifications along the infrarenal abdominal aorta and iliac vessels. Celiac axis, superior mesenteric artery and single renal arteries bilaterally are widely patent. The inferior mesenteric artery is patent. CT CHEST NON ANGIOGRAPHIC FINDINGS Neck base and axilla:  No mass or adenopathy.  Normal thyroid. Mediastinum and hila: Heart is unremarkable. No mediastinal or hilar masses or significant enlarged lymph nodes. There are several prominent lymph nodes, largest a subcarinal node measuring 11 mm in short axis. Lungs and pleura: Left lower lobe consolidation consistent with pneumonia as noted on the current chest radiograph. Subtle opacity in the posterior medial right lower lobe is likely  atelectasis although could reflect additional infection. Remainder of the lungs is clear. No pleural effusion or pneumothorax. CT ABDOMEN AND PELVIS NON ANGIOGRAPHIC FINDINGS Liver, spleen, gallbladder, pancreas, adrenal glands:  Normal. Kidneys, ureters, bladder: 17 mm exophytic low-density upper pole right renal mass consistent with a cyst. 6 mm hyper attenuating mass protruding from the posterior margin of the right kidney midpole, nonspecific. No other renal masses or lesions. No hydronephrosis. Normal ureters. Bladder is unremarkable. Lymph nodes:  No adenopathy. Ascites:  None. Gastrointestinal:  Unremarkable.  No appendix visualized. MUSCULOSKELETAL FINDINGS Mild degenerative endplate spurring along the lower thoracic spine. No osteoblastic or osteolytic lesions. Review of the MIP images confirms the above findings. IMPRESSION: 1. No evidence of an aortic dissection. 2. Small fusiform dilation of the infrarenal abdominal aorta measuring 2.4 cm. Consider follow-up with ultrasound in 1 year to reassess size. 3. Left lower lobe consolidation consistent with pneumonia as noted on the current chest radiograph. 4. No other acute findings within  the chest, abdomen or pelvis. Electronically Signed   By: Amie Portland M.D.   On: 12/23/2015 20:34    Scheduled Meds: . [START ON 12/26/2015] aspirin EC  81 mg Oral Daily  . atorvastatin  80 mg Oral q1800  . azithromycin  500 mg Intravenous Q24H  . cefTRIAXone (ROCEPHIN)  IV  1 g Intravenous Q24H  . docusate sodium  100 mg Oral BID  . metoprolol tartrate  25 mg Oral BID  . pantoprazole  40 mg Oral Q0600  . sodium chloride flush  3 mL Intravenous Q12H  . sodium chloride flush  3 mL Intravenous Q12H   Continuous Infusions: . sodium chloride 1 mL/kg/hr (12/25/15 1027)  . dextrose 5 % and 0.9 % NaCl with KCl 40 mEq/L 125 mL/hr at 12/25/15 1027  . heparin 1,800 Units/hr (12/25/15 0409)    Principal Problem:   Sepsis (HCC) Active Problems:   CAP (community  acquired pneumonia)   ACS (acute coronary syndrome) (HCC)   Aneurysm of abdominal vessel (HCC)    Time spent: 30 min    Penny Pia  [facility] Pager [phone]. If 7PM-7AM, please contact night-coverage at [email or web address], password [facility] 12/25/2015, 10:43 AM  LOS: 2 days

## 2015-12-25 NOTE — Consult Note (Addendum)
PULMONARY / CRITICAL CARE MEDICINE   Name: Thomas Thomas MRN: [id number] DOB: 1955-12-20    ADMISSION DATE:  12/23/2015 CONSULTATION DATE:  12/25/2015  REFERRING MD:  TRH  CHIEF COMPLAINT:  Acute heart failure  HISTORY OF PRESENT ILLNESS:   60 year old male with PMH as below, which includes Hypertension and Anxiety. He first started with general malaise about 1 week prior to admission. He developed upper respiratory symptoms (productive cough) and presented to PCP and was treated as viral URI with nasal decongestant and expectorant. Symptoms did not improve and he presented to [facility] ED 2/27. In ED he was tachycardiac and hypertensive, with adequate sats on room air. ECG showed some depression in ST leads V5/6 and elevation in aVR. CXR showed some retrocardiac opacification. A CT angiogram was obtained and confirmed the presence of pneumonia and ruled out pulmonary embolism and aortic dissection; it also demonstrated a small fusiform dilation of the infrarenal abdominal aorta measuring 2.4 cm.He was admitted for sepsis secondary to CAP. He was transferred to [facility] for cardiology evaluation. 3/1 he went to cath lab and found critical 3 vessel obstructive CAD 80% proximal LAD, 75% diagonal, 100% LCx with left to left collaterals, 100% mid RCA with left to right collaterals. During the case he developed severe frank pulmonary edema requiring intubation. PCCM consulted.   PAST MEDICAL HISTORY :  He  has a past medical history of Hypertension and Anxiety.  PAST SURGICAL HISTORY: He  has past surgical history that includes Knee surgery and Hernia repair.  No Known Allergies  No current facility-administered medications on file prior to encounter.   Current Outpatient Prescriptions on File Prior to Encounter  Medication Sig  . hydrochlorothiazide (HYDRODIURIL) 25 MG tablet Take 1 tablet (25 mg total) by mouth daily.  . naproxen (NAPROSYN) 375 MG tablet Take 375 mg by mouth 2 (two) times daily with  a meal. Reported on 12/16/2015    FAMILY HISTORY:  His indicated that his mother is alive. He indicated that his father is deceased.   SOCIAL HISTORY: He  reports that he quit smoking about 3 years ago. He does not have any smokeless tobacco history on file. He reports that he does not drink alcohol or use illicit drugs.  REVIEW OF SYSTEMS:   Unable due to acute encephalopathy  SUBJECTIVE:   VITAL SIGNS: BP 128/83 mmHg  Pulse 71  Temp(Src) 98.7 F (37.1 C) (Oral)  Resp 18  Ht  (1.676 m)  Wt 93.35 kg (205 lb 12.8 oz)  BMI 33.23 kg/m2  SpO2 94%  HEMODYNAMICS:    VENTILATOR SETTINGS:    INTAKE / OUTPUT: I/O last 3 completed shifts: In: 6756.1 [P.O.:1300; I.V.:5156.1; IV Piggyback:300] Out: 2050 [Urine:2050]  PHYSICAL EXAMINATION: General:  Obese male in NAD Neuro:  Agitated on vent HEENT:  [place]/AT, PERRL, no appreciable JVD Cardiovascular: RRR, no MRG Lungs:  Coarse throughout Abdomen:  Obese, soft, non-distended Musculoskeletal:  No acute deformity Skin:  Grossly intact  LABS:  BMET  Recent Labs Lab 12/23/15 1932 12/23/15 1936 12/24/15 0839  NA 133* 132* 138  K 3.3* 3.7 4.3  CL 92* 90* 105  CO2 27  --  25  BUN CREATININE 1.31* 1.10 1.27*  GLUCOSE 144* 138* 155*    Electrolytes  Recent Labs Lab 12/23/15 1932 12/24/15 0839  CALCIUM 8.8* 7.8*    CBC  Recent Labs Lab 12/23/15 1932 12/23/15 1936 12/24/15 0839 12/25/15 0320  WBC 27.3*  --  26.6*  20.0*  HGB 15.3 17.7* 13.1 11.3*  HCT 46.2 52.0 39.7 36.3*  PLT 191  --  163 159    Coag's  Recent Labs Lab 12/23/15 2026  INR 1.16    Sepsis Markers  Recent Labs Lab 12/23/15 1936  LATICACIDVEN 2.46*    ABG No results for input(s): PHART, PCO2ART, PO2ART in the last 168 hours.  Liver Enzymes  Recent Labs Lab 12/24/15 0839  AST 72*  ALT 21  ALKPHOS 50  BILITOT 0.9  ALBUMIN 2.9*    Cardiac Enzymes  Recent Labs Lab 12/24/15 0020 12/24/15 0839  12/24/15 1137  TROPONINI 7.08* 13.64* 12.41*    Glucose No results for input(s): GLUCAP in the last 168 hours.  Imaging No results found.   STUDIES:  CT angio chest/abd/pelv 2/27 > No evidence of an aortic dissection. Small fusiform dilation of the infrarenal abdominal aorta measuring 2.4 cm. Consider follow-up with ultrasound in 1 year to reassess size. Left lower lobe consolidation consistent with pneumonia as noted on the current chest radiograph. No other acute findings within the chest, abdomen or pelvis.  CULTURES: BCx2 2/27 >>> Urine cx 2/27 ngtd  ANTIBIOTICS: Rocephin 2/27 > Azithromycin 2/27 >  SIGNIFICANT EVENTS: 2/27 admit 3/1 LHC, pulm edema, intubated  LINES/TUBES: ETT 3/1 >  DISCUSSION: 60 year old male admitted for sepsis and ?ACS, underwent cath 3/1 and found severe 3 vessel disease. During cath developed respiratory distress and frank pulmonary edema. Intubated in cath lab, PCCM to assume care.  ASSESSMENT / PLAN:  PULMONARY A: Acute hypoxemic respiratory failure secondary to pulmonary edema CAP  P:   Full vent support Wean PEEP/FiO2 as tolerated Art line for frequent ABGs CXR for ETT position  CARDIOVASCULAR A:  3 Vessel CAD Acute heart failure H/o Hypertension  P:  Cardiology primary MAP goal > Levophed for MAP goal Assess lactic place CVL Assess co-ox CVP Diurese Lasix 80mg  q6 hours Holding outpatient HCTZ  RENAL A:   AKI  P:   IVF to [facility] Correct electrolytes as indicated  GASTROINTESTINAL A:   No acute issues   P:   Protonix for SUP NPO for now  HEMATOLOGIC A:   Anemia  P:  Follow CBC Transfuse per ICU guidelines  INFECTIOUS A:   CAP  P:   Continue ABX as above PCT  ENDOCRINE A:   No acute issues   P:   Follow glucose on chemistry  NEUROLOGIC A:   Acute encephalopathy secondary to hypoxemia/medical sedation  P:   RASS goal: -1 Fentanyl infusion PRN versed WUA daily   FAMILY  -  Updates:   - Inter-disciplinary family meet or Palliative Care meeting due by:  3/8   [name], AGACNP-BC [facility] Pager [phone] or [phone]  12/25/2015 5:56 PM   STAFF NOTE: Cindi Carbon, MD FACP have personally reviewed patient's available data, including medical history, events of note, physical examination and test results as part of my evaluation. I have discussed with resident/NP and other care providers such as pharmacist, RN and RRT. In addition, I personally evaluated patient and elicited key findings of: Arrived in cath lab , pt in distress ett had been placed, frothy secretions in ETT excessive, ronchi crackles bilateral, min edema, heart sounds distant secondary to BS, clinical picture is flash edema from ischemia, cath labs results reviewed with Dr Thomasene Lot, no further intervention at this time, lasix to neg 2 liters balance, keep NTG if BP tolerates to reduce preloada nd afterload, assess  trop further, asa maintain, he was very dysronous, attempt PC, back to PRVC, increase MV, he requires emergent paralysis to ventilate, peep to 10, fio2 to 70% likely, repeat abg, needs a line, consider repeat echo official for EF reduction? Stunned myocardium?, family on way in, stat pcxr in unit, will need continous versed and fent if parlysis drip required, assess cvp, keep cap coverage, he fluid given for sepsis unlikely contributed to any of this edema, more of stunned myocardium The patient is critically ill with multiple organ systems failure and requires high complexity decision making for assessment and support, frequent evaluation and titration of therapies, application of advanced monitoring technologies and extensive interpretation of multiple databases.   Critical Care Time devoted to patient care services described in this note is 90 Minutes. This time reflects time of care of this signee: Rory Percy, MD FACP. This critical care time does  not reflect procedure time, or teaching time or supervisory time of PA/NP/Med student/Med Resident etc but could involve care discussion time. Rest per NP/medical resident whose note is outlined above and that I agree with   Mcarthur Rossetti. Tyson Alias, MD, FACP Pgr: [phone] [facility] 12/25/2015 8:32 PM

## 2015-12-25 NOTE — Progress Notes (Signed)
    PO2 and pH improved FiO2 .60  Aggressive lasix, diuresis.  Appreciate CCM team.

## 2015-12-25 NOTE — Progress Notes (Signed)
ANTICOAGULATION CONSULT NOTE - Follow Up Consult  Pharmacy Consult for heparin Indication: NSTEMI  Labs:  Recent Labs  12/23/15 1932 12/23/15 1936 12/23/15 2026 12/24/15 0020 12/24/15 0839 12/24/15 1137 12/25/15 0320  HGB 15.3 17.7*  --   --  13.1  --  11.3*  HCT 46.2 52.0  --   --  39.7  --  36.3*  PLT 191  --   --   --  163  --  159  LABPROT  --   --  15.0  --   --   --   --   INR  --   --  1.16  --   --   --   --   HEPARINUNFRC  --   --   --   --   --  0.27* 0.16*  CREATININE 1.31* 1.10  --   --  1.27*  --   --   TROPONINI 3.06*  --   --  7.08* 13.64* 12.41*  --      Assessment: 60yo male subtherapeutic on heparin despite rate increase; cath this afternoon.  Goal of Therapy:  Heparin level 0.3-0.7 units/ml   Plan:  Will increase heparin gtt by 3 units/kg/hr to 1800 units/hr (will skip bolus 2/2 AAA and blood-tinged sputum yesterday, night RN reports no signs of blood) and check level in 6hr.  Vernard Gambles, PharmD, BCPS  12/25/2015,4:04 AM

## 2015-12-25 NOTE — Interval H&P Note (Signed)
History and Physical Interval Note:  12/25/2015 3:33 PM  Thomas Thomas  has presented today for surgery, with the diagnosis of non stemi  The various methods of treatment have been discussed with the patient and family. After consideration of risks, benefits and other options for treatment, the patient has consented to  Procedure(s): Left Heart Cath and Coronary Angiography (N/A) as a surgical intervention .  The patient's history has been reviewed, patient examined, no change in status, stable for surgery.  I have reviewed the patient's chart and labs.  Questions were answered to the patient's satisfaction.    Cath Lab Visit (complete for each Cath Lab visit)  Clinical Evaluation Leading to the Procedure:   ACS: Yes.    Non-ACS:    Anginal Classification: CCS III  Anti-ischemic medical therapy: Minimal Therapy (1 class of medications)  Non-Invasive Test Results: No non-invasive testing performed  Prior CABG: No previous CABG       Theron Arista [facility] 12/25/2015 3:33 PM

## 2015-12-25 NOTE — Procedures (Signed)
Arterial Catheter Insertion Procedure Note Thomas Thomas [id number] Feb 17, 1956  Procedure: Insertion of Arterial Catheter  Indications: Blood pressure monitoring  Procedure Details Consent: Unable to obtain consent because of emergent medical necessity. Time Out: Verified patient identification, verified procedure, site/side was marked, verified correct patient position, special equipment/implants available, medications/allergies/relevent history reviewed, required imaging and test results available.  Performed  Maximum sterile technique was used including antiseptics, cap, gloves, gown, hand hygiene, mask and sheet. Skin prep: Chlorhexidine; local anesthetic administered 20 gauge catheter was inserted into left radial artery using the Seldinger technique.  Evaluation Blood flow good; BP tracing good. Complications: No apparent complications.   Jennette Kettle 12/25/2015

## 2015-12-25 NOTE — Procedures (Signed)
Central Venous Catheter Insertion Procedure Note Thomas Thomas [id number] August 27, 1956  Procedure: Insertion of Central Venous Catheter Indications: Assessment of intravascular volume, Drug and/or fluid administration and Frequent blood sampling  Procedure Details Consent: Unable to obtain consent because of emergent medical necessity. Time Out: Verified patient identification, verified procedure, site/side was marked, verified correct patient position, special equipment/implants available, medications/allergies/relevent history reviewed, required imaging and test results available.  Performed  Maximum sterile technique was used including antiseptics, cap, gloves, gown, hand hygiene, mask and sheet. Skin prep: Chlorhexidine; local anesthetic administered A antimicrobial bonded/coated triple lumen catheter was placed in the left internal jugular vein using the Seldinger technique. Ultrasound guidance used.Yes.   Catheter placed to 22 cm. Blood aspirated via all 3 ports and then flushed x 3. Line sutured x 2 and dressing applied.  Evaluation Blood flow good Complications: No apparent complications Patient did tolerate procedure well. Chest X-ray ordered to verify placement.  CXR: pending.  Thomas Thomas, AGACNP-BC [facility] Pulmonology/Critical Care Pager [phone] or [phone]  12/25/2015 6:50 PM        Tolerated emergent [place]  Thomas Thomas. Thomas Alias, MD, FACP Pgr: [phone] [facility]
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
## 2015-12-25 NOTE — Progress Notes (Signed)
ANTICOAGULATION CONSULT NOTE - Follow Up Consult  Pharmacy Consult for heparin Indication: chest pain/ACS  No Known Allergies  Patient Measurements: Height:  (167.6 cm) Weight: 205 lb 12.8 oz (93.35 kg) IBW/kg (Calculated) : 63.8 Heparin Dosing Weight:   Vital Signs: Temp: 98.3 F (36.8 C) (03/01 0733) Temp Source: Oral (03/01 0733) BP: 141/91 mmHg (03/01 0733) Pulse Rate: 77 (03/01 0733)  Labs:  Recent Labs  12/23/15 1932 12/23/15 1936 12/23/15 2026 12/24/15 0020 12/24/15 0839 12/24/15 1137 12/25/15 0320 12/25/15 1016  HGB 15.3 17.7*  --   --  13.1  --  11.3*  --   HCT 46.2 52.0  --   --  39.7  --  36.3*  --   PLT 191  --   --   --  163  --  159  --   LABPROT  --   --  15.0  --   --   --   --   --   INR  --   --  1.16  --   --   --   --   --   HEPARINUNFRC  --   --   --   --   --  0.27* 0.16* 0.36  CREATININE 1.31* 1.10  --   --  1.27*  --   --   --   TROPONINI 3.06*  --   --  7.08* 13.64* 12.41*  --   --     Estimated Creatinine Clearance: 65.3 mL/min (by C-G formula based on Cr of 1.27).  Assessment: 60 year old male with pneumonia, lactic acidosis, non-ST elevation myocardial infarction, EKG with diffuse ST segment depression, elevation aVR concerning for global ischemia, triple-vessel coronary disease.  Heparin level currently therapeutic at 0.36 on heparin at 1800 units/hr.  Goal of Therapy:  Heparin level 0.3-0.7 units/ml Monitor platelets by anticoagulation protocol: Yes   Plan:  Continue IV heparin at current rate. Confirm heparin level at 1600 PM / or f/u p cath lab. Daily heparin level and CBC.  Tad Moore, BCPS  Clinical Pharmacist Pager [phone]  12/25/2015 11:29 AM

## 2015-12-25 NOTE — Anesthesia Procedure Notes (Signed)
Procedure Name: Intubation Date/Time: 12/25/2015 4:33 PM Performed by: Edmonia Caprio Pre-anesthesia Checklist: Patient identified, Emergency Drugs available, Suction available, Patient being monitored and Timeout performed Patient Re-evaluated:Patient Re-evaluated prior to inductionOxygen Delivery Method: Circle system utilized Preoxygenation: Pre-oxygenation with 100% oxygen Intubation Type: IV induction Ventilation: Mask ventilation without difficulty Laryngoscope Size: Miller and 2 Grade View: Grade I Tube type: Subglottic suction tube Tube size: 8.0 mm Number of attempts: 1 Airway Equipment and Method: Stylet Placement Confirmation: ETT inserted through vocal cords under direct vision Secured at: 23 cm Tube secured with: Tape Dental Injury: Teeth and Oropharynx as per pre-operative assessment  Comments: Called to intubate Thomas Thomas in cath lab.  RT effectively bagging patient and Sats 88%.  Dr. Michelle Piper at bedside, meds administered.  DL with McGrath, unable to visualize anything due to fulminant pulmonary edema.  DL with Hyacinth Meeker 2 with assistant holding yankaeur at vocal cords and able to visualize cords (would be Grade 1 view).  Suction moved, ETT advanced through cords.  02 sats increased to 95%, +BBS.  Unable to use easy cap due to pulmonary edema frothing out of ETT.  Tube secured by RT and placed on ventilator.

## 2015-12-25 NOTE — Progress Notes (Signed)
Right radial artery sheath removed and TR band in place.  12cc's of air placed in band.  No hematoma.  Site looks good.

## 2015-12-26 ENCOUNTER — Encounter (HOSPITAL_COMMUNITY): Payer: Self-pay | Admitting: Cardiology

## 2015-12-26 DIAGNOSIS — I2510 Atherosclerotic heart disease of native coronary artery without angina pectoris: Secondary | ICD-10-CM

## 2015-12-26 DIAGNOSIS — Z4659 Encounter for fitting and adjustment of other gastrointestinal appliance and device: Secondary | ICD-10-CM

## 2015-12-26 DIAGNOSIS — J9601 Acute respiratory failure with hypoxia: Secondary | ICD-10-CM

## 2015-12-26 DIAGNOSIS — J810 Acute pulmonary edema: Secondary | ICD-10-CM

## 2015-12-26 LAB — CBC WITH DIFFERENTIAL/PLATELET
BASOS PCT: 1 %
Basophils Absolute: 0.2 10*3/uL — ABNORMAL HIGH (ref 0.0–0.1)
EOS PCT: 0 %
Eosinophils Absolute: 0 10*3/uL (ref 0.0–0.7)
HCT: 40 % (ref 39.0–52.0)
HEMOGLOBIN: 12.7 g/dL — AB (ref 13.0–17.0)
LYMPHS PCT: 9 %
Lymphs Abs: 1.6 10*3/uL (ref 0.7–4.0)
MCH: 27.7 pg (ref 26.0–34.0)
MCHC: 31.8 g/dL (ref 30.0–36.0)
MCV: 87.3 fL (ref 78.0–100.0)
Monocytes Absolute: 1.4 10*3/uL — ABNORMAL HIGH (ref 0.1–1.0)
Monocytes Relative: 8 %
NEUTROS PCT: 82 %
Neutro Abs: 14.3 10*3/uL — ABNORMAL HIGH (ref 1.7–7.7)
Platelets: 164 10*3/uL (ref 150–400)
RBC: 4.58 MIL/uL (ref 4.22–5.81)
RDW: 15.1 % (ref 11.5–15.5)
WBC: 17.5 10*3/uL — AB (ref 4.0–10.5)

## 2015-12-26 LAB — POCT I-STAT 3, ART BLOOD GAS (G3+)
Acid-Base Excess: 2 mmol/L (ref 0.0–2.0)
BICARBONATE: 22.8 meq/L (ref 20.0–24.0)
BICARBONATE: 27.5 meq/L — AB (ref 20.0–24.0)
O2 SAT: 98 %
O2 SAT: 97 %
PCO2 ART: 31.7 mmHg — AB (ref 35.0–45.0)
PCO2 ART: 47.2 mmHg — AB (ref 35.0–45.0)
PH ART: 7.374 (ref 7.350–7.450)
PO2 ART: 91 mmHg (ref 80.0–100.0)
PO2 ART: 97 mmHg (ref 80.0–100.0)
Patient temperature: 98.4
Patient temperature: 99
TCO2: 24 mmol/L (ref 0–100)
TCO2: 29 mmol/L (ref 0–100)
pH, Arterial: 7.464 — ABNORMAL HIGH (ref 7.350–7.450)

## 2015-12-26 LAB — BASIC METABOLIC PANEL
Anion gap: 11 (ref 5–15)
BUN: 13 mg/dL (ref 6–20)
CHLORIDE: 105 mmol/L (ref 101–111)
CO2: 23 mmol/L (ref 22–32)
Calcium: 8 mg/dL — ABNORMAL LOW (ref 8.9–10.3)
Creatinine, Ser: 1.34 mg/dL — ABNORMAL HIGH (ref 0.61–1.24)
GFR calc Af Amer: >60 mL/min (ref >60)
GFR calc non Af Amer: 56 mL/min — ABNORMAL LOW (ref >60)
GLUCOSE: 116 mg/dL — AB (ref 65–99)
POTASSIUM: 3.7 mmol/L (ref 3.5–5.1)
Sodium: 139 mmol/L (ref 135–145)

## 2015-12-26 LAB — BLOOD GAS, ARTERIAL
Acid-base deficit: 0.2 mmol/L (ref 0.0–2.0)
Bicarbonate: 23.3 mEq/L (ref 20.0–24.0)
Drawn by: 24160
FIO2: 0.5
LHR: 22 {breaths}/min
O2 SAT: 95.4 %
PEEP/CPAP: 8 cmH2O
PO2 ART: 79.7 mmHg — AB (ref 80.0–100.0)
Patient temperature: 99
TCO2: 24.3 mmol/L (ref 0–100)
VT: 510 mL
pCO2 arterial: 34.1 mmHg — ABNORMAL LOW (ref 35.0–45.0)
pH, Arterial: 7.451 — ABNORMAL HIGH (ref 7.350–7.450)

## 2015-12-26 LAB — GLUCOSE, CAPILLARY: Glucose-Capillary: 126 mg/dL — ABNORMAL HIGH (ref 65–99)

## 2015-12-26 LAB — PROCALCITONIN: PROCALCITONIN: 0.71 ng/mL

## 2015-12-26 LAB — MAGNESIUM: Magnesium: 1.5 mg/dL — ABNORMAL LOW (ref 1.7–2.4)

## 2015-12-26 LAB — PHOSPHORUS: Phosphorus: 2.8 mg/dL (ref 2.5–4.6)

## 2015-12-26 MED ORDER — VITAL HIGH PROTEIN PO LIQD: 1000.0000 mL | ORAL | Status: DC

## 2015-12-26 MED ORDER — PRO-STAT SUGAR FREE PO LIQD
30.0000 mL | Freq: Three times a day (TID) | ORAL | Status: DC
  Administered 2015-12-26 – 2015-12-27 (×3): 30 mL
  Filled 2015-12-26 (×3): qty 30

## 2015-12-26 MED ORDER — ANTISEPTIC ORAL RINSE SOLUTION (CORINZ)
7.0000 mL | Freq: Four times a day (QID) | OROMUCOSAL | Status: DC
  Administered 2015-12-26 – 2015-12-27 (×6): 7 mL via OROMUCOSAL

## 2015-12-26 MED ORDER — VITAL HIGH PROTEIN PO LIQD
1000.0000 mL | ORAL | Status: DC
  Administered 2015-12-26: 1000 mL

## 2015-12-26 MED ORDER — FUROSEMIDE 10 MG/ML IJ SOLN
80.0000 mg | Freq: Two times a day (BID) | INTRAMUSCULAR | Status: DC
  Administered 2015-12-26 – 2015-12-28 (×5): 80 mg via INTRAVENOUS
  Filled 2015-12-26 (×5): qty 8

## 2015-12-26 MED ORDER — CHLORHEXIDINE GLUCONATE 0.12% ORAL RINSE (MEDLINE KIT)
15.0000 mL | Freq: Two times a day (BID) | OROMUCOSAL | Status: DC
  Administered 2015-12-26 – 2015-12-27 (×4): 15 mL via OROMUCOSAL

## 2015-12-26 MED ORDER — DOCUSATE SODIUM 50 MG/5ML PO LIQD
100.0000 mg | Freq: Two times a day (BID) | ORAL | Status: DC
  Administered 2015-12-26 – 2015-12-27 (×3): 100 mg via ORAL
  Filled 2015-12-26 (×5): qty 10

## 2015-12-26 MED ORDER — HEPARIN SODIUM (PORCINE) 5000 UNIT/ML IJ SOLN
5000.0000 [IU] | Freq: Three times a day (TID) | INTRAMUSCULAR | Status: DC
  Administered 2015-12-26 – 2016-01-01 (×20): 5000 [IU] via SUBCUTANEOUS
  Filled 2015-12-26 (×19): qty 1

## 2015-12-26 NOTE — Progress Notes (Signed)
Attempted to obtain sputum culture per MD order.  Unable to obtain sufficient amount for sample currently.  RN aware.

## 2015-12-26 NOTE — Progress Notes (Addendum)
Pharmacy Antibiotic Note  Thomas Thomas is a 60 y.o. male admitted on 12/23/2015 with CAP.  Pharmacy has been consulted for azithromycin and ceftriaxone dosing.  Day 3 of abx.  Cultures negative thus far.  Plan: 1. Continue ceftriaxone and azithromycin at current doses. 2. Stop date? 3. Pharmacy will sign off.  Please contact if questions, thanks!  Height:  (167.6 cm) Weight: 208 lb 5.4 oz (94.5 kg) IBW/kg (Calculated) : 63.8  Temp (24hrs), Avg:98.7 F (37.1 C), Min:97.7 F (36.5 C), Max:99 F (37.2 C)   Recent Labs Lab 12/23/15 1932 12/23/15 1936 12/24/15 0839 12/25/15 0320 12/26/15 0620  WBC 27.3*  --  26.6* 20.0* 17.5*  CREATININE 1.31* 1.10 1.27*  --  1.34*  LATICACIDVEN  --  2.46*  --   --   --     Estimated Creatinine Clearance: 62.3 mL/min (by C-G formula based on Cr of 1.34).    No Known Allergies  Antimicrobials this admission: Azithro 2/28 > CTX 2/28 >  Microbiology results: 2/27 BCx x 2 > ngtd 2/28 UCx > neg  Thank you for allowing pharmacy to be a part of this patient's care.  Tad Moore, BCPS  Clinical Pharmacist Pager [phone]  12/26/2015 2:03 PM
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
## 2015-12-26 NOTE — Consult Note (Signed)
PULMONARY / CRITICAL CARE MEDICINE   Name: Thomas Thomas MRN: [id number] DOB: June 29, 1956    ADMISSION DATE:  12/23/2015 CONSULTATION DATE:  12/25/2015  REFERRING MD:  TRH  CHIEF COMPLAINT:  Acute heart failure  HISTORY OF PRESENT ILLNESS:   60 year old male with PMH as below, which includes Hypertension and Anxiety. He first started with general malaise about 1 week prior to admission. He developed upper respiratory symptoms (productive cough) and presented to PCP and was treated as viral URI with nasal decongestant and expectorant. Symptoms did not improve and he presented to [facility] ED 2/27. In ED he was tachycardiac and hypertensive, with adequate sats on room air. ECG showed some depression in ST leads V5/6 and elevation in aVR. CXR showed some retrocardiac opacification. A CT angiogram was obtained and confirmed the presence of pneumonia and ruled out pulmonary embolism and aortic dissection; it also demonstrated a small fusiform dilation of the infrarenal abdominal aorta measuring 2.4 cm.He was admitted for sepsis secondary to CAP. He was transferred to [facility] for cardiology evaluation. 3/1 he went to cath lab and found critical 3 vessel obstructive CAD 80% proximal LAD, 75% diagonal, 100% LCx with left to left collaterals, 100% mid RCA with left to right collaterals. During the case he developed severe frank pulmonary edema requiring intubation. PCCM consulted.   SUBJECTIVE: no pressors, diuresing well  VITAL SIGNS: BP 95/68 mmHg  Pulse 82  Temp(Src) 99 F (37.2 C) (Oral)  Resp 30  Ht  (1.676 m)  Wt 94.5 kg (208 lb 5.4 oz)  BMI 33.64 kg/m2  SpO2 96%  HEMODYNAMICS: CVP:  [6 mmHg-8 mmHg] 6 mmHg  VENTILATOR SETTINGS: Vent Mode:  [-] PRVC FiO2 (%):  [50 %-100 %] 50 % Set Rate:  [18 bmp-35 bmp] 30 bmp Vt Set:  [500 mL-600 mL] 510 mL PEEP:  [10 cmH20-12 cmH20] 10 cmH20 Plateau Pressure:  [22 cmH20-23 cmH20] 22 cmH20  INTAKE / OUTPUT: I/O last 3 completed shifts: In: 7614.9  [P.O.:1320; I.V.:5994.9; IV Piggyback:300] Out: 1400 [Urine:1400]  PHYSICAL EXAMINATION: General:  Obese male in NAD Neuro:  rass -2, FC HEENT Perrl ett, beard Cardiovascular: RRR, no MRG Lungs:  Crackles improved Abdomen:  Obese, soft, non-distended Musculoskeletal:  Mild edema Skin:  Grossly intact  LABS:  BMET  Recent Labs Lab 12/23/15 1932 12/23/15 1936 12/24/15 0839  NA 133* 132* 138  K 3.3* 3.7 4.3  CL 92* 90* 105  CO2 27  --  25  BUN CREATININE 1.31* 1.10 1.27*  GLUCOSE 144* 138* 155*    Electrolytes  Recent Labs Lab 12/23/15 1932 12/24/15 0839  CALCIUM 8.8* 7.8*    CBC  Recent Labs Lab 12/23/15 1932 12/23/15 1936 12/24/15 0839 12/25/15 0320  WBC 27.3*  --  26.6* 20.0*  HGB 15.3 17.7* 13.1 11.3*  HCT 46.2 52.0 39.7 36.3*  PLT 191  --  163 159    Coag's  Recent Labs Lab 12/23/15 2026  INR 1.16    Sepsis Markers  Recent Labs Lab 12/23/15 1936 12/25/15 2027 12/26/15 0420  LATICACIDVEN 2.46*  --   --   PROCALCITON  --  0.48 0.71    ABG  Recent Labs Lab 12/25/15 1759 12/25/15 2040 12/26/15 0229  PHART 7.264* 7.324* 7.464*  PCO2ART 44.3 38.7 31.7*  PO2ART 180.0* 97.0 91.0    Liver Enzymes  Recent Labs Lab 12/24/15 0839  AST 72*  ALT 21  ALKPHOS 50  BILITOT 0.9  ALBUMIN 2.9*  Cardiac Enzymes  Recent Labs Lab 12/24/15 0020 12/24/15 0839 12/24/15 1137  TROPONINI 7.08* 13.64* 12.41*    Glucose  Recent Labs Lab 12/25/15 2018  GLUCAP 143*    Imaging Dg Chest Port 1 View  12/25/2015  CLINICAL DATA:  Central line placement EXAM: PORTABLE CHEST 1 VIEW COMPARISON:  Chest radiograph from earlier today. FINDINGS: Endotracheal tube tip is 2.6 cm above the carina. Left internal jugular central venous catheter terminates in the lower third of the superior vena cava. Enteric tube enters the stomach, with the tip not seen on this image. Stable cardiomediastinal silhouette with normal heart size. No  pneumothorax. Stable small left pleural effusion. Severe diffuse bilateral fluffy and linear parahilar opacities have worsened. IMPRESSION: 1. Well-positioned support hardware.  No pneumothorax. 2. Worsening severe diffuse bilateral parahilar fluffy and linear opacities, probably worsening severe pulmonary edema. Electronically Signed   By: Delbert Phenix M.D.   On: 12/25/2015 19:19   Dg Chest Port 1 View  12/25/2015  CLINICAL DATA:  Status post intubation after cardiac arrest today. Initial encounter. EXAM: PORTABLE CHEST 1 VIEW COMPARISON:  CT chest and single view of the chest 12/23/2015. FINDINGS: Endotracheal tube is in place with the tip in good position 4 cm above the carina. NG tube courses into the stomach. There is pulmonary edema. No pneumothorax or pleural effusion. Heart size is normal. IMPRESSION: ET tube and NG tube in good position. Pulmonary edema. Electronically Signed   By: Drusilla Kanner M.D.   On: 12/25/2015 18:07   Dg Abd Portable 1v  12/25/2015  CLINICAL DATA:  Encounter for orogastric tube placement. EXAM: PORTABLE ABDOMEN - 1 VIEW COMPARISON:  None. FINDINGS: The tip of the orogastric tube enters the proximal stomach. The side hole appears to be at or just below the GE junction. Stomach is moderately distended with air. IMPRESSION: Tip of the orogastric tube projects in the proximal stomach. Electronically Signed   By: Amie Portland M.D.   On: 12/25/2015 19:32     STUDIES:  CT angio chest/abd/pelv 2/27 > No evidence of an aortic dissection. Small fusiform dilation of the infrarenal abdominal aorta measuring 2.4 cm. Consider follow-up with ultrasound in 1 year to reassess size. Left lower lobe consolidation consistent with pneumonia as noted on the current chest radiograph. No other acute findings within the chest, abdomen or pelvis.  CULTURES: BCx2 2/27 >>> Urine cx 2/27 ngtd  ANTIBIOTICS: Rocephin 2/27 > Azithromycin 2/27 >  SIGNIFICANT EVENTS: 2/27 admit 3/1 LHC, pulm  edema, intubated 3/2- neg 2 liters  LINES/TUBES: ETT 3/1 >  DISCUSSION: 60 year old male admitted for sepsis and ?ACS, underwent cath 3/1 and found severe 3 vessel disease. During cath developed respiratory distress and frank pulmonary edema. Intubated in cath lab, PCCM to assume care.  ASSESSMENT / PLAN:  PULMONARY A: Acute hypoxemic respiratory failure secondary to pulmonary edema CAP  P:   ABG reviewed, can reduce rate, likely can reduce rate, repeat abg unlikely to get to min support to sbt, but will if able pcxr follow up Upright WUA mandatory Neg balance renal fxn tolerates  CARDIOVASCULAR A:  3 Vessel CAD Acute heart failure H/o Hypertension  P:  Cardiology primary MAP goal > Diurese Lasix, see renal, may be able to reduce Asa svo2 reassuring  RENAL A:   AKI Slight rise crt S/p minimal contrast P:   IVF to KVO Lasix reduction likley needed await chem Need bmet this am , mag , phos  GASTROINTESTINAL A:  No acute issues   P:   Protonix for SUP Start T F  HEMATOLOGIC A:   Anemia  P:  Follow CBC now and in am  Sub q hep  INFECTIOUS A:   CAP  P:   Continue ABX for cap PCT noted  ENDOCRINE A:   No acute issues   P:   Follow glucose on chemistry  NEUROLOGIC A:   Acute encephalopathy secondary to hypoxemia/medical sedation  P:   RASS goal: -1 Fentanyl infusion, need wua mandatory PRN versed, avoid as able WUA daily Chair as able   FAMILY  - Updates: pt in room  - Inter-disciplinary family meet or Palliative Care meeting due by:  3/8  Ccm time 30 min   Mcarthur Rossetti. Tyson Alias, MD, FACP Pgr: [phone] [facility] 12/26/2015 6:04 AM

## 2015-12-26 NOTE — Consult Note (Signed)
[address]       [place] [address]             [phone]        [name] [facility] Medical Record #[id number] Date of Birth: 04-22-1956  Referring: Ramonita Koenig [place] M.D.  Primary Care: Jannifer Rodney, FNP  Chief Complaint:    Chief Complaint  Patient presents with  . Dizziness   patient examined, coronary angiograms, echocardiogram, and chest x-ray and CT scan of chest personally reviewed  History of Present Illness:     60 year old Caucasian male reformed smoker admitted from an outside hospital with left lower lobe pneumonia and sepsis. He was tachycardic with nonspecific EKG changes but cardiac enzymes are positive. He was transferred to this hospital for cardiology evaluation and on March 1 he underwent cardiac catheterization was found to have severe three-vessel coronary disease including totally occluded RCA. During the catheterization he developed flash pulmonary edema and was intubated. His echocardiogram shows moderate inferior wall LV dysfunction without evidence of significant MR. He is being followed by critical care service and is being weaned from the ventilator. He has been hemodynamically stable since his cardiac catheterization and has required no pressor worse.   Current Activity/ Functional Status: Prior to admission the patient was independently living with normal ADLs   Zubrod Score: At the time of surgery this patient's most appropriate activity status/level should be described as: []     0    Normal activity, no symptoms []     1    Restricted in physical strenuous activity but ambulatory, able to do out light work [x]     2    Ambulatory and capable of self care, unable to do work activities, up and about                 more than 50%  Of the time                            []     3    Only limited self care, in bed greater than 50% of waking hours []     4    Completely disabled, no self care, confined to bed or chair []     5     Moribund  Past Medical History  Diagnosis Date  . Hypertension   . Anxiety     Past Surgical History  Procedure Laterality Date  . Knee surgery    . Hernia repair    . Cardiac catheterization N/A 12/25/2015    Procedure: Left Heart Cath and Coronary Angiography;  Surgeon: Brazen Domangue M [place], MD;  Location: [facility] INVASIVE CV LAB;  Service: Cardiovascular;  Laterality: N/A;    History  Smoking status  . Former Smoker  . Quit date: 06/06/2012  Smokeless tobacco  . Not on file    History  Alcohol Use No    Social History   Social History  . Marital Status: Married    Spouse Name: N/A  . Number of Children: N/A  . Years of Education: N/A   Occupational History  . Not on file.   Social History Main Topics  . Smoking status: Former Smoker    Quit date: 06/06/2012  . Smokeless tobacco: Not on file  . Alcohol Use: No  . Drug Use: No  . Sexual Activity: Not on file   Other Topics Concern  . Not on file   Social  History Narrative    No Known Allergies  Current Facility-Administered Medications  Medication Dose Route Frequency Provider Last Rate Last Dose  . 0.9 %  sodium chloride infusion  250 mL Intravenous PRN Olawale Marney M [place], MD      . 0.9 %  sodium chloride infusion  250 mL Intravenous PRN Spencer Cardinal M [place], MD 10 mL/hr at 12/25/15 1730 10 mL/hr at 12/25/15 1730  . 0.9 %  sodium chloride infusion   Intra-arterial PRN Nelda Bucks, MD      . acetaminophen (TYLENOL) tablet 650 mg  650 mg Oral Q6H PRN Houston Siren, MD       Or  . acetaminophen (TYLENOL) suppository 650 mg  650 mg Rectal Q6H PRN Houston Siren, MD      . antiseptic oral rinse solution (CORINZ)  7 mL Mouth Rinse QID Nelda Bucks, MD   7 mL at 12/26/15 1732  . aspirin EC tablet 81 mg  81 mg Oral Daily Jake Bathe, MD   81 mg at 12/26/15 1045  . atorvastatin (LIPITOR) tablet 80 mg  80 mg Oral q1800 Houston Siren, MD   80 mg at 12/26/15 1732  . azithromycin (ZITHROMAX) 500 mg in dextrose 5 % 250 mL IVPB  500 mg  Intravenous Q24H Glynn Octave, MD   500 mg at 12/25/15 2131  . cefTRIAXone (ROCEPHIN) 1 g in dextrose 5 % 50 mL IVPB  1 g Intravenous Q24H Glynn Octave, MD   1 g at 12/26/15 1934  . chlorhexidine gluconate (PERIDEX) 0.12 % solution 15 mL  15 mL Mouth Rinse BID Nelda Bucks, MD   15 mL at 12/26/15 0829  . docusate (COLACE) 50 MG/5ML liquid 100 mg  100 mg Per Tube BID PRN Nelda Bucks, MD      . docusate (COLACE) 50 MG/5ML liquid 100 mg  100 mg Oral BID Nelda Bucks, MD   100 mg at 12/26/15 1045  . feeding supplement (PRO-STAT SUGAR FREE 64) liquid 30 mL  30 mL Per Tube TID Nelda Bucks, MD   30 mL at 12/26/15 1731  . feeding supplement (VITAL HIGH PROTEIN) liquid 1,000 mL  1,000 mL Per Tube Continuous Nelda Bucks, MD 20 mL/hr at 12/26/15 1129 1,000 mL at 12/26/15 1129  . fentaNYL (SUBLIMAZE) 2,500 mcg in sodium chloride 0.9 % 250 mL (10 mcg/mL) infusion  25-400 mcg/hr Intravenous Continuous Nelda Bucks, MD 10 mL/hr at 12/25/15 1825 100 mcg/hr at 12/25/15 1825  . fentaNYL (SUBLIMAZE) bolus via infusion 25 mcg  25 mcg Intravenous Q1H PRN Nelda Bucks, MD      . fentaNYL (SUBLIMAZE) injection 50 mcg  50 mcg Intravenous Once Nelda Bucks, MD      . furosemide (LASIX) injection 80 mg  80 mg Intravenous Q12H Nelda Bucks, MD   80 mg at 12/26/15 1731  . heparin injection 5,000 Units  5,000 Units Subcutaneous 3 times per day Nelda Bucks, MD   5,000 Units at 12/26/15 1447  . midazolam (VERSED) 50 mg in sodium chloride 0.9 % 50 mL (1 mg/mL) infusion  0-10 mg/hr Intravenous Continuous Duayne Cal, NP 5 mL/hr at 12/26/15 1700 5 mg/hr at 12/26/15 1700  . midazolam (VERSED) injection 2 mg  2 mg Intravenous Q15 min PRN Nelda Bucks, MD   2 mg at 12/25/15 1824  . midazolam (VERSED) injection 2 mg  2 mg Intravenous Q2H PRN Nelda Bucks, MD      .  nitroGLYCERIN 50 mg in dextrose 5 % 250 mL (0.2 mg/mL) infusion  0-200 mcg/min Intravenous  Titrated Narda Fundora M [place], MD 3 mL/hr at 12/25/15 1633 10 mcg/min at 12/25/15 1633  . norepinephrine (LEVOPHED) 16 mg in dextrose 5 % 250 mL (0.064 mg/mL) infusion  0-40 mcg/min Intravenous Titrated Nelda Bucks, MD   Stopped at 12/25/15 1825  . ondansetron (ZOFRAN) tablet 4 mg  4 mg Oral Q6H PRN Houston Siren, MD       Or  . ondansetron [facility]) injection 4 mg  4 mg Intravenous Q6H PRN Houston Siren, MD      . pantoprazole (PROTONIX) injection 40 mg  40 mg Intravenous Q24H Duayne Cal, NP   40 mg at 12/26/15 1731  . sodium chloride flush (NS) 0.9 % injection 3 mL  3 mL Intravenous Q12H Houston Siren, MD   3 mL at 12/24/15 2200  . sodium chloride flush (NS) 0.9 % injection 3 mL  3 mL Intravenous Q12H Brayon Bielefeld M [place], MD   3 mL at 12/25/15 2115  . sodium chloride flush (NS) 0.9 % injection 3 mL  3 mL Intravenous PRN Thresa Dozier M [place], MD      . sodium chloride flush (NS) 0.9 % injection 3 mL  3 mL Intravenous Q12H Deauna Yaw M [place], MD   10 mL at 12/25/15 2224  . sodium chloride flush (NS) 0.9 % injection 3 mL  3 mL Intravenous PRN Kemoni Ortega M [place], MD        Prescriptions prior to admission  Medication Sig Dispense Refill Last Dose  . hydrochlorothiazide (HYDRODIURIL) 25 MG tablet Take 1 tablet (25 mg total) by mouth daily. 90 tablet 1 12/23/2015 at Unknown time  . naproxen (NAPROSYN) 375 MG tablet Take 375 mg by mouth 2 (two) times daily with a meal. Reported on 12/16/2015   12/22/2015 at Unknown time    Family History  Problem Relation Age of Onset  . Heart disease Mother   . Cancer Father     stomach     Review of Systems:  The patient is sedated on the ventilator and unable to be interviewed for review systems. There is no family member present for the examination or to provide review of systems.     Cardiac Review of Systems: Y or N  Chest Pain [    ]  Resting SOB [   ] Exertional SOB  [  ]  Orthopnea [  ]   Pedal Edema [   ]    Palpitations [  ] Syncope  [  ]   Presyncope [   ]  General Review of  Systems: [Y] = yes [  ]=no Constitional: recent weight change [  ]; anorexia [  ]; fatigue [  ]; nausea [  ]; night sweats [  ]; fever [  ]; or chills [  ]                                                               Dental: poor dentition[  ]; Last Dentist visit:   Eye : blurred vision [  ]; diplopia [   ]; vision changes [  ];  Amaurosis fugax[  ]; Resp: cough [  ];  wheezing[  ];  hemoptysis[  ];  shortness of breath[  ]; paroxysmal nocturnal dyspnea[  ]; dyspnea on exertion[  ]; or orthopnea[  ];  GI:  gallstones[  ], vomiting[  ];  dysphagia[  ]; melena[  ];  hematochezia [  ]; heartburn[  ];   Hx of  Colonoscopy[  ]; GU: kidney stones [  ]; hematuria[  ];   dysuria [  ];  nocturia[  ];  history of     obstruction [  ]; urinary frequency [  ]             Skin: rash, swelling[  ];, hair loss[  ];  peripheral edema[  ];  or itching[  ]; Musculosketetal: myalgias[  ];  joint swelling[  ];  joint erythema[  ];  joint pain[  ];  back pain[  ];  Heme/Lymph: bruising[  ];  bleeding[  ];  anemia[  ];  Neuro: TIA[  ];  headaches[  ];  stroke[  ];  vertigo[  ];  seizures[  ];   paresthesias[  ];  difficulty walking[  ];  Psych:depression[  ]; anxiety[  ];  Endocrine: diabetes[  ];  thyroid dysfunction[  ];  Immunizations: Flu [  ]; Pneumococcal[  ];  Other:  Physical Exam: BP 101/56 mmHg  Pulse 83  Temp(Src) 98.9 F (37.2 C) (Oral)  Resp 22  Ht 5\' 6"  (1.676 m)  Wt 208 lb 5.4 oz (94.5 kg)  BMI 33.64 kg/m2  SpO2 95%       Physical Exam  General: Moderately obese Caucasian male sedated on the ventilator with stable vital signs. CVP today is 5. He is on 40% FiO2. HEENT: Normocephalic pupils equal , dentition adequate Neck: Supple without JVD, adenopathy, or bruit Chest: Bilateral basilar rales, scattered rhonchi, no tenderness             or deformity Cardiovascular: Regular rate and rhythm, no murmur, no gallop, peripheral pulses             palpable in all extremities Abdomen:  Soft,  nontender, no palpable mass or organomegaly Extremities: Warm, well-perfused, no clubbing cyanosis edema or tenderness,              no venous stasis changes of the legs Rectal/GU: Deferred Neuro: Grossly non--focal and symmetrical throughout Skin: Clean and dry without rash or ulceration   Diagnostic Studies & Laboratory data:     Recent Radiology Findings:   Dg Chest Port 1 View  12/25/2015  CLINICAL DATA:  Central line placement EXAM: PORTABLE CHEST 1 VIEW COMPARISON:  Chest radiograph from earlier today. FINDINGS: Endotracheal tube tip is 2.6 cm above the carina. Left internal jugular central venous catheter terminates in the lower third of the superior vena cava. Enteric tube enters the stomach, with the tip not seen on this image. Stable cardiomediastinal silhouette with normal heart size. No pneumothorax. Stable small left pleural effusion. Severe diffuse bilateral fluffy and linear parahilar opacities have worsened. IMPRESSION: 1. Well-positioned support hardware.  No pneumothorax. 2. Worsening severe diffuse bilateral parahilar fluffy and linear opacities, probably worsening severe pulmonary edema. Electronically Signed   By: Delbert Phenix M.D.   On: 12/25/2015 19:19   Dg Chest Port 1 View  12/25/2015  CLINICAL DATA:  Status post intubation after cardiac arrest today. Initial encounter. EXAM: PORTABLE CHEST 1 VIEW COMPARISON:  CT chest and single view of the chest 12/23/2015. FINDINGS: Endotracheal tube is in place with the tip in good position 4 cm above the carina. NG  tube courses into the stomach. There is pulmonary edema. No pneumothorax or pleural effusion. Heart size is normal. IMPRESSION: ET tube and NG tube in good position. Pulmonary edema. Electronically Signed   By: Drusilla Kanner M.D.   On: 12/25/2015 18:07   Dg Abd Portable 1v  12/25/2015  CLINICAL DATA:  Encounter for orogastric tube placement. EXAM: PORTABLE ABDOMEN - 1 VIEW COMPARISON:  None. FINDINGS: The tip of the  orogastric tube enters the proximal stomach. The side hole appears to be at or just below the GE junction. Stomach is moderately distended with air. IMPRESSION: Tip of the orogastric tube projects in the proximal stomach. Electronically Signed   By: Amie Portland M.D.   On: 12/25/2015 19:32     I have independently reviewed the above radiologic studies.  Recent Lab Findings: Lab Results  Component Value Date   WBC 17.5* 12/26/2015   HGB 12.7* 12/26/2015   HCT 40.0 12/26/2015   PLT 164 12/26/2015   GLUCOSE 116* 12/26/2015   CHOL 112 12/24/2015   TRIG 25 12/24/2015   HDL 30* 12/24/2015   LDLCALC 77 12/24/2015   ALT 21 12/24/2015   AST 72* 12/24/2015   NA 139 12/26/2015   K 3.7 12/26/2015   CL 105 12/26/2015   CREATININE 1.34* 12/26/2015   BUN 13 12/26/2015   CO2 23 12/26/2015   TSH 1.538 12/24/2015   INR 1.16 12/23/2015   HGBA1C 6.7* 12/24/2015      Assessment / Plan:     The patient has severe multivessel coronary disease with moderately reduced LV function. The stress of his sepsis-pneumonia probably was a trigger for his MI. When he recovers from his pulmonary infection, is ambulatory and has adequate nutritional status he would be candidate for multivessel bypass grafting. Hopefully this will be possible late next week during this hospitalization. We'll follow.

## 2015-12-26 NOTE — Progress Notes (Addendum)
Initial Nutrition Assessment  DOCUMENTATION CODES:   Obesity unspecified  INTERVENTION:  -Initiate permitted hypocaloric feedings via OGT with Vital High Protein, begin at 59mL/hr increase by 10 every 8 hours to goal rate of 67mL/hr -ProStat 30mL TID, each supplement provides 15g protein and 100 calories -Tubefeeding regimen provides 1260 calories, 132g protein, and 802cc free water. -Monitor Mg, Phos, K for 3 days, MD replete as necessary, pt is at risk of refeeding.   NUTRITION DIAGNOSIS:   Inadequate oral intake related to inability to eat as evidenced by NPO status.  GOAL:   Provide needs based on ASPEN/SCCM guidelines  MONITOR:   Vent status, Labs, I & O's, TF tolerance, Skin  REASON FOR ASSESSMENT:   Consult Enteral/tube feeding initiation and management  ASSESSMENT:   Thomas Thomas is an 60 y.o. male with hx of HTN, anxiety, prior tobacco abuse, presented to the ER with persistent back pain, coughs, and malaise. He was seen in the urgent care, and was given levoquin though he hadn't started as yet. 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  Patient is currently intubated on ventilator support MV: 11.6 L/min Temp (24hrs), Avg:98.6 F (37 C), Min:97.7 F (36.5 C), Max:99 F (37.2 C)  Propofol: None   Mr. Slagel presented with a diagnosis of NSTEMI, triple-vessel coronary disease verified by cardiac catheterization yesterday. During catheterization, he developed pulmonary edema 2/2 global ischemia, with diastolic BP of 45 mmHg, was intubated. He will undergo bypass-surgery following recovery from this event.  Spoke with family briefly at bedside. They endorse a good appetite for pt. He was consuming 100% of meals prior to intubation. Deny weight loss. Deny chewing/swallowing problems. No nausea/vomiting recently.   Nutrition-Focused physical exam completed. Findings  are no fat depletion, no muscle depletion, and no edema.   Will initiate permitted hypocaloric feedings. Per RN, Likely pt will not be intubated long.  Labs: Mg 1.5, K 3.7, Phos 2.8  Diet Order:     Skin:  Reviewed, no issues  Last BM:  2/26  Height:   Ht Readings from Last 1 Encounters:  12/24/15  (1.676 m)    Weight:   Wt Readings from Last 1 Encounters:  12/26/15 208 lb 5.4 oz (94.5 kg)    Ideal Body Weight:  64.54 kg  BMI:  Body mass index is 33.64 kg/(m^2).  Estimated Nutritional Needs:   Kcal:  4098-1191  Protein:  130 grams  Fluid:  Per MD  EDUCATION NEEDS:   No education needs identified at this time  Thomas Thomas. Thomas Ciano, MS, RD LDN After Hours/Weekend Pager [phone]

## 2015-12-27 ENCOUNTER — Inpatient Hospital Stay (HOSPITAL_COMMUNITY): Payer: BLUE CROSS/BLUE SHIELD

## 2015-12-27 ENCOUNTER — Other Ambulatory Visit: Payer: Self-pay | Admitting: *Deleted

## 2015-12-27 DIAGNOSIS — I2510 Atherosclerotic heart disease of native coronary artery without angina pectoris: Secondary | ICD-10-CM | POA: Insufficient documentation

## 2015-12-27 DIAGNOSIS — I25119 Atherosclerotic heart disease of native coronary artery with unspecified angina pectoris: Secondary | ICD-10-CM | POA: Insufficient documentation

## 2015-12-27 LAB — COMPREHENSIVE METABOLIC PANEL
ALT: 15 U/L — ABNORMAL LOW (ref 17–63)
ANION GAP: 10 (ref 5–15)
AST: 21 U/L (ref 15–41)
Albumin: 2.5 g/dL — ABNORMAL LOW (ref 3.5–5.0)
Alkaline Phosphatase: 46 U/L (ref 38–126)
BUN: 25 mg/dL — ABNORMAL HIGH (ref 6–20)
CALCIUM: 8.5 mg/dL — AB (ref 8.9–10.3)
CHLORIDE: 99 mmol/L — AB (ref 101–111)
CO2: 29 mmol/L (ref 22–32)
Creatinine, Ser: 1.34 mg/dL — ABNORMAL HIGH (ref 0.61–1.24)
GFR calc non Af Amer: 56 mL/min — ABNORMAL LOW (ref >60)
Glucose, Bld: 133 mg/dL — ABNORMAL HIGH (ref 65–99)
Potassium: 3.3 mmol/L — ABNORMAL LOW (ref 3.5–5.1)
SODIUM: 138 mmol/L (ref 135–145)
Total Bilirubin: 0.5 mg/dL (ref 0.3–1.2)
Total Protein: 6 g/dL — ABNORMAL LOW (ref 6.5–8.1)

## 2015-12-27 LAB — BLOOD GAS, ARTERIAL
ACID-BASE EXCESS: 3.8 mmol/L — AB (ref 0.0–2.0)
Bicarbonate: 27.2 mEq/L — ABNORMAL HIGH (ref 20.0–24.0)
DRAWN BY: 449561
FIO2: 0.4
MECHVT: 510 mL
O2 SAT: 96.6 %
PATIENT TEMPERATURE: 98.6
PEEP/CPAP: 5 cmH2O
PH ART: 7.476 — AB (ref 7.350–7.450)
PO2 ART: 90.7 mmHg (ref 80.0–100.0)
RATE: 22 resp/min
TCO2: 28.4 mmol/L (ref 0–100)
pCO2 arterial: 37.4 mmHg (ref 35.0–45.0)

## 2015-12-27 LAB — CBC WITH DIFFERENTIAL/PLATELET
BASOS PCT: 1 %
Basophils Absolute: 0.2 10*3/uL — ABNORMAL HIGH (ref 0.0–0.1)
EOS ABS: 0.2 10*3/uL (ref 0.0–0.7)
EOS PCT: 1 %
HEMATOCRIT: 37.4 % — AB (ref 39.0–52.0)
Hemoglobin: 12.5 g/dL — ABNORMAL LOW (ref 13.0–17.0)
LYMPHS PCT: 10 %
Lymphs Abs: 1.5 10*3/uL (ref 0.7–4.0)
MCH: 29.1 pg (ref 26.0–34.0)
MCHC: 33.4 g/dL (ref 30.0–36.0)
MCV: 87 fL (ref 78.0–100.0)
Monocytes Absolute: 1.7 10*3/uL — ABNORMAL HIGH (ref 0.1–1.0)
Monocytes Relative: 11 %
NEUTROS PCT: 77 %
Neutro Abs: 11.6 10*3/uL — ABNORMAL HIGH (ref 1.7–7.7)
Platelets: 160 10*3/uL (ref 150–400)
RBC: 4.3 MIL/uL (ref 4.22–5.81)
RDW: 14.8 % (ref 11.5–15.5)
WBC: 15.2 10*3/uL — ABNORMAL HIGH (ref 4.0–10.5)

## 2015-12-27 LAB — CARBOXYHEMOGLOBIN
CARBOXYHEMOGLOBIN: 1.2 % (ref 0.5–1.5)
Methemoglobin: 1 % (ref 0.0–1.5)
O2 SAT: 65.1 %
TOTAL HEMOGLOBIN: 12.4 g/dL — AB (ref 13.5–18.0)

## 2015-12-27 LAB — GLUCOSE, CAPILLARY
GLUCOSE-CAPILLARY: 102 mg/dL — AB (ref 65–99)
GLUCOSE-CAPILLARY: 116 mg/dL — AB (ref 65–99)

## 2015-12-27 LAB — SURGICAL PCR SCREEN
MRSA, PCR: NEGATIVE
Staphylococcus aureus: NEGATIVE

## 2015-12-27 LAB — PROCALCITONIN: PROCALCITONIN: 0.45 ng/mL

## 2015-12-27 LAB — PREALBUMIN: Prealbumin: 13.7 mg/dL — ABNORMAL LOW (ref 18–38)

## 2015-12-27 MED ORDER — CETYLPYRIDINIUM CHLORIDE 0.05 % MT LIQD
7.0000 mL | Freq: Two times a day (BID) | OROMUCOSAL | Status: DC
  Administered 2015-12-27 – 2016-01-01 (×10): 7 mL via OROMUCOSAL

## 2015-12-27 MED ORDER — PANTOPRAZOLE SODIUM 40 MG PO PACK: 40.0000 mg | PACK | Freq: Every day | ORAL | Status: DC

## 2015-12-27 MED ORDER — PANTOPRAZOLE SODIUM 40 MG PO TBEC
40.0000 mg | DELAYED_RELEASE_TABLET | Freq: Every day | ORAL | Status: DC
  Administered 2015-12-27 – 2016-01-01 (×6): 40 mg via ORAL
  Filled 2015-12-27 (×8): qty 1

## 2015-12-27 MED ORDER — POTASSIUM CHLORIDE CRYS ER 20 MEQ PO TBCR
40.0000 meq | EXTENDED_RELEASE_TABLET | Freq: Once | ORAL | Status: AC
  Administered 2015-12-27: 40 meq via ORAL
  Filled 2015-12-27: qty 2

## 2015-12-27 MED ORDER — DOCUSATE SODIUM 100 MG PO CAPS
100.0000 mg | ORAL_CAPSULE | Freq: Two times a day (BID) | ORAL | Status: DC
  Administered 2015-12-27 – 2016-01-01 (×10): 100 mg via ORAL
  Filled 2015-12-27 (×10): qty 1

## 2015-12-27 MED ORDER — MAGNESIUM SULFATE 2 GM/50ML IV SOLN
2.0000 g | Freq: Once | INTRAVENOUS | Status: AC
  Administered 2015-12-27: 2 g via INTRAVENOUS
  Filled 2015-12-27: qty 50

## 2015-12-27 MED ORDER — POTASSIUM CHLORIDE 20 MEQ/15ML (10%) PO SOLN
40.0000 meq | Freq: Two times a day (BID) | ORAL | Status: DC
  Administered 2015-12-27: 40 meq via ORAL
  Filled 2015-12-27: qty 30

## 2015-12-27 MED ORDER — POTASSIUM CHLORIDE CRYS ER 20 MEQ PO TBCR: 40.0000 meq | EXTENDED_RELEASE_TABLET | Freq: Two times a day (BID) | ORAL | Status: DC

## 2015-12-27 NOTE — Progress Notes (Signed)
100cc of Fentanyl wasted down the sink with Dub AmisSarah Bethany Hirt, RN. 25cc of Versed wasted down the sink with Dub AmisSarah Stafford Riviera, RN.

## 2015-12-27 NOTE — Procedures (Signed)
Extubation Procedure Note  Patient Details:   Name: Thomas CollinDennis Thomas DOB: 04/16/1956 MRN: [id number]   Airway Documentation:  Airway 8 mm (Active)  Secured at (cm) 25 cm 12/27/2015  8:39 AM  Measured From Lips 12/27/2015  8:39 AM  Secured Location Center 12/27/2015  8:39 AM  Secured By [employer] Tube Holder 12/27/2015  8:39 AM  Tube Holder Repositioned Yes 12/27/2015  8:39 AM  Cuff Pressure (cm H2O) 22 cm H2O 12/27/2015  3:30 AM  Site Condition Dry 12/27/2015  8:39 AM  Pt extubated to 4lpm [place], incentive volume achieved 1000cc's x10.  Evaluation  O2 sats: stable throughout Complications: No apparent complications Patient did tolerate procedure well. Bilateral Breath Sounds: Clear, Diminished Suctioning: Oral Yes  [name] [name] 12/27/2015, 12:43 PM
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
## 2015-12-27 NOTE — Consult Note (Signed)
PULMONARY / CRITICAL CARE MEDICINE   Name: Thomas Thomas MRN: [id number] DOB: 03/18/1956    ADMISSION DATE:  12/23/2015 CONSULTATION DATE:  12/25/2015  REFERRING MD:  TRH  CHIEF COMPLAINT:  Acute heart failure  HISTORY OF PRESENT ILLNESS:   60 year old male with PMH as below, which includes Hypertension and Anxiety. He first started with general malaise about 1 week prior to admission. He developed upper respiratory symptoms (productive cough) and presented to PCP and was treated as viral URI with nasal decongestant and expectorant. Symptoms did not improve and he presented to [facility] ED 2/27. In ED he was tachycardiac and hypertensive, with adequate sats on room air. ECG showed some depression in ST leads V5/6 and elevation in aVR. CXR showed some retrocardiac opacification. A CT angiogram was obtained and confirmed the presence of pneumonia and ruled out pulmonary embolism and aortic dissection; it also demonstrated a small fusiform dilation of the infrarenal abdominal aorta measuring 2.4 cm.He was admitted for sepsis secondary to CAP. [facility]. 3/1 he went to cath lab and found critical 3 vessel obstructive CAD 80% proximal LAD, 75% diagonal, 100% LCx with left to left collaterals, 100% mid RCA with left to right collaterals. During the case he developed severe frank pulmonary edema requiring intubation. PCCM consulted.   SUBJECTIVE: weaning,neg balance  VITAL SIGNS: BP 114/72 mmHg  Pulse 96  Temp(Src) 98.9 F (37.2 C) (Oral)  Resp 18  Ht 5\' 6"  (1.676 m)  Wt 91.3 kg (201 lb 4.5 oz)  BMI 32.50 kg/m2  SpO2 94%  HEMODYNAMICS: CVP:  [1 mmHg-9 mmHg] 6 mmHg  VENTILATOR SETTINGS: Vent Mode:  [-] PRVC FiO2 (%):  [40 %-50 %] 40 % Set Rate:  [22 bmp] 22 bmp Vt Set:  [510 mL] 510 mL PEEP:  [5 cmH20-8 cmH20] 5 cmH20 Plateau Pressure:  [17 cmH20-20 cmH20] 17 cmH20  INTAKE / OUTPUT: I/O last 3 completed shifts: In: 2317 [I.V.:917; NG/GT:800; IV  Piggyback:600] Out: 7825 [Urine:7825]  PHYSICAL EXAMINATION: General:  Obese male in NAD Neuro:  rass 0 , FC well HEENT Perrl ett, beard Cardiovascular: RRR, no MRG Lungs:  Crackles improved to clear Abdomen:  Obese, soft, non-distended Musculoskeletal:  Mild edema Skin:  Grossly intact  LABS:  BMET  Recent Labs Lab 12/24/15 0839 12/26/15 0620 12/27/15 0430  NA 138 139 138  K 4.3 3.7 3.3*  CL 105 105 99*  CO2 25 23 29   BUN 13 13 25*  CREATININE 1.27* 1.34* 1.34*  GLUCOSE 155* 116* 133*    Electrolytes  Recent Labs Lab 12/24/15 0839 12/26/15 0620 12/27/15 0430  CALCIUM 7.8* 8.0* 8.5*  MG  --  1.5*  --   PHOS  --  2.8  --     CBC  Recent Labs Lab 12/25/15 0320 12/26/15 0620 12/27/15 0430  WBC 20.0* 17.5* 15.2*  HGB 11.3* 12.7* 12.5*  HCT 36.3* 40.0 37.4*  PLT 159 164 160    Coag's  Recent Labs Lab 12/23/15 2026  INR 1.16    Sepsis Markers  Recent Labs Lab 12/23/15 1936 12/25/15 2027 12/26/15 0420 12/27/15 0430  LATICACIDVEN 2.46*  --   --   --   PROCALCITON  --  0.48 0.71 0.45    ABG  Recent Labs Lab 12/26/15 0810 12/26/15 1625 12/27/15 0348  PHART 7.451* 7.374 7.476*  PCO2ART 34.1* 47.2* 37.4  PO2ART 79.7* 97.0 90.7    Liver Enzymes  Recent Labs Lab 12/24/15 0839 12/27/15  0430  AST 72* 21  ALT 21 15*  ALKPHOS 50 46  BILITOT 0.9 0.5  ALBUMIN 2.9* 2.5*    Cardiac Enzymes  Recent Labs Lab 12/24/15 0020 12/24/15 0839 12/24/15 1137  TROPONINI 7.08* 13.64* 12.41*    Glucose  Recent Labs Lab 12/25/15 2018 12/26/15 2042 12/27/15 0051 12/27/15 0604  GLUCAP 143* 126* 102* 116*    Imaging Dg Chest Port 1 View  12/27/2015  CLINICAL DATA:  Shortness of breath. EXAM: PORTABLE CHEST 1 VIEW COMPARISON:  12/25/2015. FINDINGS: Endotracheal tube, NG tube, left IJ line stable position. Cardiomegaly with bilateral pulmonary infiltrates/edema, improved from prior exam. Small left pleural effusion . No pneumothorax. No  acute bony abnormality. IMPRESSION: 1. Lines and tubes in stable position. 2. Cardiomegaly with bilateral pulmonary infiltrates/edema, improved from prior exam. Findings consistent improving congestive heart failure. Small left pleural effusion. Electronically Signed   By: Maisie Fus  Register   On: 12/27/2015 07:12     STUDIES:  CT angio chest/abd/pelv 2/27 > No evidence of an aortic dissection. Small fusiform dilation of the infrarenal abdominal aorta measuring 2.4 cm. Consider follow-up with ultrasound in 1 year to reassess size. Left lower lobe consolidation consistent with pneumonia as noted on the current chest radiograph. No other acute findings within the chest, abdomen or pelvis.  CULTURES: BCx2 2/27 >>> Urine cx 2/27 ngtd  ANTIBIOTICS: Rocephin 2/27 >add stop date 7 days total Azithromycin 2/27 >3/3  SIGNIFICANT EVENTS: 2/27 admit 3/1 LHC, pulm edema, intubated 3/2- neg 2 liters 3/3 neg 2 liters again  LINES/TUBES: ETT 3/1 > IJ 3/1>>>  DISCUSSION: 60 year old male admitted for sepsis and ?ACS, underwent cath 3/1 and found severe 3 vessel disease. During cath developed respiratory distress and frank pulmonary edema. Intubated in cath lab, PCCM to assume care.  ASSESSMENT / PLAN:  PULMONARY A: Acute hypoxemic respiratory failure secondary to pulmonary edema CAP  P:   pcxr better SBT Neg 2 liters, repeat goals abx course Goal 2 hr, cpap 5 ps5, assess rsbi  CARDIOVASCULAR A:  3 Vessel CAD Acute heart failure H/o Hypertension  P:  Cardiology primary MAP goal > Asa Lasix same dose CABg next week likely ,his infection is very unimpressive  RENAL A:   AKI Slight rise crt hypoK S/p minimal contrast P:   Imag, k supp kvio Lasix Chem in am   GASTROINTESTINAL A:   No acute issues   P:   Protonix for SUP Start T F hold for weaning  HEMATOLOGIC A:   Anemia  P:  Follow CBC now and in am  Sub q hep  INFECTIOUS A:   CAP  P:    Continue ABX for cap, dc azithro unilaterkla process Add stop date ceftriaxone PCT noted  ENDOCRINE A:   No acute issues   P:   Follow glucose on chemistry  NEUROLOGIC A:   Acute encephalopathy secondary to hypoxemia/medical sedation  P:   RASS goal: -1 Fentanyl infusion off PRN versed, avoid as able WUA daily Chair as able   FAMILY  - Updates: pt in room and girlfriends   - Inter-disciplinary family meet or Palliative Care meeting due by:  3/8  Ccm time 30 min   Mcarthur Rossetti. Tyson Alias, MD, FACP Pgr: [phone] [facility] 12/27/2015 12:26 PM

## 2015-12-28 ENCOUNTER — Inpatient Hospital Stay (HOSPITAL_COMMUNITY): Payer: BLUE CROSS/BLUE SHIELD

## 2015-12-28 DIAGNOSIS — R9431 Abnormal electrocardiogram [ECG] [EKG]: Secondary | ICD-10-CM | POA: Insufficient documentation

## 2015-12-28 LAB — CULTURE, BLOOD (ROUTINE X 2)
CULTURE: NO GROWTH
Culture: NO GROWTH

## 2015-12-28 LAB — CARBOXYHEMOGLOBIN
CARBOXYHEMOGLOBIN: 0.7 % (ref 0.5–1.5)
METHEMOGLOBIN: 0.9 % (ref 0.0–1.5)
O2 Saturation: 70.3 %
Total hemoglobin: 13.8 g/dL (ref 13.5–18.0)

## 2015-12-28 LAB — MAGNESIUM: MAGNESIUM: 2.4 mg/dL (ref 1.7–2.4)

## 2015-12-28 LAB — PHOSPHORUS: PHOSPHORUS: 4.7 mg/dL — AB (ref 2.5–4.6)

## 2015-12-28 MED ORDER — MAGNESIUM HYDROXIDE 400 MG/5ML PO SUSP
30.0000 mL | Freq: Every day | ORAL | Status: DC | PRN
  Administered 2015-12-28: 30 mL via ORAL
  Filled 2015-12-28: qty 30

## 2015-12-28 MED ORDER — FUROSEMIDE 40 MG PO TABS
40.0000 mg | ORAL_TABLET | Freq: Every day | ORAL | Status: DC
  Administered 2015-12-28 – 2016-01-01 (×5): 40 mg via ORAL
  Filled 2015-12-28 (×5): qty 1

## 2015-12-28 MED ORDER — FUROSEMIDE 10 MG/ML IJ SOLN: 40.0000 mg | Freq: Every day | INTRAMUSCULAR | Status: DC

## 2015-12-28 NOTE — Progress Notes (Signed)
Pt had aline in left radial that was not charted in epic, aline d/c at 1121, pressure was held for 10 minutes, dry gauze with occlusive Tegaderm placed over the gauze. Site dry, intact, 3+ radial pulse with good cap refill. Will continue to monitor.

## 2015-12-28 NOTE — Progress Notes (Signed)
CARDIAC REHAB PHASE I   PRE:  Rate/Rhythm: 91 SER  BP:  Supine: 140/79  Sitting:   Standing:    SaO2: 96% 3L  MODE:  Ambulation: 150 ft   POST:  Rate/Rhythm: 104 ST  BP:  Supine:   Sitting: 130/83  Standing:    SaO2: 99% 3L 1140-1236 Pt walked 150 ft on 3L with rolling walker with slow steady gait. Encouraged pursed- lip breathing and we took our time. No CP.  Pt glad to be up. To recliner after walk. Left [facility] booklet and answered a few questions but will continue ed on further visits. Wrote down how to view pre op video.  Sats stayed good on 3L.   Thomas Nuttingharlene Shynice Sigel, RN BSN  12/28/2015 12:34 PM

## 2015-12-28 NOTE — Progress Notes (Signed)
   12/28/15 2000  Clinical Encounter Type  Visited With Patient  Visit Type Other (Comment) (ADV DIRECTIVE)  Referral From Patient  Spiritual Encounters  Spiritual Needs Literature  Advance Directives (For Healthcare)  Does patient have an advance directive? No  Would patient like information on creating an advanced directive? Yes - Educational materials given  [facility] met with pt regarding ADV DIR and dropped off literature that pt will complete; please contact [place] on Monday once paperwork complete and [place] will coordinate notary; Note - this is for HCPOA . Gwynn Burly
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
## 2015-12-28 NOTE — Consult Note (Signed)
PULMONARY / CRITICAL CARE MEDICINE   Name: Thomas Thomas MRN: [id number] DOB: [birth year]    ADMISSION DATE:  12/23/2015 CONSULTATION DATE:  12/25/2015  REFERRING MD:  TRH  CHIEF COMPLAINT:  Acute heart failure  HISTORY OF PRESENT ILLNESS:   60 year old male with PMH as below, which includes Hypertension and Anxiety. He first started with general malaise about 1 week prior to admission. He developed upper respiratory symptoms (productive cough) and presented to PCP and was treated as viral URI with nasal decongestant and expectorant. Symptoms did not improve and he presented to [facility] ED 2/27. In ED he was tachycardiac and hypertensive, with adequate sats on room air. ECG showed some depression in ST leads V5/6 and elevation in aVR. CXR showed some retrocardiac opacification. A CT angiogram was obtained and confirmed the presence of pneumonia and ruled out pulmonary embolism and aortic dissection; it also demonstrated a small fusiform dilation of the infrarenal abdominal aorta measuring 2.4 cm.He was admitted for sepsis secondary to CAP. He was transferred to [facility] for cardiology evaluation. 3/1 he went to cath lab and found critical 3 vessel obstructive CAD 80% proximal LAD, 75% diagonal, 100% LCx with left to left collaterals, 100% mid RCA with left to right collaterals. During the case he developed severe frank pulmonary edema requiring intubation. PCCM consulted.   SUBJECTIVE: drinking coffee in bed watching fox news, neg 4.4 liters!  VITAL SIGNS: BP 121/72 mmHg  Pulse 85  Temp(Src) 97.7 F (36.5 C) (Oral)  Resp 23  Ht  (1.676 m)  Wt 86.4 kg (190 lb 7.6 oz)  BMI 30.76 kg/m2  SpO2 96%  HEMODYNAMICS: CVP:  [1 mmHg-6 mmHg] 1 mmHg  VENTILATOR SETTINGS:    INTAKE / OUTPUT: I/O last 3 completed shifts: In: 2260 [P.O.:240; I.V.:900; NG/GT:470; IV Piggyback:650] Out: 6420 [Urine:6420]  PHYSICAL EXAMINATION: General:  Extubated, no distress Neuro:  FC well HEENT Perrl ett,  beard Cardiovascular: RRR, no MRG Lungs:  Resolved crackles Abdomen:  Obese, soft, non-distended Musculoskeletal:  Mild edema resolved Skin:  Grossly intact  LABS:  BMET  Recent Labs Lab 12/24/15 0839 12/26/15 0620 12/27/15 0430  NA 138 139 138  K 4.3 3.7 3.3*  CL 105 105 99*  CO2 BUN 13 13 25*  CREATININE 1.27* 1.34* 1.34*  GLUCOSE 155* 116* 133*    Electrolytes  Recent Labs Lab 12/24/15 0839 12/26/15 0620 12/27/15 0430 12/28/15 0447  CALCIUM 7.8* 8.0* 8.5*  --   MG  --  1.5*  --  2.4  PHOS  --  2.8  --  4.7*    CBC  Recent Labs Lab 12/25/15 0320 12/26/15 0620 12/27/15 0430  WBC 20.0* 17.5* 15.2*  HGB 11.3* 12.7* 12.5*  HCT 36.3* 40.0 37.4*  PLT 159 164 160    Coag's  Recent Labs Lab 12/23/15 2026  INR 1.16    Sepsis Markers  Recent Labs Lab 12/23/15 1936 12/25/15 2027 12/26/15 0420 12/27/15 0430  LATICACIDVEN 2.46*  --   --   --   PROCALCITON  --  0.48 0.71 0.45    ABG  Recent Labs Lab 12/26/15 0810 12/26/15 1625 12/27/15 0348  PHART 7.451* 7.374 7.476*  PCO2ART 34.1* 47.2* 37.4  PO2ART 79.7* 97.0 90.7    Liver Enzymes  Recent Labs Lab 12/24/15 0839 12/27/15 0430  AST 72* 21  ALT 21 15*  ALKPHOS 50 46  BILITOT 0.9 0.5  ALBUMIN 2.9* 2.5*    Cardiac Enzymes  Recent Labs  Lab 12/24/15 0020 12/24/15 0839 12/24/15 1137  TROPONINI 7.08* 13.64* 12.41*    Glucose  Recent Labs Lab 12/25/15 2018 12/26/15 2042 12/27/15 0051 12/27/15 0604  GLUCAP 143* 126* 102* 116*    Imaging No results found.   STUDIES:  CT angio chest/abd/pelv 2/27 > No evidence of an aortic dissection. Small fusiform dilation of the infrarenal abdominal aorta measuring 2.4 cm. Consider follow-up with ultrasound in 1 year to reassess size. Left lower lobe consolidation consistent with pneumonia as noted on the current chest radiograph. No other acute findings within the chest, abdomen or pelvis.  CULTURES: BCx2 2/27  >>> Urine cx 2/27 ngtd  ANTIBIOTICS: Rocephin 2/27 >add stop date 7 days total Azithromycin 2/27 >3/3  SIGNIFICANT EVENTS: 2/27 admit 3/1 LHC, pulm edema, intubated 3/2- neg 2 liters 3/3 neg 2 liters again, extubated 3/4- neg 4.4 liters  LINES/TUBES: ETT 3/1 > IJ 3/1>>>consider dc 3/4  DISCUSSION: 60 year old male admitted for sepsis and ?ACS, underwent cath 3/1 and found severe 3 vessel disease. During cath developed respiratory distress and frank pulmonary edema. Intubated in cath lab, PCCM to assume care.  ASSESSMENT / PLAN:  PULMONARY A: Acute hypoxemic respiratory failure secondary to pulmonary edema CAP  P:   IS Did well with lasix, mobilize  CARDIOVASCULAR A:  3 Vessel CAD Acute heart failure H/o Hypertension  P:  Asa Lasix, see renal CABg next week likely ,his infection is very unimpressive  RENAL A:   AKI Slight rise crt hypoK S/p minimal contrast P:   Imag, k supp kvio Lasix Chem in am   GASTROINTESTINAL A:   No acute issues   P:   Protonix for SUP, dc if not hom em ed diet  HEMATOLOGIC A:   Anemia  P:  Sub q hep, dc when ambulating  INFECTIOUS A:   CAP, small LLL  P:   Ceftriaxone to stop date  ENDOCRINE A:   Controlled glu   P:   Follow glucose on chemistry  NEUROLOGIC A:   Acute encephalopathy secondary to hypoxemia/medical sedation  P:   Pt  Important to get mobile for CABG   FAMILY  - Updates: pt in room and girlfriends   - Inter-disciplinary family meet or Palliative Care meeting due by:  3/8  To triad, tele  Mcarthur Rossettianiel J. Tyson AliasFeinstein, MD, FACP Pgr: [phone] [facility] 12/28/2015 8:55 AM

## 2015-12-29 LAB — BASIC METABOLIC PANEL
Anion gap: 14 (ref 5–15)
BUN: 24 mg/dL — ABNORMAL HIGH (ref 6–20)
CALCIUM: 9.4 mg/dL (ref 8.9–10.3)
CO2: 26 mmol/L (ref 22–32)
CREATININE: 1.14 mg/dL (ref 0.61–1.24)
Chloride: 97 mmol/L — ABNORMAL LOW (ref 101–111)
GFR calc non Af Amer: >60 mL/min (ref >60)
GLUCOSE: 128 mg/dL — AB (ref 65–99)
Potassium: 4 mmol/L (ref 3.5–5.1)
Sodium: 137 mmol/L (ref 135–145)

## 2015-12-29 LAB — MAGNESIUM: MAGNESIUM: 2.5 mg/dL — AB (ref 1.7–2.4)

## 2015-12-29 LAB — PHOSPHORUS: Phosphorus: 4.5 mg/dL (ref 2.5–4.6)

## 2015-12-29 MED ORDER — METOPROLOL TARTRATE 25 MG PO TABS
25.0000 mg | ORAL_TABLET | Freq: Two times a day (BID) | ORAL | Status: DC
  Administered 2015-12-29 – 2016-01-01 (×8): 25 mg via ORAL
  Filled 2015-12-29 (×8): qty 1
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
## 2015-12-30 ENCOUNTER — Inpatient Hospital Stay (HOSPITAL_COMMUNITY): Payer: BLUE CROSS/BLUE SHIELD

## 2015-12-30 ENCOUNTER — Ambulatory Visit: Payer: BLUE CROSS/BLUE SHIELD | Admitting: Family

## 2015-12-30 DIAGNOSIS — A400 Sepsis due to streptococcus, group A: Secondary | ICD-10-CM

## 2015-12-30 DIAGNOSIS — R9431 Abnormal electrocardiogram [ECG] [EKG]: Secondary | ICD-10-CM

## 2015-12-30 LAB — CULTURE, RESPIRATORY W GRAM STAIN: Culture: NORMAL

## 2015-12-30 LAB — CULTURE, RESPIRATORY

## 2015-12-30 MED ORDER — DM-GUAIFENESIN ER 30-600 MG PO TB12
1.0000 | ORAL_TABLET | Freq: Two times a day (BID) | ORAL | Status: DC | PRN
  Administered 2015-12-30 – 2016-01-01 (×2): 1 via ORAL
  Filled 2015-12-30 (×2): qty 1

## 2015-12-30 MED ORDER — GUAIFENESIN-DM 100-10 MG/5ML PO SYRP
5.0000 mL | ORAL_SOLUTION | ORAL | Status: DC | PRN
  Administered 2015-12-30 – 2016-01-01 (×8): 5 mL via ORAL
  Filled 2015-12-30 (×8): qty 5

## 2015-12-30 NOTE — Progress Notes (Signed)
CARDIAC REHAB PHASE I   PRE:  Rate/Rhythm: 98 SR  BP:  Sitting: 133/84        SaO2: 96 RA  MODE:  Ambulation: 1380 ft   POST:  Rate/Rhythm: 92 SR  BP:  Sitting: 131/89         SaO2: 96 RA  Pt ambulated 1380 ft on RA, independent, steady gait, tolerated well with no complaints. Pre-op education completed with pt at bedside. Reviewed IS, sternal precautions, activity progression, cardiac surgery booklet, and cardiac surgery guidelines. Pt verbalized understanding. Pt states he has already viewed the cardiac surgery videos. Pt up ad lib in room. Pt able to ambulate independently at this point. Will follow post-op.   [id number] Joylene GrapesEmily C Janiel Derhammer, RN, BSN 12/30/2015 12:06 PM

## 2015-12-30 NOTE — Progress Notes (Signed)
Nutrition Follow-up  DOCUMENTATION CODES:   Obesity unspecified  INTERVENTION:  Encourage general healthful PO intake (Heart Healthy diet)   NUTRITION DIAGNOSIS:   Inadequate oral intake related to inability to eat as evidenced by NPO status.  Discontinued  GOAL:   Patient will meet greater than or equal to 90% of their needs  Being Met  MONITOR:   Vent status, Labs, I & O's, TF tolerance, Skin  REASON FOR ASSESSMENT:   Consult Enteral/tube feeding initiation and management  ASSESSMENT:   Thomas Thomas is an 60 y.o. male with hx of HTN, anxiety, prior tobacco abuse, presented to the ER with persistent back pain, coughs, and malaise. He was seen in the urgent care, and was given levoquin though he hadn't started as yet. 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  Patient was extubated on 3/3 and diet was advanced to Heart Healthy on 3/4. Pt states that his appetite is good and he is eating 100% of most meals. He states the food taste bland as he uses a lot of garlic and onion powder to season food at home. RD discussed the heart healthy diet and patient voiced understanding. States he knows how to eat healthfully and enjoys fruits and vegetables, but with his recent job he had gotten off track and was eating out a lot. He states he is very motivated to make healthful diet changes. He denies any additional questions, concerns, or diet education needs at this time.   Labs and medications reviewed.   Diet Order:  Diet Heart Room service appropriate?: Yes; Fluid consistency:: Thin  Skin:  Reviewed, no issues  Last BM:  3/5  Height:   Ht Readings from Last 1 Encounters:  12/24/15 5' 6" (1.676 m)    Weight:   Wt Readings from Last 1 Encounters:  12/30/15 189 lb 8.8 oz (85.98 kg)    Ideal Body Weight:  64.54 kg  BMI:  Body mass index is 30.61 kg/(m^2).  Estimated  Nutritional Needs:   Kcal:  1900-2100  Protein:  80-90 grams  Fluid:  2.1 L/day  EDUCATION NEEDS:   No education needs identified at this time  [place], LDN Inpatient Clinical Dietitian Pager: [phone] After Hours Pager: [phone]

## 2015-12-30 NOTE — Progress Notes (Signed)
VASCULAR LAB PRELIMINARY  PRELIMINARY  PRELIMINARY  PRELIMINARY  Pre-op Cardiac Surgery  Carotid Findings:  Bilateral:  1-39% ICA stenosis.  Vertebral artery flow is antegrade.      Upper Extremity Right Left  Brachial Pressures 134  Triphasic  145  Triphasic   Radial Waveforms Triphasic  Triphasic   Ulnar Waveforms Triphasic  Triphasic   Palmar Arch (Allen's Test) Normal with radial, obliterates with ulnar compression. Normal     Lower  Extremity Right Left  Dorsalis Pedis    Anterior Tibial Triphasic Triphasic   Posterior Tibial Triphasic  Triphasic   Ankle/Brachial Indices      Thomas Thomas, RVT 12/30/2015, 11:13 AM

## 2015-12-30 NOTE — Progress Notes (Signed)
Patient ID: Thomas CollinDennis Thomas, male   DOB: 10/10/1956, 60 y.o.   MRN: [id number]   Cardiologist: Anne FuSkains ([place])  Subjective:  60 year old male with pneumonia, lactic acidosis, non-ST elevation myocardial infarction, EKG with diffuse ST segment depression, elevation aVR concerning for global ischemia, triple-vessel coronary disease, which ended up being verified by cardiac catheterization on 12/26/15 demonstrating occluded right coronary artery, occluded circumflex artery and 80% mid LAD lesion. During the cardiac catheterization he developed flash pulmonary edema in the setting of global ischemia, left ventricular end-diastolic pressure was 45 mmHg, intubated, respiratory acidosis which has been responding to IV Lasix.  Wants to shower Cough with sputum  Objective:  Vital Signs in the last 24 hours: Temp:  [97.5 F (36.4 C)-98.4 F (36.9 C)] 98.3 F (36.8 C) (03/06 0527) Pulse Rate:  [76-106] 76 (03/06 0527) Resp:  [16-20] 16 (03/06 0527) BP: (129-136)/(64-72) 130/64 mmHg (03/06 0527) SpO2:  [94 %-100 %] 98 % (03/06 0527) Weight:  [85.98 kg (189 lb 8.8 oz)] 85.98 kg (189 lb 8.8 oz) (03/06 0527)  Intake/Output from previous day: 03/05 0701 - 03/06 0700 In: 490 [P.O.:490] Out: 2 [Urine:1; Stool:1]   Physical Exam: General: Moderately ill-appearing, moving extremities, on ventilator. Head:  Normocephalic and atraumatic. Right IJ catheter Lungs: Improved BS from acute respiratory failure, ventilator. Heart: Normal S1 and S2.  No murmur, rubs or gallops.  Abdomen: soft, non-tender, positive bowel sounds. Extremities: No clubbing or cyanosis. No edema. Normal radial pulses Neurologic: Alert and oriented x 3. Skin: dry palms Minor bruising right radial artery     Lab Results: No results for input(s): WBC, HGB, PLT in the last 72 hours.  Recent Labs  12/29/15 0246  NA 137  K 4.0  CL 97*  CO2 26  GLUCOSE 128*  BUN 24*  CREATININE 1.14    Imaging: No results found. Personally  viewed.   Telemetry: Sinus rhythm, no adverse arrhythmias Personally viewed.  12/30/2015   EKG:  Sinus tachycardia with diffuse ST segment depression, aVR elevation, suggestive of left main disease or triple-vessel disease Personally viewed.  Cardiac Studies:  Echocardiogram 12/24/15: - Left ventricle: The cavity size was normal. Wall thickness was increased in a pattern of mild LVH. Systolic function was normal. The estimated ejection fraction was in the range of 55% to 60% ( personal view possibly 45-50% ). Basal inferior and basal inferolateral hypokinesis. Features are consistent with a pseudonormal left ventricular filling pattern, with concomitant abnormal relaxation and increased filling pressure (grade 2 diastolic dysfunction). - Aortic valve: There was no stenosis. - Mitral valve: Mildly calcified annulus. There was no significant regurgitation. - Left atrium: The atrium was mildly dilated. - Right ventricle: The cavity size was normal. Systolic function was normal. - Pulmonary arteries: No complete TR doppler jet so unable to estimate PA systolic pressure. - Systemic veins: IVC measured 2.5 cm with < 50% respirophasic variation, suggesting RA pressure 15 mmHg.  Impressions:  - Normal LV size with mild LV hypertrophy.  Basal inferior and inferolateral hypokinesis. Moderate diastolic dysfunction. Normal RV size and systolic function. No significant valvular abnormalities.  Mid LAD calcification noted on CT angiogram.  Meds: Scheduled Meds: . antiseptic oral rinse  7 mL Mouth Rinse BID  . aspirin EC  81 mg Oral Daily  . atorvastatin  80 mg Oral q1800  . cefTRIAXone (ROCEPHIN)  IV  1 g Intravenous Q24H  . docusate sodium  100 mg Oral BID  . fentaNYL (SUBLIMAZE) injection  50 mcg Intravenous Once  .  furosemide  40 mg Oral Q breakfast  . heparin subcutaneous  5,000 Units Subcutaneous 3 times per day  . metoprolol tartrate  25 mg Oral BID  .  pantoprazole  40 mg Oral QHS  . sodium chloride flush  3 mL Intravenous Q12H  . sodium chloride flush  3 mL Intravenous Q12H  . sodium chloride flush  3 mL Intravenous Q12H   Continuous Infusions: . fentaNYL infusion INTRAVENOUS Stopped (12/27/15 0800)   PRN Meds:.sodium chloride, sodium chloride, Place/Maintain arterial line **AND** sodium chloride, acetaminophen **OR** acetaminophen, docusate, fentaNYL, magnesium hydroxide, ondansetron **OR** ondansetron (ZOFRAN) IV, sodium chloride flush, sodium chloride flush  Assessment/Plan:  Principal Problem:   Sepsis (HCC) Active Problems:   CAP (community acquired pneumonia)   ACS (acute coronary syndrome) (HCC)   Aneurysm of abdominal vessel (HCC)   NSTEMI (non-ST elevated myocardial infarction) (HCC)   Pulmonary edema   Encounter for nasogastric (NG) tube placement   CAD (coronary artery disease)   Abnormal EKG   60 year old male with acute respiratory failure, respiratory acidosis at the end of cardiac catheterization secondary to flash pulmonary edema on ventilator who was admitted with pneumonia, lactic acidosis, non-ST elevation myocardial infarction, EKG with diffuse ST segment depression, elevation aVR concerning for global ischemia, triple-vessel coronary disease.   Non-ST elevation myocardial infarction 3VD  For CABG with PVT ? Thursday  - EF 55-60 on echocardiogram, personally viewed likely 45% with inferior inferolateral hypokinesis. (RCA and Circ occluded)  Acute respiratory failure/respiratory acidosis -Secondary to pulmonary edema in the setting of global ischemia/stunning during cath with elevated left ventricular end-diastolic pressure, hydration overnight prior to cardiac catheterization for renal protection. -Appreciate critical care medicine. -acidosis has corrected. -extubated - PO lasix  -Watch creat. -stable  Former smoker -20 year history quit in 2011  Mild aortic dilatation -Small fusiform infrarenal  abdominal aorta dilatation measuring 2.4 cm.  Left lower lobe pneumonia -Azithromycin, Rocephin -Flu screen negative -Associated leukocytosis, 26>>>15 -Afebrile.  -Much improved. Stop date 12/31/15. - robitussin and mucinex for bronchitis ? Trauma with intubation    Mildly dilated fusiform abdominal aorta infrarenal -2.4 cm  PVT to finalize date for surgery    Charlton Haws 12/30/2015, 8:29 AM

## 2015-12-30 NOTE — Progress Notes (Signed)
PT Cancellation Note  Patient Details Name: Thomas Thomas MRN: [id number] DOB: 08/01/1956   Cancelled Treatment:    Reason Eval/Treat Not Completed: Fatigue/lethargy limiting ability to participate;Other (comment) (Nursing had just walked 600' and did squats today).  Nursing charted total 1380' with no AD.  Will try tomorrow to see if any PT needed.   Ivar DrapeStout, Valente Fosberg E 12/30/2015, 12:41 PM   Samul Dadauth Jaleal Schliep, PT MS Acute Rehab Dept. Number: [facility] [id number] and [facility] [phone]

## 2015-12-31 ENCOUNTER — Ambulatory Visit: Payer: BLUE CROSS/BLUE SHIELD | Admitting: Family Medicine

## 2015-12-31 NOTE — Progress Notes (Signed)
   12/31/15 0900  Clinical Encounter Type  Visited With Patient;Health care provider;Other (Comment) (Notary & witnesses)  Visit Type Follow-up  Referral From Patient  Spiritual Encounters  Spiritual Needs (ADV DIRECTIVE)  [facility] visited with pt and verified advanced directive and HCPOA; Notary and witnesses contacted to complete HCPOA this AM. 9:53 AM Erline LevineMichael I Libbi Towner

## 2015-12-31 NOTE — Progress Notes (Signed)
Patient ID: Jamal CollinDennis Lonon, male   DOB: 10/08/1956, 60 y.o.   MRN: [id number]   Cardiologist: Anne FuSkains ([place])  Subjective:  60 year old male with pneumonia, lactic acidosis, non-ST elevation myocardial infarction, EKG with diffuse ST segment depression, elevation aVR concerning for global ischemia, triple-vessel coronary disease, which ended up being verified by cardiac catheterization on 12/26/15 demonstrating occluded right coronary artery, occluded circumflex artery and 80% mid LAD lesion. During the cardiac catheterization he developed flash pulmonary edema in the setting of global ischemia, left ventricular end-diastolic pressure was 45 mmHg, intubated, respiratory acidosis which has been responding to IV Lasix.  Still worried about cough Walking in halls with no angina   Objective:  Vital Signs in the last 24 hours: Temp:  [98 F (36.7 C)-99 F (37.2 C)] 99 F (37.2 C) (03/07 0539) Pulse Rate:  [74-79] 74 (03/07 0539) Resp:  [15-18] 18 (03/07 0539) BP: (122-133)/(68-79) 122/72 mmHg (03/07 0539) SpO2:  [94 %-95 %] 94 % (03/07 0539) Weight:  [85.186 kg (187 lb 12.8 oz)] 85.186 kg (187 lb 12.8 oz) (03/07 0539)  Intake/Output from previous day: 03/06 0701 - 03/07 0700 In: 1140 [P.O.:1040; IV Piggyback:100] Out: 0    Physical Exam: General: Moderately ill-appearing, moving extremities, on ventilator. Head:  Normocephalic and atraumatic. Right IJ catheter Lungs: Improved BS from acute respiratory failure, ventilator. Heart: Normal S1 and S2.  No murmur, rubs or gallops.  Abdomen: soft, non-tender, positive bowel sounds. Extremities: No clubbing or cyanosis. No edema. Normal radial pulses Neurologic: Alert and oriented x 3. Skin: dry palms Minor bruising right radial artery     Lab Results: No results for input(s): WBC, HGB, PLT in the last 72 hours.  Recent Labs  12/29/15 0246  NA 137  K 4.0  CL 97*  CO2 26  GLUCOSE 128*  BUN 24*  CREATININE 1.14    Imaging: No results  found. Personally viewed.   Telemetry: Sinus rhythm, no adverse arrhythmias Personally viewed.  12/31/2015   EKG:  Sinus tachycardia with diffuse ST segment depression, aVR elevation, suggestive of left main disease or triple-vessel disease Personally viewed.  Cardiac Studies:  Echocardiogram 12/24/15: - Left ventricle: The cavity size was normal. Wall thickness was increased in a pattern of mild LVH. Systolic function was normal. The estimated ejection fraction was in the range of 55% to 60% ( personal view possibly 45-50% ). Basal inferior and basal inferolateral hypokinesis. Features are consistent with a pseudonormal left ventricular filling pattern, with concomitant abnormal relaxation and increased filling pressure (grade 2 diastolic dysfunction). - Aortic valve: There was no stenosis. - Mitral valve: Mildly calcified annulus. There was no significant regurgitation. - Left atrium: The atrium was mildly dilated. - Right ventricle: The cavity size was normal. Systolic function was normal. - Pulmonary arteries: No complete TR doppler jet so unable to estimate PA systolic pressure. - Systemic veins: IVC measured 2.5 cm with < 50% respirophasic variation, suggesting RA pressure 15 mmHg.  Impressions:  - Normal LV size with mild LV hypertrophy.  Basal inferior and inferolateral hypokinesis. Moderate diastolic dysfunction. Normal RV size and systolic function. No significant valvular abnormalities.  Mid LAD calcification noted on CT angiogram.  Meds: Scheduled Meds: . antiseptic oral rinse  7 mL Mouth Rinse BID  . aspirin EC  81 mg Oral Daily  . atorvastatin  80 mg Oral q1800  . cefTRIAXone (ROCEPHIN)  IV  1 g Intravenous Q24H  . docusate sodium  100 mg Oral BID  . fentaNYL (SUBLIMAZE) injection  50 mcg Intravenous Once  . furosemide  40 mg Oral Q breakfast  . heparin subcutaneous  5,000 Units Subcutaneous 3 times per day  . metoprolol tartrate  25  mg Oral BID  . pantoprazole  40 mg Oral QHS  . sodium chloride flush  3 mL Intravenous Q12H  . sodium chloride flush  3 mL Intravenous Q12H  . sodium chloride flush  3 mL Intravenous Q12H   Continuous Infusions: . fentaNYL infusion INTRAVENOUS Stopped (12/27/15 0800)   PRN Meds:.sodium chloride, sodium chloride, Place/Maintain arterial line **AND** sodium chloride, acetaminophen **OR** acetaminophen, dextromethorphan-guaiFENesin, docusate, fentaNYL, guaiFENesin-dextromethorphan, magnesium hydroxide, ondansetron **OR** ondansetron (ZOFRAN) IV, sodium chloride flush, sodium chloride flush  Assessment/Plan:  Principal Problem:   Sepsis (HCC) Active Problems:   CAP (community acquired pneumonia)   ACS (acute coronary syndrome) (HCC)   Aneurysm of abdominal vessel (HCC)   NSTEMI (non-ST elevated myocardial infarction) (HCC)   Pulmonary edema   Encounter for nasogastric (NG) tube placement   CAD (coronary artery disease)   Abnormal EKG   60 year old male with acute respiratory failure, respiratory acidosis at the end of cardiac catheterization secondary to flash pulmonary edema on ventilator who was admitted with pneumonia, lactic acidosis, non-ST elevation myocardial infarction, EKG with diffuse ST segment depression, elevation aVR concerning for global ischemia, triple-vessel coronary disease.   Non-ST elevation myocardial infarction 3VD  For CABG with PVT ? Thursday  - EF 55-60 on echocardiogram, personally viewed likely 45% with inferior inferolateral hypokinesis. (RCA and Circ occluded)  Acute respiratory failure/respiratory acidosis -Secondary to pulmonary edema in the setting of global ischemia/stunning during cath with elevated left ventricular end-diastolic pressure, hydration overnight prior to cardiac catheterization for renal protection. -Appreciate critical care medicine. -acidosis has corrected. -extubated - PO lasix  -Watch creat. -stable  Former smoker -20 year  history quit in 2011  Mild aortic dilatation -Small fusiform infrarenal abdominal aorta dilatation measuring 2.4 cm.  Left lower lobe pneumonia -Azithromycin, Rocephin finish course today  -Flu screen negative -Associated leukocytosis, 26>>>15 -Afebrile.  -Much improved. Stop date 12/31/15. - robitussin and mucinex for bronchitis ? Trauma with intubation  F/U CXR in am    Mildly dilated fusiform abdominal aorta infrarenal -2.4 cm  PVT to finalize date for surgery ? Thursday    Charlton Haws 12/31/2015, 10:27 AM

## 2015-12-31 NOTE — Progress Notes (Signed)
PT Cancellation Note  Patient Details Name: Thomas Thomas MRN: [id number] DOB: 07/23/1956   Cancelled Treatment:    Reason Eval/Treat Not Completed: Other (comment) (Pt just walked with nursing and is going to be eating breakf)ast next.  Will try later as pt can allow since he is walking with nursing for 1000+ feet at a time.   Thomas Thomas, Thomas Thomas 12/31/2015, 8:23 AM   Thomas Thomas, PT MS Acute Rehab Dept. Number: [facility] [id number] and [facility] [phone]

## 2015-12-31 NOTE — Progress Notes (Signed)
6 Days Post-Op Procedure(s) (LRB): Left Heart Cath and Coronary Angiography (N/A) Subjective: The patient has recovered from his pneumonia, sepsis, and flash pulmonary edema which developed during cardiac catheterization.he has minimal cough, no fever, he is on room air and ambulating well. Last chest x-ray shows significant clearing of his interstitial edema. White count is normal.  The patient has severe three-vessel coronary disease with moderate LV dysfunction and would benefit from CABG for improved survival and preservation of LV function. This is scheduled for Thursday, March 9. His pre-CABG Dopplers are satisfactory. I discussed the procedure with the patient and his family in detail.  Objective: Vital signs in last 24 hours: Temp:  [97.5 F (36.4 C)-99 F (37.2 C)] 97.5 F (36.4 C) (03/07 1222) Pulse Rate:  [70-75] 70 (03/07 1222) Cardiac Rhythm:  [-] Normal sinus rhythm (03/07 0800) Resp:  [15-18] 18 (03/07 1222) BP: (118-133)/(68-72) 118/70 mmHg (03/07 1222) SpO2:  [94 %-95 %] 95 % (03/07 1222) Weight:  [187 lb 12.8 oz (85.186 kg)] 187 lb 12.8 oz (85.186 kg) (03/07 0539)  Hemodynamic parameters for last 24 hours:  stable  Intake/Output from previous day: 03/06 0701 - 03/07 0700 In: 1140 [P.O.:1040; IV Piggyback:100] Out: 0  Intake/Output this shift: Total I/O In: 900 [P.O.:900] Out: -        Exam    General- alert and comfortable   Lungs- clear without rales, wheezes   Cor- regular rate and rhythm, no murmur , gallop   Abdomen- soft, non-tender   Extremities - warm, non-tender, minimal edema   Neuro- oriented, appropriate, no focal weakness   Lab Results: No results for input(s): WBC, HGB, HCT, PLT in the last 72 hours. BMET:  Recent Labs  12/29/15 0246  NA 137  K 4.0  CL 97*  CO2 26  GLUCOSE 128*  BUN 24*  CREATININE 1.14  CALCIUM 9.4    PT/INR: No results for input(s): LABPROT, INR in the last 72 hours. ABG    Component Value Date/Time   PHART 7.476* 12/27/2015 0348   HCO3 27.2* 12/27/2015 0348   TCO2 28.4 12/27/2015 0348   ACIDBASEDEF 0.2 12/26/2015 0810   O2SAT 70.3 12/28/2015 0430   CBG (last 3)  No results for input(s): GLUCAP in the last 72 hours.  Assessment/Plan: S/P Procedure(s) (LRB): Left Heart Cath and Coronary Angiography (N/A) CABG x4 March 9 AM   LOS: 8 days    Kathlee Nationseter Van Trigt III 12/31/2015

## 2016-01-01 ENCOUNTER — Inpatient Hospital Stay (HOSPITAL_COMMUNITY): Payer: BLUE CROSS/BLUE SHIELD

## 2016-01-01 LAB — SPIROMETRY WITH GRAPH
FEF 25-75 Post: 1.3 L/sec
FEF 25-75 Pre: 1.39 L/sec
FEF2575-%Change-Post: -6 %
FEF2575-%Pred-Post: 50 %
FEF2575-%Pred-Pre: 54 %
FEV1-%Change-Post: 1 %
FEV1-%Pred-Post: 71 %
FEV1-%Pred-Pre: 70 %
FEV1-Post: 2.21 L
FEV1-Pre: 2.18 L
FEV1FVC-%Change-Post: 6 %
FEV1FVC-%Pred-Pre: 91 %
FEV6-%Change-Post: -2 %
FEV6-%Pred-Post: 77 %
FEV6-%Pred-Pre: 79 %
FEV6-Post: 3 L
FEV6-Pre: 3.09 L
FEV6FVC-%Change-Post: 1 %
FEV6FVC-%Pred-Post: 103 %
FEV6FVC-%Pred-Pre: 102 %
FVC-%Change-Post: -4 %
FVC-%Pred-Post: 74 %
FVC-%Pred-Pre: 78 %
FVC-Post: 3.04 L
FVC-Pre: 3.18 L
Post FEV1/FVC ratio: 73 %
Post FEV6/FVC ratio: 99 %
Pre FEV1/FVC ratio: 68 %
Pre FEV6/FVC Ratio: 97 %

## 2016-01-01 LAB — PREPARE RBC (CROSSMATCH)

## 2016-01-01 LAB — ABO/RH: ABO/RH(D): A POS

## 2016-01-01 MED ORDER — VANCOMYCIN HCL 10 G IV SOLR
1500.0000 mg | INTRAVENOUS | Status: AC
  Administered 2016-01-02: 1500 mg via INTRAVENOUS
  Filled 2016-01-01: qty 1500

## 2016-01-01 MED ORDER — TEMAZEPAM 15 MG PO CAPS: 15.0000 mg | ORAL_CAPSULE | Freq: Once | ORAL | Status: DC | PRN

## 2016-01-01 MED ORDER — SODIUM CHLORIDE 0.9 % IV SOLN
INTRAVENOUS | Status: DC
  Filled 2016-01-01: qty 30

## 2016-01-01 MED ORDER — BISACODYL 5 MG PO TBEC
5.0000 mg | DELAYED_RELEASE_TABLET | Freq: Once | ORAL | Status: AC
  Administered 2016-01-01: 5 mg via ORAL
  Filled 2016-01-01: qty 1

## 2016-01-01 MED ORDER — SODIUM CHLORIDE 0.9 % IV SOLN
INTRAVENOUS | Status: AC
  Administered 2016-01-02: 69.8 mL/h via INTRAVENOUS
  Filled 2016-01-01: qty 40

## 2016-01-01 MED ORDER — DEXMEDETOMIDINE HCL IN NACL 400 MCG/100ML IV SOLN
0.1000 ug/kg/h | INTRAVENOUS | Status: AC
  Administered 2016-01-02: 14:00:00 via INTRAVENOUS
  Administered 2016-01-02: .3 ug/kg/h via INTRAVENOUS
  Filled 2016-01-01: qty 100

## 2016-01-01 MED ORDER — CHLORHEXIDINE GLUCONATE 4 % EX LIQD
60.0000 mL | Freq: Once | CUTANEOUS | Status: AC
  Administered 2016-01-02: 4 via TOPICAL

## 2016-01-01 MED ORDER — MAGNESIUM SULFATE 50 % IJ SOLN
40.0000 meq | INTRAMUSCULAR | Status: DC
  Filled 2016-01-01: qty 10

## 2016-01-01 MED ORDER — ALPRAZOLAM 0.25 MG PO TABS: 0.2500 mg | ORAL_TABLET | ORAL | Status: DC | PRN

## 2016-01-01 MED ORDER — SODIUM CHLORIDE 0.9 % IV SOLN
INTRAVENOUS | Status: AC
  Administered 2016-01-02: 1 [IU]/h via INTRAVENOUS
  Filled 2016-01-01: qty 2.5

## 2016-01-01 MED ORDER — POTASSIUM CHLORIDE 2 MEQ/ML IV SOLN
80.0000 meq | INTRAVENOUS | Status: DC
  Filled 2016-01-01: qty 40

## 2016-01-01 MED ORDER — DIAZEPAM 5 MG PO TABS
5.0000 mg | ORAL_TABLET | Freq: Once | ORAL | Status: AC
  Administered 2016-01-02: 5 mg via ORAL
  Filled 2016-01-01: qty 1

## 2016-01-01 MED ORDER — CHLORHEXIDINE GLUCONATE 4 % EX LIQD
60.0000 mL | Freq: Once | CUTANEOUS | Status: AC
  Administered 2016-01-01: 4 via TOPICAL
  Filled 2016-01-01: qty 60

## 2016-01-01 MED ORDER — METOPROLOL TARTRATE 12.5 MG HALF TABLET
12.5000 mg | ORAL_TABLET | Freq: Once | ORAL | Status: AC
  Administered 2016-01-02: 12.5 mg via ORAL
  Filled 2016-01-01: qty 1

## 2016-01-01 MED ORDER — EPINEPHRINE HCL 1 MG/ML IJ SOLN
0.0000 ug/min | INTRAVENOUS | Status: AC
  Administered 2016-01-02: 2 ug/min via INTRAVENOUS
  Filled 2016-01-01: qty 4

## 2016-01-01 MED ORDER — CEFUROXIME SODIUM 1.5 G IJ SOLR
1.5000 g | INTRAMUSCULAR | Status: AC
  Administered 2016-01-02: .75 g via INTRAVENOUS
  Administered 2016-01-02: 1.5 g via INTRAVENOUS
  Filled 2016-01-01: qty 1.5

## 2016-01-01 MED ORDER — PLASMA-LYTE 148 IV SOLN
INTRAVENOUS | Status: AC
  Administered 2016-01-02: 500 mL
  Filled 2016-01-01: qty 2.5

## 2016-01-01 MED ORDER — CHLORHEXIDINE GLUCONATE 0.12 % MT SOLN
15.0000 mL | Freq: Once | OROMUCOSAL | Status: AC
  Administered 2016-01-02: 15 mL via OROMUCOSAL
  Filled 2016-01-01: qty 15

## 2016-01-01 MED ORDER — DEXTROSE 5 % IV SOLN
750.0000 mg | INTRAVENOUS | Status: DC
  Filled 2016-01-01: qty 750

## 2016-01-01 MED ORDER — PHENYLEPHRINE HCL 10 MG/ML IJ SOLN
30.0000 ug/min | INTRAVENOUS | Status: AC
  Administered 2016-01-02: 20 ug/min via INTRAVENOUS
  Filled 2016-01-01: qty 2

## 2016-01-01 MED ORDER — NITROGLYCERIN IN D5W 200-5 MCG/ML-% IV SOLN
2.0000 ug/min | INTRAVENOUS | Status: AC
Start: 2016-01-02 — End: 2016-01-02
  Administered 2016-01-02: 5 ug/min via INTRAVENOUS
  Filled 2016-01-01: qty 250

## 2016-01-01 MED ORDER — DOPAMINE-DEXTROSE 3.2-5 MG/ML-% IV SOLN
0.0000 ug/kg/min | INTRAVENOUS | Status: DC
  Filled 2016-01-01 (×2): qty 250

## 2016-01-01 MED ORDER — ALBUTEROL SULFATE (2.5 MG/3ML) 0.083% IN NEBU
2.5000 mg | INHALATION_SOLUTION | Freq: Once | RESPIRATORY_TRACT | Status: AC
  Administered 2016-01-01: 2.5 mg via RESPIRATORY_TRACT

## 2016-01-01 NOTE — Progress Notes (Signed)
Patient ID: Thomas Thomas, male   DOB: 06/27/1956, 60 y.o.   MRN: [id number]   Cardiologist: Anne FuSkains ([place])  Subjective:  60 year old male with pneumonia, lactic acidosis, non-ST elevation myocardial infarction, EKG with diffuse ST segment depression, elevation aVR concerning for global ischemia, triple-vessel coronary disease, which ended up being verified by cardiac catheterization on 12/26/15 demonstrating occluded right coronary artery, occluded circumflex artery and 80% mid LAD lesion. During the cardiac catheterization he developed flash pulmonary edema in the setting of global ischemia, left ventricular end-diastolic pressure was 45 mmHg, intubated, respiratory acidosis which has been responding to IV Lasix.  Still worried about cough Walking in halls with no angina   Objective:  Vital Signs in the last 24 hours: Temp:  [97.5 F (36.4 C)-98.3 F (36.8 C)] 97.9 F (36.6 C) (03/08 0519) Pulse Rate:  [70-81] 76 (03/08 0519) Resp:  [18] 18 (03/08 0519) BP: (118-134)/(66-70) 134/67 mmHg (03/08 0519) SpO2:  [95 %-98 %] 96 % (03/08 0519) Weight:  [85.367 kg (188 lb 3.2 oz)] 85.367 kg (188 lb 3.2 oz) (03/08 0519)  Intake/Output from previous day: 03/07 0701 - 03/08 0700 In: 1140 [P.O.:1140] Out: 0    Physical Exam: General: Moderately ill-appearing, moving extremities, on ventilator. Head:  Normocephalic and atraumatic. Right IJ catheter Lungs: Improved BS from acute respiratory failure, ventilator. Heart: Normal S1 and S2.  No murmur, rubs or gallops.  Abdomen: soft, non-tender, positive bowel sounds. Extremities: No clubbing or cyanosis. No edema. Normal radial pulses Neurologic: Alert and oriented x 3. Skin: dry palms Minor bruising right radial artery     Lab Results: No results for input(s): WBC, HGB, PLT in the last 72 hours. No results for input(s): NA, K, CL, CO2, GLUCOSE, BUN, CREATININE in the last 72 hours.  Imaging: Dg Chest 2 View  01/01/2016  CLINICAL DATA:   Shortness of breath.  Preop for CABG. EXAM: CHEST  2 VIEW COMPARISON:  12/28/2015 FINDINGS: Linear densities at the left base, likely atelectasis, improved since prior study. Right lung is clear. No effusions. Heart is normal size. No acute bony abnormality. IMPRESSION: Minimal residual left base atelectasis. Electronically Signed   By: Charlett NoseKevin  Dover M.D.   On: 01/01/2016 08:02   Personally viewed.   Telemetry: Sinus rhythm, no adverse arrhythmias Personally viewed.  01/01/2016   EKG:  Sinus tachycardia with diffuse ST segment depression, aVR elevation, suggestive of left main disease or triple-vessel disease Personally viewed.  Cardiac Studies:  Echocardiogram 12/24/15: - Left ventricle: The cavity size was normal. Wall thickness was increased in a pattern of mild LVH. Systolic function was normal. The estimated ejection fraction was in the range of 55% to 60% ( personal view possibly 45-50% ). Basal inferior and basal inferolateral hypokinesis. Features are consistent with a pseudonormal left ventricular filling pattern, with concomitant abnormal relaxation and increased filling pressure (grade 2 diastolic dysfunction). - Aortic valve: There was no stenosis. - Mitral valve: Mildly calcified annulus. There was no significant regurgitation. - Left atrium: The atrium was mildly dilated. - Right ventricle: The cavity size was normal. Systolic function was normal. - Pulmonary arteries: No complete TR doppler jet so unable to estimate PA systolic pressure. - Systemic veins: IVC measured 2.5 cm with < 50% respirophasic variation, suggesting RA pressure 15 mmHg.  Impressions:  - Normal LV size with mild LV hypertrophy.  Basal inferior and inferolateral hypokinesis. Moderate diastolic dysfunction. Normal RV size and systolic function. No significant valvular abnormalities.  Mid LAD calcification noted on CT angiogram.  Meds: Scheduled Meds: . antiseptic oral  rinse  7 mL Mouth Rinse BID  . aspirin EC  81 mg Oral Daily  . atorvastatin  80 mg Oral q1800  . docusate sodium  100 mg Oral BID  . fentaNYL (SUBLIMAZE) injection  50 mcg Intravenous Once  . furosemide  40 mg Oral Q breakfast  . heparin subcutaneous  5,000 Units Subcutaneous 3 times per day  . metoprolol tartrate  25 mg Oral BID  . pantoprazole  40 mg Oral QHS  . sodium chloride flush  3 mL Intravenous Q12H  . sodium chloride flush  3 mL Intravenous Q12H  . sodium chloride flush  3 mL Intravenous Q12H   Continuous Infusions: . fentaNYL infusion INTRAVENOUS Stopped (12/27/15 0800)   PRN Meds:.sodium chloride, sodium chloride, Place/Maintain arterial line **AND** sodium chloride, acetaminophen **OR** acetaminophen, dextromethorphan-guaiFENesin, docusate, fentaNYL, guaiFENesin-dextromethorphan, magnesium hydroxide, ondansetron **OR** ondansetron (ZOFRAN) IV, sodium chloride flush, sodium chloride flush  Assessment/Plan:  Principal Problem:   Sepsis (HCC) Active Problems:   CAP (community acquired pneumonia)   ACS (acute coronary syndrome) (HCC)   Aneurysm of abdominal vessel (HCC)   NSTEMI (non-ST elevated myocardial infarction) (HCC)   Pulmonary edema   Encounter for nasogastric (NG) tube placement   CAD (coronary artery disease)   Abnormal EKG   60 year old male with acute respiratory failure, respiratory acidosis at the end of cardiac catheterization secondary to flash pulmonary edema on ventilator who was admitted with pneumonia, lactic acidosis, non-ST elevation myocardial infarction, EKG with diffuse ST segment depression, elevation aVR concerning for global ischemia, triple-vessel coronary disease.   Non-ST elevation myocardial infarction 3VD  For CABG with PVT  First case tomorrow   - EF 55-60 on echocardiogram, personally viewed likely 45% with inferior inferolateral hypokinesis. 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

## 2016-01-01 NOTE — Anesthesia Preprocedure Evaluation (Addendum)
Anesthesia Evaluation  Patient identified by MRN, date of birth, ID band Patient awake  General Assessment Comment: Per Dr. Eden EmmsNishan 01/01/16: "60 year old male with pneumonia, lactic acidosis, non-ST elevation myocardial infarction, EKG with diffuse ST segment depression, elevation aVR concerning for global ischemia, triple-vessel coronary disease, which ended up being verified by cardiac catheterization on 12/26/15 demonstrating occluded right coronary artery, occluded circumflex artery and 80% mid LAD lesion. During the cardiac catheterization he developed flash pulmonary edema in the setting of global ischemia, left ventricular end-diastolic pressure was 45 mmHg, intubated, respiratory acidosis which has been responding to IV Lasix.Still worried about cough Walking in halls with no angina"  Reviewed: Allergy & Precautions, NPO status , Patient's Chart, lab work & pertinent test results, reviewed documented beta blocker date and time   History of Anesthesia Complications Negative for: history of anesthetic complications  Airway Mallampati: III  TM Distance: >3 FB Neck ROM: Full    Dental  (+) Teeth Intact,    Pulmonary shortness of breath, neg sleep apnea, pneumonia, resolved, neg recent URI, former smoker, neg PE   Per note from Dr. Eden EmmsNishan: Resolved ARF. CXR this am reviewed, atelectasis pulmonary edema resolved, Cough better Finished Rx for pneumonia     breath sounds clear to auscultation       Cardiovascular Exercise Tolerance: Poor hypertension, Pt. on medications and Pt. on home beta blockers + CAD, + Past MI and +CHF   Rhythm:Regular   EF Per Dr. Eden EmmsNishan 45-50%  Small fusiform infrarenal abdominal aorta dilatation measuring 2.4 cm   Neuro/Psych Anxiety negative neurological ROS     GI/Hepatic GERD  Medicated and Controlled,(+) Cirrhosis       ,   Endo/Other    Renal/GU Renal InsufficiencyRenal disease      Musculoskeletal   Abdominal (+) + obese,   Peds  Hematology  (+) anemia ,   Anesthesia Other Findings   Reproductive/Obstetrics                        BP Readings from Last 3 Encounters:  01/01/16 129/80  12/16/15 151/83  07/29/15 156/86   Lab Results  Component Value Date   WBC 15.2* 12/27/2015   HGB 12.5* 12/27/2015   HCT 37.4* 12/27/2015   MCV 87.0 12/27/2015   PLT 160 12/27/2015     Chemistry      Component Value Date/Time   NA 137 12/29/2015 0246   NA 140 07/29/2015 0917   K 4.0 12/29/2015 0246   CL 97* 12/29/2015 0246   CO2 26 12/29/2015 0246   BUN 24* 12/29/2015 0246   BUN 11 07/29/2015 0917   CREATININE 1.14 12/29/2015 0246      Component Value Date/Time   CALCIUM 9.4 12/29/2015 0246   ALKPHOS 46 12/27/2015 0430   AST 21 12/27/2015 0430   ALT 15* 12/27/2015 0430   BILITOT 0.5 12/27/2015 0430   BILITOT 0.3 07/29/2015 0917     Lab Results  Component Value Date   HGBA1C 6.7* 12/24/2015   Lab Results  Component Value Date   INR 1.16 12/23/2015   EKG 12/23/15: Sinus tachycardia Borderline right axis deviation Repol abnrm suggests ischemia, diffuse leads global ST depression, ST elevation aVR Confirmed by Manus GunningANCOUR MD, STEPHEN  Cardiac catheterization 12/25/15:  Narrative:  Mid RCA lesion, 100% stenosed.  Prox Cx to Mid Cx lesion, 100% stenosed.  Prox LAD lesion, 80% stenosed.  Ost 1st Diag to 1st Diag lesion, 75% stenosed.  1. Critical 3  vessel obstructive CAD - 80% proximal LAD - 75% diagonal - 100% LCx with left to left collaterals. - 100% mid RCA with left to right collaterals.  2. Markedly elevated LV filling pressures. EDP 45 mm Hg. 3. Acute pulmonary edema with acute respiratory failure requiring  intubation.   Echo 12/24/15: Impressions: - Normal LV size with mild LV hypertrophy. EF 60-65%. Basal inferior and inferolateral hypokinesis. Moderate diastolic dysfunction. Normal RV size and systolic function. No  significant valvular abnormalities.  PFT 01/01/16: Interpretation: The FVC, FEV1, FEV1/FVC ratio and FEF25-75% are reduced indicating airway obstruction. The slow vital capacity is reduced. Following administration of bronchodilators, there is no significant response. Conclusions: Pulmonary Function Diagnosis: Moderate Obstructive Airways Disease -Peripheral Airway    Anesthesia Physical Anesthesia Plan  ASA: IV  Anesthesia Plan: General   Post-op Pain Management:    Induction: Intravenous  Airway Management Planned: Oral ETT  Additional Equipment: Arterial line, TEE, CVP and PA Cath  Intra-op Plan:   Post-operative Plan: Post-operative intubation/ventilation  Informed Consent: I have reviewed the patients History and Physical, chart, labs and discussed the procedure including the risks, benefits and alternatives for the proposed anesthesia with the patient or authorized representative who has indicated his/her understanding and acceptance.   Dental advisory given  Plan Discussed with: CRNA and Surgeon  Anesthesia Plan Comments:         Anesthesia Quick Evaluation

## 2016-01-02 ENCOUNTER — Inpatient Hospital Stay (HOSPITAL_COMMUNITY): Payer: BLUE CROSS/BLUE SHIELD | Admitting: Certified Registered Nurse Anesthetist

## 2016-01-02 ENCOUNTER — Encounter (HOSPITAL_COMMUNITY)
Admission: EM | Disposition: A | Discharge status: Home Health | Payer: Self-pay | Source: Home / Self Care | Attending: Cardiothoracic Surgery

## 2016-01-02 ENCOUNTER — Inpatient Hospital Stay (HOSPITAL_COMMUNITY): Payer: BLUE CROSS/BLUE SHIELD

## 2016-01-02 ENCOUNTER — Encounter (HOSPITAL_COMMUNITY): Payer: Self-pay | Admitting: Cardiothoracic Surgery

## 2016-01-02 DIAGNOSIS — I2510 Atherosclerotic heart disease of native coronary artery without angina pectoris: Secondary | ICD-10-CM

## 2016-01-02 DIAGNOSIS — Z951 Presence of aortocoronary bypass graft: Secondary | ICD-10-CM

## 2016-01-02 DIAGNOSIS — I214 Non-ST elevation (NSTEMI) myocardial infarction: Secondary | ICD-10-CM

## 2016-01-02 HISTORY — PX: TEE WITHOUT CARDIOVERSION: SHX5443

## 2016-01-02 HISTORY — PX: CORONARY ARTERY BYPASS GRAFT: SHX141

## 2016-01-02 LAB — POCT I-STAT, CHEM 8
BUN: 23 mg/dL — ABNORMAL HIGH (ref 6–20)
BUN: 20 mg/dL (ref 6–20)
BUN: 22 mg/dL — AB (ref 6–20)
BUN: 21 mg/dL — AB (ref 6–20)
BUN: 21 mg/dL — ABNORMAL HIGH (ref 6–20)
BUN: 19 mg/dL (ref 6–20)
BUN: 18 mg/dL (ref 6–20)
CALCIUM ION: 1 mmol/L — AB (ref 1.13–1.30)
CALCIUM ION: 0.98 mmol/L — AB (ref 1.13–1.30)
CALCIUM ION: 1.06 mmol/L — AB (ref 1.13–1.30)
CHLORIDE: 102 mmol/L (ref 101–111)
CHLORIDE: 103 mmol/L (ref 101–111)
CHLORIDE: 96 mmol/L — AB (ref 101–111)
CHLORIDE: 95 mmol/L — AB (ref 101–111)
CHLORIDE: 97 mmol/L — AB (ref 101–111)
CREATININE: 0.8 mg/dL (ref 0.61–1.24)
CREATININE: 0.9 mg/dL (ref 0.61–1.24)
Calcium, Ion: 1.13 mmol/L (ref 1.13–1.30)
Calcium, Ion: 1.15 mmol/L (ref 1.13–1.30)
Calcium, Ion: 1.03 mmol/L — ABNORMAL LOW (ref 1.13–1.30)
Calcium, Ion: 1.04 mmol/L — ABNORMAL LOW (ref 1.13–1.30)
Chloride: 95 mmol/L — ABNORMAL LOW (ref 101–111)
Chloride: 102 mmol/L (ref 101–111)
Creatinine, Ser: 0.9 mg/dL (ref 0.61–1.24)
Creatinine, Ser: 0.8 mg/dL (ref 0.61–1.24)
Creatinine, Ser: 0.9 mg/dL (ref 0.61–1.24)
Creatinine, Ser: 0.8 mg/dL (ref 0.61–1.24)
Creatinine, Ser: 0.9 mg/dL (ref 0.61–1.24)
GLUCOSE: 156 mg/dL — AB (ref 65–99)
GLUCOSE: 162 mg/dL — AB (ref 65–99)
Glucose, Bld: 133 mg/dL — ABNORMAL HIGH (ref 65–99)
Glucose, Bld: 111 mg/dL — ABNORMAL HIGH (ref 65–99)
Glucose, Bld: 123 mg/dL — ABNORMAL HIGH (ref 65–99)
Glucose, Bld: 140 mg/dL — ABNORMAL HIGH (ref 65–99)
Glucose, Bld: 159 mg/dL — ABNORMAL HIGH (ref 65–99)
HCT: 27 % — ABNORMAL LOW (ref 39.0–52.0)
HEMATOCRIT: 41 % (ref 39.0–52.0)
HEMATOCRIT: 28 % — AB (ref 39.0–52.0)
HEMATOCRIT: 37 % — AB (ref 39.0–52.0)
HEMATOCRIT: 28 % — AB (ref 39.0–52.0)
HEMATOCRIT: 29 % — AB (ref 39.0–52.0)
HEMATOCRIT: 28 % — AB (ref 39.0–52.0)
HEMOGLOBIN: 13.9 g/dL (ref 13.0–17.0)
HEMOGLOBIN: 9.5 g/dL — AB (ref 13.0–17.0)
HEMOGLOBIN: 9.5 g/dL — AB (ref 13.0–17.0)
Hemoglobin: 12.6 g/dL — ABNORMAL LOW (ref 13.0–17.0)
Hemoglobin: 9.5 g/dL — ABNORMAL LOW (ref 13.0–17.0)
Hemoglobin: 9.9 g/dL — ABNORMAL LOW (ref 13.0–17.0)
Hemoglobin: 9.2 g/dL — ABNORMAL LOW (ref 13.0–17.0)
POTASSIUM: 4.4 mmol/L (ref 3.5–5.1)
POTASSIUM: 4.4 mmol/L (ref 3.5–5.1)
POTASSIUM: 4.7 mmol/L (ref 3.5–5.1)
POTASSIUM: 5 mmol/L (ref 3.5–5.1)
POTASSIUM: 5.3 mmol/L — AB (ref 3.5–5.1)
POTASSIUM: 4.3 mmol/L (ref 3.5–5.1)
Potassium: 4.5 mmol/L (ref 3.5–5.1)
SODIUM: 136 mmol/L (ref 135–145)
SODIUM: 134 mmol/L — AB (ref 135–145)
SODIUM: 134 mmol/L — AB (ref 135–145)
SODIUM: 132 mmol/L — AB (ref 135–145)
SODIUM: 136 mmol/L (ref 135–145)
Sodium: 131 mmol/L — ABNORMAL LOW (ref 135–145)
Sodium: 135 mmol/L (ref 135–145)
TCO2: 28 mmol/L (ref 0–100)
TCO2: 26 mmol/L (ref 0–100)
TCO2: 28 mmol/L (ref 0–100)
TCO2: 27 mmol/L (ref 0–100)
TCO2: 27 mmol/L (ref 0–100)
TCO2: 24 mmol/L (ref 0–100)
TCO2: 23 mmol/L (ref 0–100)

## 2016-01-02 LAB — POCT I-STAT 3, ART BLOOD GAS (G3+)
ACID-BASE DEFICIT: 2 mmol/L (ref 0.0–2.0)
ACID-BASE DEFICIT: 1 mmol/L (ref 0.0–2.0)
Acid-base deficit: 2 mmol/L (ref 0.0–2.0)
Acid-base deficit: 2 mmol/L (ref 0.0–2.0)
BICARBONATE: 23.7 meq/L (ref 20.0–24.0)
BICARBONATE: 23.2 meq/L (ref 20.0–24.0)
Bicarbonate: 24.5 mEq/L — ABNORMAL HIGH (ref 20.0–24.0)
Bicarbonate: 24.5 mEq/L — ABNORMAL HIGH (ref 20.0–24.0)
Bicarbonate: 24.3 mEq/L — ABNORMAL HIGH (ref 20.0–24.0)
O2 SAT: 100 %
O2 SAT: 100 %
O2 SAT: 92 %
O2 SAT: 97 %
O2 SAT: 98 %
PCO2 ART: 38.2 mmHg (ref 35.0–45.0)
PCO2 ART: 41.1 mmHg (ref 35.0–45.0)
PCO2 ART: 41 mmHg (ref 35.0–45.0)
PCO2 ART: 41.5 mmHg (ref 35.0–45.0)
PH ART: 7.415 (ref 7.350–7.450)
PH ART: 7.369 (ref 7.350–7.450)
PH ART: 7.373 (ref 7.350–7.450)
PO2 ART: 250 mmHg — AB (ref 80.0–100.0)
PO2 ART: 88 mmHg (ref 80.0–100.0)
Patient temperature: 36.4
Patient temperature: 36.4
TCO2: 26 mmol/L (ref 0–100)
TCO2: 25 mmol/L (ref 0–100)
TCO2: 26 mmol/L (ref 0–100)
TCO2: 24 mmol/L (ref 0–100)
TCO2: 26 mmol/L (ref 0–100)
pCO2 arterial: 44.8 mmHg (ref 35.0–45.0)
pH, Arterial: 7.341 — ABNORMAL LOW (ref 7.350–7.450)
pH, Arterial: 7.357 (ref 7.350–7.450)
pO2, Arterial: 201 mmHg — ABNORMAL HIGH (ref 80.0–100.0)
pO2, Arterial: 63 mmHg — ABNORMAL LOW (ref 80.0–100.0)
pO2, Arterial: 103 mmHg — ABNORMAL HIGH (ref 80.0–100.0)

## 2016-01-02 LAB — GLUCOSE, CAPILLARY
GLUCOSE-CAPILLARY: 109 mg/dL — AB (ref 65–99)
GLUCOSE-CAPILLARY: 98 mg/dL (ref 65–99)
GLUCOSE-CAPILLARY: 152 mg/dL — AB (ref 65–99)
Glucose-Capillary: 114 mg/dL — ABNORMAL HIGH (ref 65–99)
Glucose-Capillary: 116 mg/dL — ABNORMAL HIGH (ref 65–99)
Glucose-Capillary: 161 mg/dL — ABNORMAL HIGH (ref 65–99)

## 2016-01-02 LAB — POCT I-STAT 4, (NA,K, GLUC, HGB,HCT)
GLUCOSE: 149 mg/dL — AB (ref 65–99)
HCT: 33 % — ABNORMAL LOW (ref 39.0–52.0)
Hemoglobin: 11.2 g/dL — ABNORMAL LOW (ref 13.0–17.0)
POTASSIUM: 4.1 mmol/L (ref 3.5–5.1)
Sodium: 138 mmol/L (ref 135–145)

## 2016-01-02 LAB — CBC
HCT: 41.3 % (ref 39.0–52.0)
HCT: 28 % — ABNORMAL LOW (ref 39.0–52.0)
HEMATOCRIT: 32.9 % — AB (ref 39.0–52.0)
HEMOGLOBIN: 10.3 g/dL — AB (ref 13.0–17.0)
Hemoglobin: 13.2 g/dL (ref 13.0–17.0)
Hemoglobin: 8.9 g/dL — ABNORMAL LOW (ref 13.0–17.0)
MCH: 28.2 pg (ref 26.0–34.0)
MCH: 27.3 pg (ref 26.0–34.0)
MCH: 27.9 pg (ref 26.0–34.0)
MCHC: 32 g/dL (ref 30.0–36.0)
MCHC: 31.3 g/dL (ref 30.0–36.0)
MCHC: 31.8 g/dL (ref 30.0–36.0)
MCV: 88.2 fL (ref 78.0–100.0)
MCV: 87.3 fL (ref 78.0–100.0)
MCV: 87.8 fL (ref 78.0–100.0)
Platelets: 274 10*3/uL (ref 150–400)
Platelets: 180 10*3/uL (ref 150–400)
Platelets: 166 10*3/uL (ref 150–400)
RBC: 4.68 MIL/uL (ref 4.22–5.81)
RBC: 3.77 MIL/uL — ABNORMAL LOW (ref 4.22–5.81)
RBC: 3.19 MIL/uL — ABNORMAL LOW (ref 4.22–5.81)
RDW: 15.1 % (ref 11.5–15.5)
RDW: 14.7 % (ref 11.5–15.5)
RDW: 14.7 % (ref 11.5–15.5)
WBC: 22.9 10*3/uL — ABNORMAL HIGH (ref 4.0–10.5)
WBC: 47.1 10*3/uL — AB (ref 4.0–10.5)
WBC: 31 10*3/uL — ABNORMAL HIGH (ref 4.0–10.5)

## 2016-01-02 LAB — APTT: APTT: 33 s (ref 24–37)

## 2016-01-02 LAB — MAGNESIUM: Magnesium: 3.2 mg/dL — ABNORMAL HIGH (ref 1.7–2.4)

## 2016-01-02 LAB — BASIC METABOLIC PANEL
Anion gap: 10 (ref 5–15)
BUN: 23 mg/dL — ABNORMAL HIGH (ref 6–20)
CO2: 25 mmol/L (ref 22–32)
Calcium: 8.9 mg/dL (ref 8.9–10.3)
Chloride: 101 mmol/L (ref 101–111)
Creatinine, Ser: 1.15 mg/dL (ref 0.61–1.24)
GFR calc Af Amer: >60 mL/min (ref >60)
GFR calc non Af Amer: >60 mL/min (ref >60)
Glucose, Bld: 107 mg/dL — ABNORMAL HIGH (ref 65–99)
Potassium: 4.3 mmol/L (ref 3.5–5.1)
Sodium: 136 mmol/L (ref 135–145)

## 2016-01-02 LAB — HEMOGLOBIN AND HEMATOCRIT, BLOOD
HCT: 27.6 % — ABNORMAL LOW (ref 39.0–52.0)
Hemoglobin: 9.2 g/dL — ABNORMAL LOW (ref 13.0–17.0)

## 2016-01-02 LAB — PROTIME-INR
INR: 1.48 (ref 0.00–1.49)
Prothrombin Time: 18 seconds — ABNORMAL HIGH (ref 11.6–15.2)

## 2016-01-02 LAB — CREATININE, SERUM
Creatinine, Ser: 1.04 mg/dL (ref 0.61–1.24)
GFR calc Af Amer: >60 mL/min (ref >60)
GFR calc non Af Amer: >60 mL/min (ref >60)

## 2016-01-02 LAB — SURGICAL PCR SCREEN
MRSA, PCR: NEGATIVE
Staphylococcus aureus: NEGATIVE

## 2016-01-02 LAB — PLATELET COUNT: Platelets: 203 10*3/uL (ref 150–400)

## 2016-01-02 SURGERY — CORONARY ARTERY BYPASS GRAFTING (CABG)
Anesthesia: General | Site: Chest

## 2016-01-02 MED ORDER — MAGNESIUM SULFATE 4 GM/100ML IV SOLN
4.0000 g | Freq: Once | INTRAVENOUS | Status: AC
  Administered 2016-01-02: 4 g via INTRAVENOUS

## 2016-01-02 MED ORDER — LACTATED RINGERS IV SOLN
INTRAVENOUS | Status: DC | PRN
  Administered 2016-01-02 (×2): via INTRAVENOUS

## 2016-01-02 MED ORDER — MORPHINE SULFATE (PF) 2 MG/ML IV SOLN
2.0000 mg | INTRAVENOUS | Status: DC | PRN
  Administered 2016-01-02: 4 mg via INTRAVENOUS
  Administered 2016-01-02: 2 mg via INTRAVENOUS
  Administered 2016-01-02 – 2016-01-03 (×5): 4 mg via INTRAVENOUS
  Administered 2016-01-04: 5 mg via INTRAVENOUS
  Filled 2016-01-02: qty 1
  Filled 2016-01-02 (×5): qty 2
  Filled 2016-01-02: qty 1
  Filled 2016-01-02 (×2): qty 2

## 2016-01-02 MED ORDER — LACTATED RINGERS IV SOLN
INTRAVENOUS | Status: DC | PRN
  Administered 2016-01-02: 08:00:00 via INTRAVENOUS

## 2016-01-02 MED ORDER — FENTANYL CITRATE (PF) 250 MCG/5ML IJ SOLN
INTRAMUSCULAR | Status: AC
  Filled 2016-01-02: qty 25

## 2016-01-02 MED ORDER — ROCURONIUM BROMIDE 100 MG/10ML IV SOLN
INTRAVENOUS | Status: DC | PRN
  Administered 2016-01-02: 80 mg via INTRAVENOUS
  Administered 2016-01-02: 20 mg via INTRAVENOUS

## 2016-01-02 MED ORDER — VANCOMYCIN HCL IN DEXTROSE 1-5 GM/200ML-% IV SOLN
1000.0000 mg | Freq: Once | INTRAVENOUS | Status: AC
  Administered 2016-01-02: 1000 mg via INTRAVENOUS
  Filled 2016-01-02: qty 200

## 2016-01-02 MED ORDER — MILRINONE IN DEXTROSE 20 MG/100ML IV SOLN
0.1250 ug/kg/min | INTRAVENOUS | Status: AC
  Administered 2016-01-02: 2.5 ug/kg/min via INTRAVENOUS
  Filled 2016-01-02: qty 100

## 2016-01-02 MED ORDER — FENTANYL CITRATE (PF) 250 MCG/5ML IJ SOLN
INTRAMUSCULAR | Status: AC
  Filled 2016-01-02: qty 5

## 2016-01-02 MED ORDER — PHENYLEPHRINE HCL 10 MG/ML IJ SOLN
INTRAMUSCULAR | Status: DC | PRN
  Administered 2016-01-02: 120 ug via INTRAVENOUS

## 2016-01-02 MED ORDER — VECURONIUM BROMIDE 10 MG IV SOLR
INTRAVENOUS | Status: DC | PRN
  Administered 2016-01-02: 5 mg via INTRAVENOUS
  Administered 2016-01-02: 2 mg via INTRAVENOUS
  Administered 2016-01-02 (×2): 5 mg via INTRAVENOUS

## 2016-01-02 MED ORDER — SODIUM CHLORIDE 0.9% FLUSH
3.0000 mL | Freq: Two times a day (BID) | INTRAVENOUS | Status: DC
Start: 2016-01-03 — End: 2016-01-09
  Administered 2016-01-03 – 2016-01-08 (×9): 3 mL via INTRAVENOUS

## 2016-01-02 MED ORDER — ASPIRIN 81 MG PO CHEW: 324.0000 mg | CHEWABLE_TABLET | Freq: Every day | ORAL | Status: DC

## 2016-01-02 MED ORDER — DEXTROSE 5 % IV SOLN
1.5000 g | Freq: Two times a day (BID) | INTRAVENOUS | Status: AC
  Administered 2016-01-02 – 2016-01-04 (×4): 1.5 g via INTRAVENOUS
  Filled 2016-01-02 (×4): qty 1.5

## 2016-01-02 MED ORDER — TRAMADOL HCL 50 MG PO TABS
50.0000 mg | ORAL_TABLET | ORAL | Status: DC | PRN
  Administered 2016-01-03 – 2016-01-08 (×3): 100 mg via ORAL
  Administered 2016-01-08 – 2016-01-09 (×2): 50 mg via ORAL
  Administered 2016-01-09: 100 mg via ORAL
  Filled 2016-01-02 (×3): qty 2
  Filled 2016-01-02 (×2): qty 1
  Filled 2016-01-02: qty 2

## 2016-01-02 MED ORDER — HEMOSTATIC AGENTS (NO CHARGE) OPTIME
TOPICAL | Status: DC | PRN
  Administered 2016-01-02 (×2): 1 via TOPICAL

## 2016-01-02 MED ORDER — SUCCINYLCHOLINE CHLORIDE 20 MG/ML IJ SOLN
INTRAMUSCULAR | Status: AC
  Filled 2016-01-02: qty 1

## 2016-01-02 MED ORDER — SODIUM CHLORIDE 0.45 % IV SOLN: INTRAVENOUS | Status: DC | PRN

## 2016-01-02 MED ORDER — SODIUM CHLORIDE 0.9% FLUSH: 3.0000 mL | INTRAVENOUS | Status: DC | PRN

## 2016-01-02 MED ORDER — EPHEDRINE SULFATE 50 MG/ML IJ SOLN
INTRAMUSCULAR | Status: DC | PRN
  Administered 2016-01-02: 10 mg via INTRAVENOUS

## 2016-01-02 MED ORDER — PHENYLEPHRINE 40 MCG/ML (10ML) SYRINGE FOR IV PUSH (FOR BLOOD PRESSURE SUPPORT)
PREFILLED_SYRINGE | INTRAVENOUS | Status: AC
  Filled 2016-01-02: qty 10

## 2016-01-02 MED ORDER — LACTATED RINGERS IV SOLN: 500.0000 mL | Freq: Once | INTRAVENOUS | Status: DC | PRN

## 2016-01-02 MED ORDER — NITROGLYCERIN 0.2 MG/ML ON CALL CATH LAB: INTRAVENOUS | Status: DC | PRN

## 2016-01-02 MED ORDER — MIDAZOLAM HCL 10 MG/2ML IJ SOLN
INTRAMUSCULAR | Status: AC
  Filled 2016-01-02: qty 2

## 2016-01-02 MED ORDER — BISACODYL 10 MG RE SUPP: 10.0000 mg | Freq: Every day | RECTAL | Status: DC

## 2016-01-02 MED ORDER — ONDANSETRON HCL 4 MG/2ML IJ SOLN: 4.0000 mg | Freq: Four times a day (QID) | INTRAMUSCULAR | Status: DC | PRN

## 2016-01-02 MED ORDER — METOPROLOL TARTRATE 12.5 MG HALF TABLET
12.5000 mg | ORAL_TABLET | Freq: Two times a day (BID) | ORAL | Status: DC
  Administered 2016-01-03 – 2016-01-04 (×4): 12.5 mg via ORAL
  Filled 2016-01-02 (×4): qty 1

## 2016-01-02 MED ORDER — EPHEDRINE SULFATE 50 MG/ML IJ SOLN
INTRAMUSCULAR | Status: AC
  Filled 2016-01-02: qty 1

## 2016-01-02 MED ORDER — PHENYLEPHRINE HCL 10 MG/ML IJ SOLN
0.0000 ug/min | INTRAVENOUS | Status: DC
  Filled 2016-01-02: qty 2

## 2016-01-02 MED ORDER — LEVALBUTEROL HCL 1.25 MG/0.5ML IN NEBU
1.2500 mg | INHALATION_SOLUTION | Freq: Four times a day (QID) | RESPIRATORY_TRACT | Status: DC
  Administered 2016-01-03 – 2016-01-07 (×17): 1.25 mg via RESPIRATORY_TRACT
  Filled 2016-01-02 (×17): qty 0.5

## 2016-01-02 MED ORDER — ACETAMINOPHEN 650 MG RE SUPP
650.0000 mg | Freq: Once | RECTAL | Status: AC
  Administered 2016-01-02: 650 mg via RECTAL

## 2016-01-02 MED ORDER — ARTIFICIAL TEARS OP OINT
TOPICAL_OINTMENT | OPHTHALMIC | Status: DC | PRN
  Administered 2016-01-02: 1 via OPHTHALMIC

## 2016-01-02 MED ORDER — ALBUMIN HUMAN 5 % IV SOLN
250.0000 mL | INTRAVENOUS | Status: AC | PRN
  Administered 2016-01-02 (×4): 250 mL via INTRAVENOUS
  Filled 2016-01-02 (×2): qty 250

## 2016-01-02 MED ORDER — ASPIRIN EC 325 MG PO TBEC
325.0000 mg | DELAYED_RELEASE_TABLET | Freq: Every day | ORAL | Status: DC
  Administered 2016-01-03 – 2016-01-05 (×3): 325 mg via ORAL
  Filled 2016-01-02 (×3): qty 1

## 2016-01-02 MED ORDER — 0.9 % SODIUM CHLORIDE (POUR BTL) OPTIME
TOPICAL | Status: DC | PRN
Start: 2016-01-02 — End: 2016-01-02
  Administered 2016-01-02: 5000 mL
  Administered 2016-01-02: 1000 mL

## 2016-01-02 MED ORDER — INSULIN ASPART 100 UNIT/ML ~~LOC~~ SOLN
0.0000 [IU] | SUBCUTANEOUS | Status: DC
  Administered 2016-01-02: 2 [IU] via SUBCUTANEOUS
  Administered 2016-01-03 (×2): 4 [IU] via SUBCUTANEOUS
  Administered 2016-01-03 – 2016-01-05 (×6): 2 [IU] via SUBCUTANEOUS

## 2016-01-02 MED ORDER — SODIUM CHLORIDE 0.9 % IV SOLN
INTRAVENOUS | Status: DC
  Administered 2016-01-02: 15:00:00 via INTRAVENOUS

## 2016-01-02 MED ORDER — SODIUM CHLORIDE 0.9 % IV SOLN
250.0000 mL | INTRAVENOUS | Status: DC
  Administered 2016-01-03: 250 mL via INTRAVENOUS

## 2016-01-02 MED ORDER — GLYCOPYRROLATE 0.2 MG/ML IJ SOLN
INTRAMUSCULAR | Status: AC
  Filled 2016-01-02: qty 1

## 2016-01-02 MED ORDER — PROPOFOL 10 MG/ML IV BOLUS
INTRAVENOUS | Status: AC
  Filled 2016-01-02: qty 20

## 2016-01-02 MED ORDER — LEVALBUTEROL HCL 1.25 MG/0.5ML IN NEBU
1.2500 mg | INHALATION_SOLUTION | Freq: Four times a day (QID) | RESPIRATORY_TRACT | Status: DC
  Administered 2016-01-02: 1.25 mg via RESPIRATORY_TRACT
  Filled 2016-01-02: qty 0.5

## 2016-01-02 MED ORDER — ALBUMIN HUMAN 5 % IV SOLN
INTRAVENOUS | Status: DC | PRN
  Administered 2016-01-02: 13:00:00 via INTRAVENOUS

## 2016-01-02 MED ORDER — FAMOTIDINE IN NACL 20-0.9 MG/50ML-% IV SOLN
20.0000 mg | Freq: Two times a day (BID) | INTRAVENOUS | Status: DC
  Administered 2016-01-02: 20 mg via INTRAVENOUS

## 2016-01-02 MED ORDER — INSULIN REGULAR BOLUS VIA INFUSION
0.0000 [IU] | Freq: Three times a day (TID) | INTRAVENOUS | Status: DC
  Filled 2016-01-02: qty 10

## 2016-01-02 MED ORDER — ACETAMINOPHEN 160 MG/5ML PO SOLN: 1000.0000 mg | Freq: Four times a day (QID) | ORAL | Status: AC

## 2016-01-02 MED ORDER — DOCUSATE SODIUM 100 MG PO CAPS
200.0000 mg | ORAL_CAPSULE | Freq: Every day | ORAL | Status: DC
  Administered 2016-01-03 – 2016-01-09 (×4): 200 mg via ORAL
  Filled 2016-01-02 (×4): qty 2

## 2016-01-02 MED ORDER — EPINEPHRINE HCL 1 MG/ML IJ SOLN
0.0000 ug/min | INTRAVENOUS | Status: DC
  Filled 2016-01-02: qty 4

## 2016-01-02 MED ORDER — SODIUM CHLORIDE 0.9 % IJ SOLN
OROMUCOSAL | Status: DC | PRN
  Administered 2016-01-02 (×3): 4 mL via TOPICAL

## 2016-01-02 MED ORDER — LACTATED RINGERS IV SOLN
INTRAVENOUS | Status: DC | PRN
  Administered 2016-01-02: 07:00:00 via INTRAVENOUS

## 2016-01-02 MED ORDER — POTASSIUM CHLORIDE 10 MEQ/50ML IV SOLN: 10.0000 meq | INTRAVENOUS | Status: AC

## 2016-01-02 MED ORDER — ANTISEPTIC ORAL RINSE SOLUTION (CORINZ)
7.0000 mL | Freq: Four times a day (QID) | OROMUCOSAL | Status: DC
  Administered 2016-01-03 – 2016-01-04 (×7): 7 mL via OROMUCOSAL

## 2016-01-02 MED ORDER — SODIUM CHLORIDE 0.9 % IV SOLN
INTRAVENOUS | Status: DC
  Filled 2016-01-02: qty 2.5

## 2016-01-02 MED ORDER — METOPROLOL TARTRATE 1 MG/ML IV SOLN
2.5000 mg | INTRAVENOUS | Status: DC | PRN
  Administered 2016-01-04: 5 mg via INTRAVENOUS
  Filled 2016-01-02: qty 5

## 2016-01-02 MED ORDER — ROCURONIUM BROMIDE 50 MG/5ML IV SOLN
INTRAVENOUS | Status: AC
  Filled 2016-01-02: qty 2

## 2016-01-02 MED ORDER — NITROGLYCERIN 0.2 MG/ML ON CALL CATH LAB
INTRAVENOUS | Status: DC | PRN
  Administered 2016-01-02: 80 ug via INTRAVENOUS
  Administered 2016-01-02: 120 ug via INTRAVENOUS

## 2016-01-02 MED ORDER — BISACODYL 5 MG PO TBEC
10.0000 mg | DELAYED_RELEASE_TABLET | Freq: Every day | ORAL | Status: DC
  Administered 2016-01-03 – 2016-01-05 (×3): 10 mg via ORAL
  Filled 2016-01-02 (×3): qty 2

## 2016-01-02 MED ORDER — ACETAMINOPHEN 500 MG PO TABS
1000.0000 mg | ORAL_TABLET | Freq: Four times a day (QID) | ORAL | Status: AC
  Administered 2016-01-03 – 2016-01-07 (×18): 1000 mg via ORAL
  Filled 2016-01-02 (×15): qty 2

## 2016-01-02 MED ORDER — LACTATED RINGERS IV SOLN
INTRAVENOUS | Status: DC
  Administered 2016-01-02: 15:00:00 via INTRAVENOUS

## 2016-01-02 MED ORDER — PANTOPRAZOLE SODIUM 40 MG PO TBEC
40.0000 mg | DELAYED_RELEASE_TABLET | Freq: Every day | ORAL | Status: DC
  Administered 2016-01-04 – 2016-01-09 (×6): 40 mg via ORAL
  Filled 2016-01-02 (×6): qty 1

## 2016-01-02 MED ORDER — LACTATED RINGERS IV SOLN: INTRAVENOUS | Status: DC

## 2016-01-02 MED ORDER — MORPHINE SULFATE (PF) 2 MG/ML IV SOLN: 1.0000 mg | INTRAVENOUS | Status: AC | PRN

## 2016-01-02 MED ORDER — ARTIFICIAL TEARS OP OINT
TOPICAL_OINTMENT | OPHTHALMIC | Status: AC
  Filled 2016-01-02: qty 3.5

## 2016-01-02 MED ORDER — ACETAMINOPHEN 160 MG/5ML PO SOLN: 650.0000 mg | Freq: Once | ORAL | Status: AC

## 2016-01-02 MED ORDER — METOPROLOL TARTRATE 25 MG/10 ML ORAL SUSPENSION
12.5000 mg | Freq: Two times a day (BID) | ORAL | Status: DC
Start: 2016-01-02 — End: 2016-01-05

## 2016-01-02 MED ORDER — OXYCODONE HCL 5 MG PO TABS
5.0000 mg | ORAL_TABLET | ORAL | Status: DC | PRN
  Administered 2016-01-03 – 2016-01-04 (×8): 10 mg via ORAL
  Filled 2016-01-02 (×8): qty 2

## 2016-01-02 MED ORDER — CHLORHEXIDINE GLUCONATE 0.12 % MT SOLN
15.0000 mL | OROMUCOSAL | Status: AC
  Administered 2016-01-02: 15 mL via OROMUCOSAL

## 2016-01-02 MED ORDER — HEPARIN SODIUM (PORCINE) 1000 UNIT/ML IJ SOLN
INTRAMUSCULAR | Status: AC
  Filled 2016-01-02: qty 1

## 2016-01-02 MED ORDER — DEXMEDETOMIDINE HCL IN NACL 200 MCG/50ML IV SOLN
0.0000 ug/kg/h | INTRAVENOUS | Status: DC
  Administered 2016-01-02: 0.5 ug/kg/h via INTRAVENOUS

## 2016-01-02 MED ORDER — VECURONIUM BROMIDE 10 MG IV SOLR
INTRAVENOUS | Status: AC
  Filled 2016-01-02: qty 10

## 2016-01-02 MED ORDER — MIDAZOLAM HCL 5 MG/5ML IJ SOLN
INTRAMUSCULAR | Status: DC | PRN
  Administered 2016-01-02: 2 mg via INTRAVENOUS
  Administered 2016-01-02: 1 mg via INTRAVENOUS
  Administered 2016-01-02: 2 mg via INTRAVENOUS
  Administered 2016-01-02 (×2): 1 mg via INTRAVENOUS
  Administered 2016-01-02: 3 mg via INTRAVENOUS

## 2016-01-02 MED ORDER — MIDAZOLAM HCL 2 MG/2ML IJ SOLN: 2.0000 mg | INTRAMUSCULAR | Status: DC | PRN

## 2016-01-02 MED ORDER — NITROGLYCERIN IN D5W 200-5 MCG/ML-% IV SOLN: 0.0000 ug/min | INTRAVENOUS | Status: DC

## 2016-01-02 MED ORDER — HEPARIN SODIUM (PORCINE) 1000 UNIT/ML IJ SOLN
INTRAMUSCULAR | Status: DC | PRN
  Administered 2016-01-02: 21000 [IU] via INTRAVENOUS
  Administered 2016-01-02: 3000 [IU] via INTRAVENOUS
  Administered 2016-01-02: 2000 [IU] via INTRAVENOUS

## 2016-01-02 MED ORDER — MILRINONE IN DEXTROSE 20 MG/100ML IV SOLN
0.3000 ug/kg/min | INTRAVENOUS | Status: DC
  Administered 2016-01-02: 0.25 ug/kg/min via INTRAVENOUS
  Filled 2016-01-02 (×3): qty 100

## 2016-01-02 MED ORDER — PROTAMINE SULFATE 10 MG/ML IV SOLN
INTRAVENOUS | Status: AC
  Filled 2016-01-02: qty 25

## 2016-01-02 MED ORDER — CHLORHEXIDINE GLUCONATE 0.12% ORAL RINSE (MEDLINE KIT)
15.0000 mL | Freq: Two times a day (BID) | OROMUCOSAL | Status: DC
  Administered 2016-01-02 – 2016-01-03 (×2): 15 mL via OROMUCOSAL

## 2016-01-02 MED ORDER — PROTAMINE SULFATE 10 MG/ML IV SOLN
INTRAVENOUS | Status: DC | PRN
  Administered 2016-01-02: 45 mg via INTRAVENOUS
  Administered 2016-01-02 (×2): 25 mg via INTRAVENOUS
  Administered 2016-01-02: 50 mg via INTRAVENOUS
  Administered 2016-01-02: 30 mg via INTRAVENOUS
  Administered 2016-01-02: 50 mg via INTRAVENOUS
  Administered 2016-01-02: 25 mg via INTRAVENOUS

## 2016-01-02 MED FILL — Potassium Chloride Inj 2 mEq/ML: INTRAVENOUS | Qty: 40 | Status: AC

## 2016-01-02 MED FILL — Magnesium Sulfate Inj 50%: INTRAMUSCULAR | Qty: 10 | Status: AC

## 2016-01-02 MED FILL — Mannitol IV Soln 20%: INTRAVENOUS | Qty: 500 | Status: AC

## 2016-01-02 MED FILL — Sodium Chloride IV Soln 0.9%: INTRAVENOUS | Qty: 1000 | Status: AC

## 2016-01-02 MED FILL — Sodium Bicarbonate IV Soln 8.4%: INTRAVENOUS | Qty: 50 | Status: AC

## 2016-01-02 MED FILL — Lidocaine HCl IV Inj 20 MG/ML: INTRAVENOUS | Qty: 5 | Status: AC

## 2016-01-02 MED FILL — Heparin Sodium (Porcine) Inj 1000 Unit/ML: INTRAMUSCULAR | Qty: 30 | Status: AC

## 2016-01-02 MED FILL — Heparin Sodium (Porcine) Inj 1000 Unit/ML: INTRAMUSCULAR | Qty: 10 | Status: AC

## 2016-01-02 MED FILL — Electrolyte-R (PH 7.4) Solution: INTRAVENOUS | Qty: 4000 | Status: AC

## 2016-01-02 SURGICAL SUPPLY — 80 items
ADAPTER CARDIO PERF ANTE/RETRO (ADAPTER) ×4 IMPLANT
BAG DECANTER FOR FLEXI CONT (MISCELLANEOUS) ×4 IMPLANT
BANDAGE ELASTIC 4 VELCRO ST LF (GAUZE/BANDAGES/DRESSINGS) ×4 IMPLANT
BANDAGE ELASTIC 6 VELCRO ST LF (GAUZE/BANDAGES/DRESSINGS) ×4 IMPLANT
BASKET HEART  (ORDER IN 25'S) (MISCELLANEOUS) ×1
BASKET HEART (ORDER IN 25'S) (MISCELLANEOUS) ×1
BASKET HEART (ORDER IN 25S) (MISCELLANEOUS) ×2 IMPLANT
BLADE STERNUM SYSTEM 6 (BLADE) ×4 IMPLANT
BLADE SURG 12 STRL SS (BLADE) ×4 IMPLANT
BNDG GAUZE ELAST 4 BULKY (GAUZE/BANDAGES/DRESSINGS) ×4 IMPLANT
CANISTER SUCTION 2500CC (MISCELLANEOUS) ×4 IMPLANT
CANNULA GUNDRY RCSP 15FR (MISCELLANEOUS) ×4 IMPLANT
CATH CPB KIT VANTRIGT (MISCELLANEOUS) ×4 IMPLANT
CATH ROBINSON RED A/P 18FR (CATHETERS) ×12 IMPLANT
CATH THORACIC 36FR RT ANG (CATHETERS) ×4 IMPLANT
CLIP FOGARTY SPRING 6M (CLIP) ×4 IMPLANT
CLIP RETRACTION 3.0MM CORONARY (MISCELLANEOUS) ×4 IMPLANT
CLIP TI WIDE RED SMALL 24 (CLIP) ×4 IMPLANT
COVER MAYO STAND STRL (DRAPES) ×4 IMPLANT
COVER SURGICAL LIGHT HANDLE (MISCELLANEOUS) ×4 IMPLANT
CRADLE DONUT ADULT HEAD (MISCELLANEOUS) ×4 IMPLANT
DERMABOND ADVANCED (GAUZE/BANDAGES/DRESSINGS) ×2
DERMABOND ADVANCED .7 DNX12 (GAUZE/BANDAGES/DRESSINGS) ×2 IMPLANT
DRAIN CHANNEL 32F RND 10.7 FF (WOUND CARE) ×4 IMPLANT
DRAPE CARDIOVASCULAR INCISE (DRAPES) ×2
DRAPE SLUSH/WARMER DISC (DRAPES) ×4 IMPLANT
DRAPE SRG 135X102X78XABS (DRAPES) ×2 IMPLANT
DRSG AQUACEL AG ADV 3.5X14 (GAUZE/BANDAGES/DRESSINGS) ×4 IMPLANT
ELECT BLADE 4.0 EZ CLEAN MEGAD (MISCELLANEOUS) ×4
ELECT BLADE 6.5 EXT (BLADE) ×4 IMPLANT
ELECT CAUTERY BLADE 6.4 (BLADE) ×4 IMPLANT
ELECT REM PT RETURN 9FT ADLT (ELECTROSURGICAL) ×8
ELECTRODE BLDE 4.0 EZ CLN MEGD (MISCELLANEOUS) ×2 IMPLANT
ELECTRODE REM PT RTRN 9FT ADLT (ELECTROSURGICAL) ×4 IMPLANT
FELT TEFLON 1X6 (MISCELLANEOUS) ×4 IMPLANT
GAUZE SPONGE 4X4 12PLY STRL (GAUZE/BANDAGES/DRESSINGS) ×8 IMPLANT
GLOVE BIO SURGEON STRL SZ7.5 (GLOVE) ×12 IMPLANT
GOWN STRL REUS W/ TWL LRG LVL3 (GOWN DISPOSABLE) ×8 IMPLANT
GOWN STRL REUS W/TWL LRG LVL3 (GOWN DISPOSABLE) ×8
HEMOSTAT POWDER SURGIFOAM 1G (HEMOSTASIS) ×12 IMPLANT
HEMOSTAT SURGICEL 2X14 (HEMOSTASIS) ×4 IMPLANT
KIT BASIN OR (CUSTOM PROCEDURE TRAY) ×4 IMPLANT
KIT ROOM TURNOVER OR (KITS) ×4 IMPLANT
KIT SUCTION CATH 14FR (SUCTIONS) ×4 IMPLANT
KIT VASOVIEW 6 PRO VH 2400 (KITS) ×4 IMPLANT
LEAD PACING MYOCARDI (MISCELLANEOUS) ×4 IMPLANT
MARKER GRAFT CORONARY BYPASS (MISCELLANEOUS) ×12 IMPLANT
NS IRRIG 1000ML POUR BTL (IV SOLUTION) ×20 IMPLANT
PACK OPEN HEART (CUSTOM PROCEDURE TRAY) ×4 IMPLANT
PAD ARMBOARD 7.5X6 YLW CONV (MISCELLANEOUS) ×8 IMPLANT
PAD ELECT DEFIB RADIOL ZOLL (MISCELLANEOUS) ×4 IMPLANT
PENCIL BUTTON HOLSTER BLD 10FT (ELECTRODE) ×4 IMPLANT
PUNCH AORTIC ROT 4.0MM RCL 40 (MISCELLANEOUS) ×4 IMPLANT
SET CARDIOPLEGIA MPS 5001102 (MISCELLANEOUS) ×4 IMPLANT
SPONGE GAUZE 4X4 12PLY STER LF (GAUZE/BANDAGES/DRESSINGS) ×4 IMPLANT
SPONGE LAP 18X18 X RAY DECT (DISPOSABLE) ×4 IMPLANT
SURGIFLO W/THROMBIN 8M KIT (HEMOSTASIS) ×4 IMPLANT
SUT BONE WAX W31G (SUTURE) ×8 IMPLANT
SUT PROLENE 4 0 RB 1 (SUTURE) ×6
SUT PROLENE 4 0 SH DA (SUTURE) ×4 IMPLANT
SUT PROLENE 4-0 RB1 .5 CRCL 36 (SUTURE) ×6 IMPLANT
SUT PROLENE 6 0 C 1 30 (SUTURE) ×12 IMPLANT
SUT PROLENE 6 0 CC (SUTURE) ×20 IMPLANT
SUT PROLENE 8 0 BV175 6 (SUTURE) ×12 IMPLANT
SUT PROLENE BLUE 7 0 (SUTURE) ×8 IMPLANT
SUT PROLENE POLY MONO (SUTURE) ×4 IMPLANT
SUT STEEL 6MS V (SUTURE) ×4 IMPLANT
SUT STEEL SZ 6 DBL 3X14 BALL (SUTURE) ×8 IMPLANT
SUT VIC AB 1 CTX 36 (SUTURE) ×6
SUT VIC AB 1 CTX36XBRD ANBCTR (SUTURE) ×6 IMPLANT
SUTURE E-PAK OPEN HEART (SUTURE) ×4 IMPLANT
SYSTEM SAHARA CHEST DRAIN ATS (WOUND CARE) ×4 IMPLANT
TAPE CLOTH SURG 4X10 WHT LF (GAUZE/BANDAGES/DRESSINGS) ×8 IMPLANT
TAPE PAPER 3X10 WHT MICROPORE (GAUZE/BANDAGES/DRESSINGS) ×4 IMPLANT
TOWEL OR 17X24 6PK STRL BLUE (TOWEL DISPOSABLE) ×8 IMPLANT
TOWEL OR 17X26 10 PK STRL BLUE (TOWEL DISPOSABLE) ×8 IMPLANT
TRAY FOLEY IC TEMP SENS 16FR (CATHETERS) ×4 IMPLANT
TUBING INSUFFLATION (TUBING) ×4 IMPLANT
UNDERPAD 30X30 INCONTINENT (UNDERPADS AND DIAPERS) ×4 IMPLANT
WATER STERILE IRR 1000ML POUR (IV SOLUTION) ×8 IMPLANT

## 2016-01-02 NOTE — OR Nursing (Signed)
2nd call to SICU  [id number]

## 2016-01-02 NOTE — Anesthesia Procedure Notes (Addendum)
Procedure Name: Intubation Date/Time: 01/02/2016 8:22 AM Performed by: Fabian NovemberSOLHEIM, JOSHUA SALOMAN Pre-anesthesia Checklist: Patient identified, Emergency Drugs available, Suction available, Patient being monitored and Timeout performed Patient Re-evaluated:Patient Re-evaluated prior to inductionOxygen Delivery Method: Circle system utilized Preoxygenation: Pre-oxygenation with 100% oxygen Intubation Type: IV induction Ventilation: Mask ventilation without difficulty Laryngoscope Size: Miller and 3 Grade View: Grade I Tube type: Oral Tube size: 8.0 mm Number of attempts: 1 Airway Equipment and Method: Stylet Placement Confirmation: ETT inserted through vocal cords under direct vision,  positive ETCO2 and breath sounds checked- equal and bilateral Secured at: 23 cm Tube secured with: Tape Dental Injury: Teeth and Oropharynx as per pre-operative assessment    Central Venous Catheter Insertion Performed by: anesthesiologist Patient location: Pre-op. Preanesthetic checklist: patient identified, IV checked, site marked, risks and benefits discussed, surgical consent, monitors and equipment checked, pre-op evaluation, timeout performed and anesthesia consent Position: Trendelenburg Lidocaine 1% used for infiltration Landmarks identified Catheter size: 9 Fr MAC introducer Procedure performed using ultrasound guided technique. Attempts: 1 Following insertion, line sutured and dressing applied. Post procedure assessment: blood return through all ports, free fluid flow and no air. Patient tolerated the procedure well with no immediate complications.    Central Venous Catheter Insertion Performed by: anesthesiologist Patient location: Pre-op. Preanesthetic checklist: patient identified, IV checked, site marked, risks and benefits discussed, surgical consent, monitors and equipment checked, pre-op evaluation, timeout performed and anesthesia consent Landmarks identified PA cath was  placed.Swan type and PA catheter depth:thermodilation and 70PA Cath depth:70 Procedure performed using ultrasound guided technique. Attempts: 1 Patient tolerated the procedure well with no immediate complications.

## 2016-01-02 NOTE — Anesthesia Postprocedure Evaluation (Signed)
Anesthesia Post Note  Patient: Thomas Thomas  Procedure(s) Performed: Procedure(s) (LRB): CORONARY ARTERY BYPASS GRAFTING (CABG)X 4 LIMA-LAD; SVG-RCA(ENDARDERECTOMY); SVG-DIAG; SVG-OM ENDOSCOPIC GREATER RIGHT SAPHENOUS VEIN HARVEST (N/A) TRANSESOPHAGEAL ECHOCARDIOGRAM (TEE) (N/A)  Patient location during evaluation: SICU Anesthesia Type: General Level of consciousness: sedated and patient remains intubated per anesthesia plan Pain management: pain level controlled Vital Signs Assessment: post-procedure vital signs reviewed and stable Respiratory status: patient remains intubated per anesthesia plan Anesthetic complications: no    Last Vitals:  Filed Vitals:   01/02/16 1445 01/02/16 1530  BP: 106/60   Pulse: 87   Temp:  36.4 C  Resp: 13     Last Pain:  Filed Vitals:   01/02/16 1531  PainSc: 0-No pain                 Thomas LoronJohn Gracelynn Thomas

## 2016-01-02 NOTE — Progress Notes (Signed)
EKG CRITICAL VALUE     12 lead EKG performed.  Critical value noted. Joni ReiningNicole, RN notified.   Oda Coganiara S Deagen Krass, CCT 01/02/2016 2:54PM

## 2016-01-02 NOTE — Progress Notes (Signed)
The patient was examined and preop studies reviewed. There has been no change from the prior exam and the patient is ready for surgery.  plan CABG on D Delton Seeelson

## 2016-01-02 NOTE — Op Note (Signed)
NAMECARLESTER, KASPAREK NO.:  [id number]  MEDICAL RECORD NO.:  [id number]  LOCATION:  2S04C                        FACILITY:  [facility]  PHYSICIAN:  Kerin Perna, M.D.  DATE OF BIRTH:  [birth year]  DATE OF PROCEDURE:  01/02/2016 DATE OF DISCHARGE:                              OPERATIVE REPORT   OPERATION: 1. Coronary artery bypass grafting x4 (left internal mammary artery to     LAD, saphenous vein graft to diagonal, saphenous vein graft to     obtuse marginal, saphenous vein graft to RCA-posterior descending). 2. Endarterectomy of right coronary artery-posterior descending. 3. Endoscopic harvest of right leg greater saphenous vein.  SURGEON:  Kerin Perna, M.D.  ASSISTANT:  Gershon Crane, PA-C.  ANESTHESIA:  General.  PREOPERATIVE DIAGNOSIS:  Acute non-ST elevation myocardial infarction, severe three-vessel coronary artery disease, moderate left ventricular dysfunction, preoperative pulmonary edema requiring mechanical ventilation.  POSTOPERATIVE DIAGNOSES:  Acute non-ST elevation myocardial infarction, severe three-vessel coronary artery disease, moderate left ventricular dysfunction, preoperative pulmonary edema requiring mechanical ventilation.  CLINICAL NOTE:  The patient is a 60 year old reformed smoker who presented to the hospital with respiratory insufficiency, pneumonia, and positive cardiac enzymes and positive sepsis markers.  Cardiac catheterization was performed, and he had severely elevated filling pressures and went into pulmonary edema and required intubation in the cath lab.  He had severe three-vessel coronary disease with chronic occlusion of the right coronary artery, chronic occlusion of the circumflex, and 95% stenosis of the LAD diagonal.  He was treated for sepsis, pneumonia, and pulmonary edema.  After his overall medical condition improved, and he was on room air, ambulatory without fever, off antibiotics, he was prepared for a  multivessel coronary bypass grafting for his ischemic cardiomyopathy and severe three-vessel coronary artery disease.  I examined the patient in his hospital room and reviewed the results of the cardiac catheterization with the patient and his family.  I discussed the details of CABG for treatment of his CAD including the use of general anesthesia and cardiopulmonary bypass, the expected postoperative recovery, location of the surgical incisions, and the potential risks.  I discussed the risks of stroke, bleeding, blood transfusion requirement, postoperative lung problems including pleural effusion, prolonged ventilator dependence, infection, and death. After reviewing these issues, he demonstrated his understanding and agreed to proceed with informed consent for this procedure which was projected to improve his survival, improve his quality of life, but would be at increased risk for a standard CABG procedure.  OPERATIVE PROCEDURE IN DETAIL:  The patient was brought to the operating room, placed supine on the operating table.  General anesthesia was induced under invasive hemodynamic monitoring.  He remained hemodynamically stable.  A transesophageal echo probe was placed which showed evidence of inferior and lateral hypokinesia from previous MI. There was no significant valvular disease.  The patient was prepped and draped as a sterile field.  A proper time-out was performed.  A sternal incision was made as the saphenous vein was harvested endoscopically from the right leg.  The left internal mammary artery was harvested as a pedicle graft from its origin at the subclavian vessels.  It was a 1.5- mm vessel with good  flow.  The sternal retractor was placed.  The pericardium was opened.  Pursestrings were placed in the ascending aorta and right atrium.  The aorta was thickened on the left lateral side. Heparin was administered, and the ACT was documented as being therapeutic.  The  patient was then cannulated and placed on cardiopulmonary bypass.  The coronaries were identified for grafting. The LAD, diagonal, posterior descending, and the OM were all heavily diseased and suboptimal targets.  The patient would not be a candidate for redo CABG.  Cardioplegia cannulas were placed for both antegrade and retrograde cold blood cardioplegia.  The patient was cooled to 32 degrees.  The aortic crossclamp was applied.  One liter of cold blood cardioplegia was delivered in split doses between the antegrade aortic and retrograde coronary sinus catheters.  There was good cardioplegic arrest, and septal temperature dropped less than 12 degrees.  Cardioplegia was delivered every 20 minutes.  The distal coronary anastomoses were performed.  The first distal anastomosis was the posterior descending branch of right coronary.  This was chronically occluded.  It was a 1.2-mm vessel. An arteriotomy was made.  There was still a heavily diseased and calcified plaque in the center of the vessel.  Because the vessel was so small and the plaque took up most of the lumen, an endarterectomy was required in order to accomplish an anastomosis.  A formal coronary endarterectomy was then performed on the plaque in the posterior descending.  The plaque was pulled proximally first and then divided at the proximal end of the arteriotomy.  The plaque was then carefully dissected and teased from the vessel wall going distally, and a plaque with a nice feathered end was then removed and sent for Pathology.  The artery was irrigated, and the reverse saphenous vein was then sewn end-to-side with a running 7-0 Prolene with good flow through the graft.  Cardioplegia was redosed.  The second distal anastomosis was to the circumflex marginal.  This was chronically occluded.  It was a 1.5-mm vessel with a heavily thickened wall.  There was a distal disease when a 1 mm probe was passed distally. A  reverse saphenous vein was sewn end-to-side with running 7-0 Prolene. There was adequate flow through the graft.  The third distal anastomosis was to the diagonal branch to LAD.  This was a 1.4-mm vessel.  It was small and had a proximal ostial 80% stenosis.  A reverse saphenous vein of the small caliber was sewn end-to- side with running 7-0 Prolene with good flow through the graft. Cardioplegia was redosed.  The fourth distal anastomosis was to the distal third of the LAD.  The LAD was also heavily calcified with a very thick wall.  The left IMA pedicle was brought through an opening, and the left lateral pericardium was brought down onto the LAD and sewn end-to-side with running 8-0 Prolene.  There was good flow through the anastomosis after briefly releasing the pedicle bulldog on the mammary artery.  The bulldog was reapplied, and the pedicle was secured to the epicardium.  Cardioplegia was redosed.  The crossclamp was still in place.  Two proximal vein anastomoses were performed on the ascending aorta.  The aorta was very thickened, and a third proximal anastomosis for the diagonal was deferred because of the poor quality of the aorta, and the diagonal proximal anastomosis was placed to the hood of the vein graft to the circumflex.  A 4.0 mm punch of running 6-0 Prolene were used to construct  the two proximal anastomoses of the right coronary and the circumflex vein grafts.  Air was vented from the left side of the heart with a dose of retrograde warm blood cardioplegia, and the final proximal anastomosis was tied, and the cross-clamp was removed. The heart resumed a spontaneous rhythm.  Using bulldog vascular clamps, the diagonal branch was then sewn end-to-side to the hood of the circumflex vein for its proximal anastomosis.  The bulldogs were removed, and there was good flow down each vein graft.  The proximal and distal anastomoses were checked and found to be hemostatic.   The patient was rewarmed and reperfused.  The cardioplegia cannula was removed.  Temporary pacing wires were applied.  The lungs were expanded.  The ventilator was resumed.  The patient was then weaned off cardiopulmonary bypass on low-dose Milrinone without difficulty. Echo showed good LV function and no mitral regurgitation.  Protamine was administered without adverse reaction.  The cannulas were removed.  The mediastinum was irrigated.  The superior pericardial fat was closed over the aorta.  Anterior mediastinal left pleural chest tubes were placed and brought out through separate incisions.  The sternum was closed with a wire.  The patient remained stable.  Cardiac output was 4.2-4.8 L/minute.  The pectoralis fascia was closed with running #1 Vicryl.  The subcutaneous and skin layers were closed with running Vicryl, and sterile dressings were applied.  Total cardiopulmonary bypass time was 164 minutes.     Kerin PernaPeter Van Trigt, M.D.     PV/MEDQ  D:  01/02/2016  T:  01/02/2016  Job:  [id number]  cc:   Noralyn PickPeter C. Eden EmmsNishan, MD, [facility] Group[facility]ACC

## 2016-01-02 NOTE — Progress Notes (Signed)
      [address]       [place] [id number]             [phone]       S/p CABG  Intubated, sedated  BP 93/60 mmHg  Pulse 82  Temp(Src) 97.7 F (36.5 C) (Oral)  Resp 0  Ht 5\' 6"  (1.676 m)  Wt 188 lb 14.4 oz (85.684 kg)  BMI 30.50 kg/m2  SpO2 100%  CI= 3.3 on milrinone at 0.2   Intake/Output Summary (Last 24 hours) at 01/02/16 1808 Last data filed at 01/02/16 1800  Gross per 24 hour  Intake 5046.81 ml  Output   4100 ml  Net 946.81 ml   K= 4.1, Hct= 33, WBC= 47.1K  Doing well early postop  Viviann SpareSteven C. Dorris FetchHendrickson, MD [facility] [phone]

## 2016-01-02 NOTE — Addendum Note (Signed)
Addendum  created 01/02/16 1723 by Lewie LoronJohn Jett Kulzer, MD   Modules edited: Anesthesia Blocks and Procedures, Clinical Notes   Clinical Notes:  File: [id number]

## 2016-01-02 NOTE — Progress Notes (Signed)
  Echocardiogram Echocardiogram Transesophageal has been performed.  Janalyn HarderWest, Priyansh Pry R 01/02/2016, 9:24 AM

## 2016-01-02 NOTE — OR Nursing (Signed)
1st call @1320  made to 2S (spoke with Tyler Aasoris), providing a patient update.   3rd call made @1417  made to 2S (spoke with Joni ReiningNicole), confirming plans for Room 4 postoperatively.

## 2016-01-02 NOTE — Procedures (Signed)
Extubation Procedure Note  Patient Details:   Name: Thomas Thomas DOB: 09/12/1956 MRN: [id number]   Airway Documentation: pt had audible cuff leak prior to extubation.  Alert, oriented, following all commands. NIF -30cmh20, VC 1.2 L/min.  Extubated to 4lpm Thomas Thomas sat 100%, strong, productive cough, stated name and talking.  Will continue to monitor.  IS instruction given.    Evaluation  O2 sats: stable throughout Complications: No apparent complications Patient did tolerate procedure well. Bilateral Breath Sounds: Clear Suctioning: Oral Yes  Richmond CampbellHall, Infant Doane Lynn 01/02/2016, 7:11 PM 2

## 2016-01-02 NOTE — Brief Op Note (Signed)
12/23/2015 - 01/02/2016  12:21 PM      [address]       [place] [id number]             [phone]     12/23/2015 - 01/02/2016  12:22 PM  PATIENT:  Thomas Thomas  60 y.o. male  PRE-OPERATIVE DIAGNOSIS:  CAD  POST-OPERATIVE DIAGNOSIS:  CAD  PROCEDURE:  Procedure(s): CORONARY ARTERY BYPASS GRAFTING (CABG)X 4 LIMA-LAD; SVG-RCA(ENDARDERECTOMY); SVG-DIAG; SVG-OM TRANSESOPHAGEAL ECHOCARDIOGRAM (TEE) ENDOSCOPIC GREATER SAPHENOUS VEIN HARVEST RIGHT  SURGEON:  Surgeon(s): Kerin PernaPeter Van Trigt, MD  PHYSICIAN ASSISTANT: Alexzavier Girardin PA-C  ANESTHESIA:   general  PATIENT CONDITION:  ICU - intubated and hemodynamically stable.  PRE-OPERATIVE WEIGHT: 85kg  COMPLICATIONS: NO KNOWN

## 2016-01-02 NOTE — Transfer of Care (Signed)
Immediate Anesthesia Transfer of Care Note  Patient: Thomas CollinDennis Thomas  Procedure(s) Performed: Procedure(s): CORONARY ARTERY BYPASS GRAFTING (CABG)X 4 LIMA-LAD; SVG-RCA(ENDARDERECTOMY); SVG-DIAG; SVG-OM ENDOSCOPIC GREATER RIGHT SAPHENOUS VEIN HARVEST (N/A) TRANSESOPHAGEAL ECHOCARDIOGRAM (TEE) (N/A)  Patient Location: SICU  Anesthesia Type:General  Level of Consciousness: sedated, unresponsive and Patient remains intubated per anesthesia plan  Airway & Oxygen Therapy: Patient remains intubated per anesthesia plan and Patient placed on Ventilator (see vital sign flow sheet for setting)  Post-op Assessment: Report given to RN and Post -op Vital signs reviewed and stable  Post vital signs: Reviewed and stable  Last Vitals:  Filed Vitals:   01/01/16 2000 01/02/16 0449  BP: 121/74 123/63  Pulse: 93 91  Temp: 36.7 C 37.1 C  Resp: 18 18    Complications: No apparent anesthesia complications

## 2016-01-03 ENCOUNTER — Inpatient Hospital Stay (HOSPITAL_COMMUNITY): Payer: BLUE CROSS/BLUE SHIELD

## 2016-01-03 ENCOUNTER — Encounter (HOSPITAL_COMMUNITY): Payer: Self-pay | Admitting: Cardiothoracic Surgery

## 2016-01-03 LAB — CBC
HCT: 29.8 % — ABNORMAL LOW (ref 39.0–52.0)
HEMATOCRIT: 32.6 % — AB (ref 39.0–52.0)
Hemoglobin: 9.7 g/dL — ABNORMAL LOW (ref 13.0–17.0)
Hemoglobin: 10.2 g/dL — ABNORMAL LOW (ref 13.0–17.0)
MCH: 28.6 pg (ref 26.0–34.0)
MCH: 27.9 pg (ref 26.0–34.0)
MCHC: 32.6 g/dL (ref 30.0–36.0)
MCHC: 31.3 g/dL (ref 30.0–36.0)
MCV: 87.9 fL (ref 78.0–100.0)
MCV: 89.3 fL (ref 78.0–100.0)
PLATELETS: 184 10*3/uL (ref 150–400)
PLATELETS: 206 10*3/uL (ref 150–400)
RBC: 3.39 MIL/uL — AB (ref 4.22–5.81)
RBC: 3.65 MIL/uL — ABNORMAL LOW (ref 4.22–5.81)
RDW: 15 % (ref 11.5–15.5)
RDW: 15.2 % (ref 11.5–15.5)
WBC: 27.7 10*3/uL — AB (ref 4.0–10.5)
WBC: 29 10*3/uL — AB (ref 4.0–10.5)

## 2016-01-03 LAB — GLUCOSE, CAPILLARY
GLUCOSE-CAPILLARY: 122 mg/dL — AB (ref 65–99)
GLUCOSE-CAPILLARY: 117 mg/dL — AB (ref 65–99)
GLUCOSE-CAPILLARY: 161 mg/dL — AB (ref 65–99)
GLUCOSE-CAPILLARY: 149 mg/dL — AB (ref 65–99)
Glucose-Capillary: 120 mg/dL — ABNORMAL HIGH (ref 65–99)

## 2016-01-03 LAB — CREATININE, SERUM
CREATININE: 1.09 mg/dL (ref 0.61–1.24)
GFR calc Af Amer: >60 mL/min (ref >60)
GFR calc non Af Amer: >60 mL/min (ref >60)

## 2016-01-03 LAB — BASIC METABOLIC PANEL
ANION GAP: 8 (ref 5–15)
BUN: 12 mg/dL (ref 6–20)
CHLORIDE: 104 mmol/L (ref 101–111)
CO2: 23 mmol/L (ref 22–32)
Calcium: 7.9 mg/dL — ABNORMAL LOW (ref 8.9–10.3)
Creatinine, Ser: 0.92 mg/dL (ref 0.61–1.24)
GFR calc non Af Amer: >60 mL/min (ref >60)
GLUCOSE: 150 mg/dL — AB (ref 65–99)
POTASSIUM: 4.4 mmol/L (ref 3.5–5.1)
Sodium: 135 mmol/L (ref 135–145)

## 2016-01-03 LAB — CARBOXYHEMOGLOBIN
Carboxyhemoglobin: 1.2 % (ref 0.5–1.5)
Methemoglobin: 0.8 % (ref 0.0–1.5)
O2 Saturation: 61.3 %
Total hemoglobin: 10.9 g/dL — ABNORMAL LOW (ref 13.5–18.0)

## 2016-01-03 LAB — POCT I-STAT 3, ART BLOOD GAS (G3+)
BICARBONATE: 25.4 meq/L — AB (ref 20.0–24.0)
O2 SAT: 94 %
PH ART: 7.378 (ref 7.350–7.450)
Patient temperature: 37.1
TCO2: 27 mmol/L (ref 0–100)
pCO2 arterial: 43.2 mmHg (ref 35.0–45.0)
pO2, Arterial: 72 mmHg — ABNORMAL LOW (ref 80.0–100.0)

## 2016-01-03 LAB — POCT I-STAT, CHEM 8
BUN: 15 mg/dL (ref 6–20)
CHLORIDE: 95 mmol/L — AB (ref 101–111)
Calcium, Ion: 1.16 mmol/L (ref 1.13–1.30)
Creatinine, Ser: 1 mg/dL (ref 0.61–1.24)
Glucose, Bld: 186 mg/dL — ABNORMAL HIGH (ref 65–99)
HCT: 35 % — ABNORMAL LOW (ref 39.0–52.0)
Hemoglobin: 11.9 g/dL — ABNORMAL LOW (ref 13.0–17.0)
POTASSIUM: 4.4 mmol/L (ref 3.5–5.1)
SODIUM: 135 mmol/L (ref 135–145)
TCO2: 24 mmol/L (ref 0–100)

## 2016-01-03 LAB — PREPARE FRESH FROZEN PLASMA
Unit division: 0
Unit division: 0

## 2016-01-03 LAB — MAGNESIUM
MAGNESIUM: 2.4 mg/dL (ref 1.7–2.4)
Magnesium: 2.8 mg/dL — ABNORMAL HIGH (ref 1.7–2.4)

## 2016-01-03 MED ORDER — POTASSIUM CHLORIDE CRYS ER 20 MEQ PO TBCR
20.0000 meq | EXTENDED_RELEASE_TABLET | Freq: Once | ORAL | Status: AC
  Administered 2016-01-03: 20 meq via ORAL
  Filled 2016-01-03: qty 1

## 2016-01-03 MED ORDER — FUROSEMIDE 10 MG/ML IJ SOLN
20.0000 mg | Freq: Two times a day (BID) | INTRAMUSCULAR | Status: AC
  Administered 2016-01-03: 20 mg via INTRAVENOUS
  Filled 2016-01-03: qty 2

## 2016-01-03 MED ORDER — INSULIN ASPART 100 UNIT/ML ~~LOC~~ SOLN: 0.0000 [IU] | SUBCUTANEOUS | Status: DC

## 2016-01-03 NOTE — Progress Notes (Addendum)
[facility] DAILY ICU PROGRESS NOTE                   [address]            [place] [address]          [phone]   1 Day Post-Op Procedure(s) (LRB): CORONARY ARTERY BYPASS GRAFTING (CABG)X 4 LIMA-LAD; SVG-RCA(ENDARDERECTOMY); SVG-DIAG; SVG-OM ENDOSCOPIC GREATER RIGHT SAPHENOUS VEIN HARVEST (N/A) TRANSESOPHAGEAL ECHOCARDIOGRAM (TEE) (N/A)  Total Length of Stay:  LOS: 11 days   Subjective: Patient wants to get out of bed.  Objective: Vital signs in last 24 hours: Temp:  [96.3 F (35.7 C)-99.5 F (37.5 C)] 99.5 F (37.5 C) (03/10 1200) Pulse Rate:  [79-102] 102 (03/10 1000) Cardiac Rhythm:  [-] Sinus tachycardia (03/10 1200) Resp:  [0-30] 9 (03/10 1200) BP: (92-142)/(55-73) 142/58 mmHg (03/10 1200) SpO2:  [92 %-100 %] 96 % (03/10 1200) Arterial Line BP: (84-185)/(45-64) 155/54 mmHg (03/10 0700) FiO2 (%):  [40 %-60 %] 40 % (03/09 1830) Weight:  [204 lb 5.9 oz (92.7 kg)] 204 lb 5.9 oz (92.7 kg) (03/10 0500)  Filed Weights   01/01/16 0519 01/02/16 0449 01/03/16 0500  Weight: 188 lb 3.2 oz (85.367 kg) 188 lb 14.4 oz (85.684 kg) 204 lb 5.9 oz (92.7 kg)    Weight change: 15 lb 7.5 oz (7.015 kg)   Hemodynamic parameters for last 24 hours: PAP: (14-62)/(5-31) 38/16 mmHg CO:  [4.9 L/min-7.8 L/min] 6.8 L/min CI:  [2.5 L/min/m2-4 L/min/m2] 3.5 L/min/m2  Intake/Output from previous day: 03/09 0701 - 03/10 0700 In: 5737 [I.V.:2962; Blood:1125; NG/GT:30; IV Piggyback:1620] Out: 5760 [Urine:3455; Emesis/NG output:150; Blood:1625; Chest Tube:530]  Intake/Output this shift: Total I/O In: 256.8 [I.V.:256.8] Out: 635 [Urine:395; Chest Tube:240]  Current Meds: Scheduled Meds: . acetaminophen  1,000 mg Oral 4 times per day   Or  . acetaminophen (TYLENOL) oral liquid 160 mg/5 mL  1,000 mg Per Tube 4 times per day  . antiseptic oral rinse  7 mL Mouth Rinse QID  . aspirin EC  325 mg Oral Daily   Or  . aspirin  324 mg Per Tube Daily  . atorvastatin  80 mg Oral q1800    . bisacodyl  10 mg Oral Daily   Or  . bisacodyl  10 mg Rectal Daily  . cefUROXime (ZINACEF)  IV  1.5 g Intravenous Q12H  . chlorhexidine gluconate  15 mL Mouth Rinse BID  . docusate sodium  200 mg Oral Daily  . insulin aspart  0-24 Units Subcutaneous 6 times per day  . levalbuterol  1.25 mg Nebulization Q6H  . metoprolol tartrate  12.5 mg Oral BID   Or  . metoprolol tartrate  12.5 mg Per Tube BID  . [START ON 01/04/2016] pantoprazole  40 mg Oral Daily  . sodium chloride flush  3 mL Intravenous Q12H   Continuous Infusions: . sodium chloride 10 mL/hr at 01/03/16 1200  . sodium chloride 250 mL (01/03/16 0600)  . sodium chloride 20 mL/hr at 01/02/16 1500  . dexmedetomidine Stopped (01/02/16 1645)  . EPINEPHrine 4 mg in dextrose 5% 250 mL infusion (16 mcg/mL) Stopped (01/02/16 1445)  . lactated ringers 20 mL/hr at 01/03/16 1200  . lactated ringers    . milrinone 0.249 mcg/kg/min (01/03/16 1200)  . nitroGLYCERIN Stopped (01/02/16 1445)  . phenylephrine (NEO-SYNEPHRINE) Adult infusion Stopped (01/02/16 1730)   PRN Meds:.sodium chloride, albumin human, metoprolol, morphine injection, ondansetron (ZOFRAN) IV, oxyCODONE, sodium chloride flush, traMADol  General appearance: alert, cooperative and  no distress Neurologic: intact Heart: RRR Lungs: Diminished at bases Abdomen: soft, non-tender; bowel sounds normal; no masses,  no organomegaly Extremities: Mild LE edema Wound: Dressing RLE is clean and dry. Aquacel intact  Lab Results: CBC: Recent Labs  01/02/16 2025 01/03/16 0330  WBC 31.0* 27.7*  HGB 8.9*  9.5* 9.7*  HCT 28.0*  28.0* 29.8*  PLT 166 184   BMET:  Recent Labs  01/02/16 0440  01/02/16 2025 01/03/16 0330  NA 136  < > 136 135  K 4.3  < > 4.5 4.4  CL 101  < > 102 104  CO2 25  --   --  23  GLUCOSE 107*  < > 159* 150*  BUN 23*  < > 18 12  CREATININE 1.15  < > 1.04  0.90 0.92  CALCIUM 8.9  --   --  7.9*  < > = values in this interval not displayed.  PT/INR:   Recent Labs  01/02/16 1447  LABPROT 18.0*  INR 1.48   Radiology: Dg Chest Port 1 View  01/03/2016  CLINICAL DATA:  Status post CABG EXAM: PORTABLE CHEST 1 VIEW COMPARISON:  01/02/2016 FINDINGS: Swan-Ganz central line again looped within the main pulmonary artery and tip extending into the proximal right main pulmonary artery. Left chest tube has been removed. Endotracheal tube and NG tube have been removed. No pneumothorax. Enlargement cardiac silhouette stable. Mild right lower lobe atelectasis. Retrocardiac opacity and small left effusion stable. IMPRESSION: Stable bibasilar opacities. Interval removal of numerous lines and tubes with Swan-Ganz central line as described. Electronically Signed   By: Esperanza Heiraymond  Rubner M.D.   On: 01/03/2016 07:34   Dg Chest Port 1 View  01/02/2016  CLINICAL DATA:  Status post CABG EXAM: PORTABLE CHEST 1 VIEW COMPARISON:  Chest x-ray dated 01/01/2016. FINDINGS: Interval surgical changes related to the given history of CABG. Sternotomy wires appear intact and stable in position. Swan-Ganz catheter in place with tip positioned in the midline. Endotracheal tube well positioned with tip above the level of the carina. Left-sided chest tube in place. Mediastinal drain in place. Enteric tube passes below the diaphragm. Vague opacity at the left lung base is likely atelectasis and/or small effusion. Subtle platelike opacity at the right lung base is also likely atelectasis. No pneumothorax seen. IMPRESSION: 1. Tubes and lines in place related to the interval CABG, all appearing appropriately positioned. 2. Vague opacity at the left lung base, likely a combination of associated atelectasis and/or small effusion. Possible additional atelectasis at the right lung base. Electronically Signed   By: Bary RichardStan  Maynard M.D.   On: 01/02/2016 15:11     Assessment/Plan: S/P Procedure(s) (LRB): CORONARY ARTERY BYPASS GRAFTING (CABG)X 4 LIMA-LAD; SVG-RCA(ENDARDERECTOMY); SVG-DIAG; SVG-OM  ENDOSCOPIC GREATER RIGHT SAPHENOUS VEIN HARVEST (N/A) TRANSESOPHAGEAL ECHOCARDIOGRAM (TEE) (N/A)  1.CV-SR in the 90's. On Lopressor 12.5 mg bid and Milrinone drip. Co ox 61%. Hope to gradually wean soon. Check Co ox in am. 2.Pulmonary-Chest tubes with 240 cc output last 24 hours. Will remove mediastinal and keep pleural for now.  On 1 liter of oxygen via [place]. CXR shows no pneumothorax, bibasilar atelectasis.Encourage incentive spirometer and flutter valve 3. Volume overload-will give Lasix this am 4. ABL anemia-H and H stable at 9.7 and 29.8 5. Remove Swan 6. GI-advance diet as tolerates 7. CBGs 122/120/117. PRE op HGA1C 6.7. He states he is "diet controlled". Had a discussion with him. May need to start oral hyperglycemic prior to discharge. CBGs 120 or less so will continue  with SS PRN for now. 8. Continue ICU management  ZIMMERMAN,DONIELLE M PA-C 01/03/2016 1:06 PM   preop admission for sepsis, now day 1 sp/cabg preop  CHF  Expected Acute  Blood - loss Anemia Co ox ok  I have seen and examined Thomas Thomas and agree with the above assessment  and plan.  Delight Ovens MD Beeper [phone] Office [phone] 01/03/2016 3:09 PM

## 2016-01-03 NOTE — Progress Notes (Signed)
Utilization Review Completed.  

## 2016-01-03 NOTE — Progress Notes (Signed)
Patient ID: Thomas Thomas, male   DOB: 04/22/1956, 60 y.o.   MRN: [id number] EVENING ROUNDS NOTE :     [address]       [place] [id number]             [phone]                 1 Day Post-Op Procedure(s) (LRB): CORONARY ARTERY BYPASS GRAFTING (CABG)X 4 LIMA-LAD; SVG-RCA(ENDARDERECTOMY); SVG-DIAG; SVG-OM ENDOSCOPIC GREATER RIGHT SAPHENOUS VEIN HARVEST (N/A) TRANSESOPHAGEAL ECHOCARDIOGRAM (TEE) (N/A)  Total Length of Stay:  LOS: 11 days  BP 144/74 mmHg  Pulse 97  Temp(Src) 98.2 F (36.8 C) (Oral)  Resp 24  Ht 5\' 6"  (1.676 m)  Wt 204 lb 5.9 oz (92.7 kg)  BMI 33.00 kg/m2  SpO2 94%  .Intake/Output      03/10 0701 - 03/11 0700   P.O. 720   I.V. (mL/kg) 502.6 (5.4)   Blood    NG/GT    IV Piggyback 50   Total Intake(mL/kg) 1272.6 (13.7)   Urine (mL/kg/hr) 665 (0.5)   Emesis/NG output    Blood    Chest Tube 370 (0.3)   Total Output 1035   Net +237.6         . sodium chloride Stopped (01/03/16 1900)  . sodium chloride Stopped (01/03/16 1900)  . sodium chloride Stopped (01/03/16 1900)  . dexmedetomidine Stopped (01/02/16 1645)  . EPINEPHrine 4 mg in dextrose 5% 250 mL infusion (16 mcg/mL) Stopped (01/02/16 1445)  . lactated ringers 20 mL/hr at 01/03/16 2000  . lactated ringers    . milrinone 0.249 mcg/kg/min (01/03/16 2000)  . nitroGLYCERIN Stopped (01/02/16 1445)  . phenylephrine (NEO-SYNEPHRINE) Adult infusion Stopped (01/02/16 1730)     Lab Results  Component Value Date   WBC 29.0* 01/03/2016   HGB 11.9* 01/03/2016   HCT 35.0* 01/03/2016   PLT 206 01/03/2016   GLUCOSE 186* 01/03/2016   CHOL 112 12/24/2015   TRIG 25 12/24/2015   HDL 30* 12/24/2015   LDLCALC 77 12/24/2015   ALT 15* 12/27/2015   AST 21 12/27/2015   NA 135 01/03/2016   K 4.4 01/03/2016   CL 95* 01/03/2016   CREATININE 1.00 01/03/2016   BUN 15 01/03/2016   CO2 23 01/03/2016   TSH 1.538 12/24/2015   PSA 1.5 11/02/2013   INR 1.48 01/02/2016   HGBA1C 6.7* 12/24/2015    Continue milrinone  Lasix given  Delight OvensEdward B Koah Chisenhall MD  Beeper [phone] Office [phone] 01/03/2016 8:25 PM

## 2016-01-04 ENCOUNTER — Inpatient Hospital Stay (HOSPITAL_COMMUNITY): Payer: BLUE CROSS/BLUE SHIELD

## 2016-01-04 LAB — GLUCOSE, CAPILLARY
GLUCOSE-CAPILLARY: 123 mg/dL — AB (ref 65–99)
GLUCOSE-CAPILLARY: 121 mg/dL — AB (ref 65–99)
GLUCOSE-CAPILLARY: 140 mg/dL — AB (ref 65–99)
Glucose-Capillary: 133 mg/dL — ABNORMAL HIGH (ref 65–99)
Glucose-Capillary: 111 mg/dL — ABNORMAL HIGH (ref 65–99)
Glucose-Capillary: 130 mg/dL — ABNORMAL HIGH (ref 65–99)

## 2016-01-04 LAB — BASIC METABOLIC PANEL
ANION GAP: 9 (ref 5–15)
BUN: 12 mg/dL (ref 6–20)
CALCIUM: 8.5 mg/dL — AB (ref 8.9–10.3)
CO2: 26 mmol/L (ref 22–32)
Chloride: 96 mmol/L — ABNORMAL LOW (ref 101–111)
Creatinine, Ser: 0.99 mg/dL (ref 0.61–1.24)
GFR calc Af Amer: >60 mL/min (ref >60)
GLUCOSE: 170 mg/dL — AB (ref 65–99)
POTASSIUM: 4.9 mmol/L (ref 3.5–5.1)
SODIUM: 131 mmol/L — AB (ref 135–145)

## 2016-01-04 LAB — CARBOXYHEMOGLOBIN
CARBOXYHEMOGLOBIN: 1.3 % (ref 0.5–1.5)
Methemoglobin: 0.9 % (ref 0.0–1.5)
O2 SAT: 55.6 %
TOTAL HEMOGLOBIN: 11.8 g/dL — AB (ref 13.5–18.0)

## 2016-01-04 LAB — CBC
HCT: 32.9 % — ABNORMAL LOW (ref 39.0–52.0)
Hemoglobin: 10.1 g/dL — ABNORMAL LOW (ref 13.0–17.0)
MCH: 27.1 pg (ref 26.0–34.0)
MCHC: 30.7 g/dL (ref 30.0–36.0)
MCV: 88.2 fL (ref 78.0–100.0)
Platelets: 208 10*3/uL (ref 150–400)
RBC: 3.73 MIL/uL — ABNORMAL LOW (ref 4.22–5.81)
RDW: 15.2 % (ref 11.5–15.5)
WBC: 37.9 10*3/uL — AB (ref 4.0–10.5)

## 2016-01-04 MED ORDER — FUROSEMIDE 10 MG/ML IJ SOLN
40.0000 mg | Freq: Once | INTRAMUSCULAR | Status: AC
  Administered 2016-01-04: 40 mg via INTRAVENOUS
  Filled 2016-01-04: qty 4

## 2016-01-04 NOTE — Progress Notes (Signed)
Patient ID: Thomas CollinDennis Doenges, male   DOB: 12/29/1955, 60 y.o.   MRN: [id number] EVENING ROUNDS NOTE :     [address]       [place] [id number]             [phone]                 2 Days Post-Op Procedure(s) (LRB): CORONARY ARTERY BYPASS GRAFTING (CABG)X 4 LIMA-LAD; SVG-RCA(ENDARDERECTOMY); SVG-DIAG; SVG-OM ENDOSCOPIC GREATER RIGHT SAPHENOUS VEIN HARVEST (N/A) TRANSESOPHAGEAL ECHOCARDIOGRAM (TEE) (N/A)  Total Length of Stay:  LOS: 12 days  BP 115/64 mmHg  Pulse 105  Temp(Src) 97.8 F (36.6 C) (Oral)  Resp 22  Ht 5\' 6"  (1.676 m)  Wt 204 lb 9.6 oz (92.806 kg)  BMI 33.04 kg/m2  SpO2 94%  .Intake/Output      03/10 0701 - 03/11 0700 03/11 0701 - 03/12 0700   P.O. 720    I.V. (mL/kg) 786.6 (8.5) 257.6 (2.8)   Blood     NG/GT     IV Piggyback 100    Total Intake(mL/kg) 1606.6 (17.3) 257.6 (2.8)   Urine (mL/kg/hr) 985 (0.4) 1425 (1.4)   Emesis/NG output     Blood     Chest Tube 440 (0.2)    Total Output 1425 1425   Net +181.6 -1167.4          . sodium chloride Stopped (01/03/16 1900)  . sodium chloride Stopped (01/03/16 1900)  . sodium chloride Stopped (01/03/16 1900)  . dexmedetomidine Stopped (01/02/16 1645)  . EPINEPHrine 4 mg in dextrose 5% 250 mL infusion (16 mcg/mL) Stopped (01/02/16 1445)  . lactated ringers 20 mL/hr at 01/04/16 1700  . lactated ringers    . milrinone 0.124 mcg/kg/min (01/04/16 1700)  . nitroGLYCERIN Stopped (01/02/16 1445)  . phenylephrine (NEO-SYNEPHRINE) Adult infusion Stopped (01/02/16 1730)     Lab Results  Component Value Date   WBC 37.9* 01/04/2016   HGB 10.1* 01/04/2016   HCT 32.9* 01/04/2016   PLT 208 01/04/2016   GLUCOSE 170* 01/04/2016   CHOL 112 12/24/2015   TRIG 25 12/24/2015   HDL 30* 12/24/2015   LDLCALC 77 12/24/2015   ALT 15* 12/27/2015   AST 21 12/27/2015   NA 131* 01/04/2016   K 4.9 01/04/2016   CL 96* 01/04/2016   CREATININE 0.99 01/04/2016   BUN 12 01/04/2016   CO2 26 01/04/2016   TSH 1.538  12/24/2015   PSA 1.5 11/02/2013   INR 1.48 01/02/2016   HGBA1C 6.7* 12/24/2015   Stable day Wean down milrinone   Delight OvensEdward B Suhail Peloquin MD  Beeper [phone] Office [phone] 01/04/2016 6:13 PM

## 2016-01-04 NOTE — Progress Notes (Signed)
Patient ID: Thomas Thomas, male   DOB: 07/31/1956, 60 y.o.   MRN: [id number] TCTS DAILY ICU PROGRESS NOTE                   [address]            [place] [id number]          [phone]   2 Days Post-Op Procedure(s) (LRB): CORONARY ARTERY BYPASS GRAFTING (CABG)X 4 LIMA-LAD; SVG-RCA(ENDARDERECTOMY); SVG-DIAG; SVG-OM ENDOSCOPIC GREATER RIGHT SAPHENOUS VEIN HARVEST (N/A) TRANSESOPHAGEAL ECHOCARDIOGRAM (TEE) (N/A)  Total Length of Stay:  LOS: 12 days   Subjective: Up to chair feels better today   Objective: Vital signs in last 24 hours: Temp:  [97.6 F (36.4 C)-100 F (37.8 C)] 97.6 F (36.4 C) (03/11 1228) Pulse Rate:  [44-115] 96 (03/11 1300) Cardiac Rhythm:  [-] Heart block (03/11 0800) Resp:  [9-38] 13 (03/11 1300) BP: (104-152)/(58-84) 104/58 mmHg (03/11 1300) SpO2:  [91 %-100 %] 95 % (03/11 1428) Weight:  [204 lb 9.6 oz (92.806 kg)] 204 lb 9.6 oz (92.806 kg) (03/11 0100)  Filed Weights   01/02/16 0449 01/03/16 0500 01/04/16 0100  Weight: 188 lb 14.4 oz (85.684 kg) 204 lb 5.9 oz (92.7 kg) 204 lb 9.6 oz (92.806 kg)    Weight change: 3.7 oz (0.106 kg)   Hemodynamic parameters for last 24 hours:    Intake/Output from previous day: 03/10 0701 - 03/11 0700 In: 1606.6 [P.O.:720; I.V.:786.6; IV Piggyback:100] Out: 1425 [Urine:985; Chest Tube:440]  Intake/Output this shift: Total I/O In: 158.4 [I.V.:158.4] Out: 1125 [Urine:1125]  Current Meds: Scheduled Meds: . acetaminophen  1,000 mg Oral 4 times per day   Or  . acetaminophen (TYLENOL) oral liquid 160 mg/5 mL  1,000 mg Per Tube 4 times per day  . antiseptic oral rinse  7 mL Mouth Rinse QID  . aspirin EC  325 mg Oral Daily   Or  . aspirin  324 mg Per Tube Daily  . atorvastatin  80 mg Oral q1800  . bisacodyl  10 mg Oral Daily   Or  . bisacodyl  10 mg Rectal Daily  . chlorhexidine gluconate  15 mL Mouth Rinse BID  . docusate sodium  200 mg Oral Daily  . insulin aspart  0-24 Units Subcutaneous 6  times per day  . levalbuterol  1.25 mg Nebulization Q6H  . metoprolol tartrate  12.5 mg Oral BID   Or  . metoprolol tartrate  12.5 mg Per Tube BID  . pantoprazole  40 mg Oral Daily  . sodium chloride flush  3 mL Intravenous Q12H   Continuous Infusions: . sodium chloride Stopped (01/03/16 1900)  . sodium chloride Stopped (01/03/16 1900)  . sodium chloride Stopped (01/03/16 1900)  . dexmedetomidine Stopped (01/02/16 1645)  . EPINEPHrine 4 mg in dextrose 5% 250 mL infusion (16 mcg/mL) Stopped (01/02/16 1445)  . lactated ringers 20 mL/hr at 01/04/16 1300  . lactated ringers    . milrinone 0.249 mcg/kg/min (01/04/16 1300)  . nitroGLYCERIN Stopped (01/02/16 1445)  . phenylephrine (NEO-SYNEPHRINE) Adult infusion Stopped (01/02/16 1730)   PRN Meds:.sodium chloride, metoprolol, morphine injection, ondansetron (ZOFRAN) IV, oxyCODONE, sodium chloride flush, traMADol  General appearance: alert, cooperative and no distress Neurologic: intact Heart: regular rate and rhythm, S1, S2 normal, no murmur, click, rub or gallop Lungs: diminished breath sounds bibasilar Abdomen: soft, non-tender; bowel sounds normal; no masses,  no organomegaly Extremities: extremities normal, atraumatic, no cyanosis or edema and Homans sign is negative, no sign  of DVT Wound: sternal wound intact  Lab Results: CBC: Recent Labs  01/03/16 1610 01/03/16 1612 01/04/16 0415  WBC 29.0*  --  37.9*  HGB 10.2* 11.9* 10.1*  HCT 32.6* 35.0* 32.9*  PLT 206  --  208   BMET:  Recent Labs  01/03/16 0330  01/03/16 1612 01/04/16 0415  NA 135  --  135 131*  K 4.4  --  4.4 4.9  CL 104  --  95* 96*  CO2 23  --   --  26  GLUCOSE 150*  --  186* 170*  BUN 12  --  15 12  CREATININE 0.92  < > 1.00 0.99  CALCIUM 7.9*  --   --  8.5*  < > = values in this interval not displayed.  PT/INR:  Recent Labs  01/02/16 1447  LABPROT 18.0*  INR 1.48   [place]ox 55 this am  Radiology: Dg Chest Port 1 View  01/04/2016  CLINICAL DATA:   Pneumothorax, shortness of breath EXAM: PORTABLE CHEST 1 VIEW COMPARISON:  01/03/2016 FINDINGS: Left chest tube.  No pneumothorax is seen. Interval removal of right IJ Swan-Ganz catheter. Right IJ venous sheath in place. Low lung volumes with patchy bilateral lower lobe opacities, likely atelectasis. Associated small bilateral pleural effusions. Mild cardiomegaly.  Postsurgical changes related to prior CABG. Median sternotomy. IMPRESSION: Left chest tube.  No pneumothorax is seen. Low lung volumes with patchy bilateral lower lobe opacities, likely atelectasis. Associated small bilateral pleural effusions. Electronically Signed   By: Charline Bills M.D.   On: 01/04/2016 09:36     Assessment/Plan: S/P Procedure(s) (LRB): CORONARY ARTERY BYPASS GRAFTING (CABG)X 4 LIMA-LAD; SVG-RCA(ENDARDERECTOMY); SVG-DIAG; SVG-OM ENDOSCOPIC GREATER RIGHT SAPHENOUS VEIN HARVEST (N/A) TRANSESOPHAGEAL ECHOCARDIOGRAM (TEE) (N/A) Mobilize Diuresis wean off milinone      Delight Ovens 01/04/2016 3:09 PM

## 2016-01-05 ENCOUNTER — Inpatient Hospital Stay (HOSPITAL_COMMUNITY): Payer: BLUE CROSS/BLUE SHIELD

## 2016-01-05 LAB — GLUCOSE, CAPILLARY
GLUCOSE-CAPILLARY: 116 mg/dL — AB (ref 65–99)
GLUCOSE-CAPILLARY: 122 mg/dL — AB (ref 65–99)
Glucose-Capillary: 107 mg/dL — ABNORMAL HIGH (ref 65–99)
Glucose-Capillary: 108 mg/dL — ABNORMAL HIGH (ref 65–99)
Glucose-Capillary: 114 mg/dL — ABNORMAL HIGH (ref 65–99)
Glucose-Capillary: 123 mg/dL — ABNORMAL HIGH (ref 65–99)

## 2016-01-05 LAB — BASIC METABOLIC PANEL
Anion gap: 11 (ref 5–15)
BUN: 13 mg/dL (ref 6–20)
CO2: 25 mmol/L (ref 22–32)
Calcium: 8.6 mg/dL — ABNORMAL LOW (ref 8.9–10.3)
Chloride: 95 mmol/L — ABNORMAL LOW (ref 101–111)
Creatinine, Ser: 0.98 mg/dL (ref 0.61–1.24)
GFR calc Af Amer: >60 mL/min (ref >60)
GFR calc non Af Amer: >60 mL/min (ref >60)
Glucose, Bld: 127 mg/dL — ABNORMAL HIGH (ref 65–99)
Potassium: 4.6 mmol/L (ref 3.5–5.1)
Sodium: 131 mmol/L — ABNORMAL LOW (ref 135–145)

## 2016-01-05 LAB — TYPE AND SCREEN
ABO/RH(D): A POS
Antibody Screen: NEGATIVE
Unit division: 0
Unit division: 0

## 2016-01-05 LAB — CARBOXYHEMOGLOBIN
Carboxyhemoglobin: 1.1 % (ref 0.5–1.5)
Methemoglobin: 0.8 % (ref 0.0–1.5)
O2 Saturation: 52 %
Total hemoglobin: 9.8 g/dL — ABNORMAL LOW (ref 13.5–18.0)

## 2016-01-05 LAB — CBC
HCT: 30.5 % — ABNORMAL LOW (ref 39.0–52.0)
Hemoglobin: 9.6 g/dL — ABNORMAL LOW (ref 13.0–17.0)
MCH: 27.4 pg (ref 26.0–34.0)
MCHC: 31.5 g/dL (ref 30.0–36.0)
MCV: 87.1 fL (ref 78.0–100.0)
Platelets: 208 10*3/uL (ref 150–400)
RBC: 3.5 MIL/uL — ABNORMAL LOW (ref 4.22–5.81)
RDW: 15.2 % (ref 11.5–15.5)
WBC: 32.4 10*3/uL — ABNORMAL HIGH (ref 4.0–10.5)

## 2016-01-05 MED ORDER — ALBUMIN HUMAN 5 % IV SOLN
INTRAVENOUS | Status: AC
  Administered 2016-01-05: 12.5 g via INTRAVENOUS
  Filled 2016-01-05: qty 250

## 2016-01-05 MED ORDER — LISINOPRIL 2.5 MG PO TABS
2.5000 mg | ORAL_TABLET | Freq: Every day | ORAL | Status: DC
  Administered 2016-01-05 – 2016-01-09 (×4): 2.5 mg via ORAL
  Filled 2016-01-05 (×6): qty 1

## 2016-01-05 MED ORDER — CARVEDILOL 3.125 MG PO TABS
3.1250 mg | ORAL_TABLET | Freq: Two times a day (BID) | ORAL | Status: DC
  Administered 2016-01-06 – 2016-01-07 (×4): 3.125 mg via ORAL
  Filled 2016-01-05 (×5): qty 1

## 2016-01-05 MED ORDER — CARVEDILOL 6.25 MG PO TABS
6.2500 mg | ORAL_TABLET | Freq: Two times a day (BID) | ORAL | Status: DC
  Administered 2016-01-05 (×2): 6.25 mg via ORAL
  Filled 2016-01-05 (×2): qty 1

## 2016-01-05 MED ORDER — ALBUMIN HUMAN 5 % IV SOLN
12.5000 g | Freq: Once | INTRAVENOUS | Status: AC
  Administered 2016-01-05: 12.5 g via INTRAVENOUS

## 2016-01-05 MED ORDER — FUROSEMIDE 20 MG PO TABS
20.0000 mg | ORAL_TABLET | Freq: Every day | ORAL | Status: DC
  Administered 2016-01-05: 20 mg via ORAL
  Filled 2016-01-05: qty 1

## 2016-01-05 NOTE — Progress Notes (Signed)
Patient ID: Thomas Thomas, male   DOB: March 28, 1956, 60 y.o.   MRN: [id number] TCTS DAILY ICU PROGRESS NOTE                   [address]            [place] [address]          [phone]   3 Days Post-Op Procedure(s) (LRB): CORONARY ARTERY BYPASS GRAFTING (CABG)X 4 LIMA-LAD; SVG-RCA(ENDARDERECTOMY); SVG-DIAG; SVG-OM ENDOSCOPIC GREATER RIGHT SAPHENOUS VEIN HARVEST (N/A) TRANSESOPHAGEAL ECHOCARDIOGRAM (TEE) (N/A)  Total Length of Stay:  LOS: 13 days   Subjective: Walked around 10 times with out SOB  Objective: Vital signs in last 24 hours: Temp:  [97.6 F (36.4 C)-98.5 F (36.9 C)] 98 F (36.7 C) (03/12 0822) Pulse Rate:  [32-115] 97 (03/12 0800) Cardiac Rhythm:  [-] Sinus tachycardia (03/12 0800) Resp:  [0-38] 13 (03/12 0800) BP: (90-152)/(51-78) 94/61 mmHg (03/12 0800) SpO2:  [92 %-97 %] 95 % (03/12 0800) Weight:  [201 lb 9.6 oz (91.445 kg)] 201 lb 9.6 oz (91.445 kg) (03/12 0300)  Filed Weights   01/03/16 0500 01/04/16 0100 01/05/16 0300  Weight: 204 lb 5.9 oz (92.7 kg) 204 lb 9.6 oz (92.806 kg) 201 lb 9.6 oz (91.445 kg)    Weight change: -3 lb (-1.361 kg)   Hemodynamic parameters for last 24 hours:    Intake/Output from previous day: 03/11 0701 - 03/12 0700 In: 720.8 [P.O.:240; I.V.:480.8] Out: 3575 [Urine:3575]  Intake/Output this shift: Total I/O In: 20 [I.V.:20] Out: -   Current Meds: Scheduled Meds: . acetaminophen  1,000 mg Oral 4 times per day   Or  . acetaminophen (TYLENOL) oral liquid 160 mg/5 mL  1,000 mg Per Tube 4 times per day  . aspirin EC  325 mg Oral Daily   Or  . aspirin  324 mg Per Tube Daily  . atorvastatin  80 mg Oral q1800  . bisacodyl  10 mg Oral Daily   Or  . bisacodyl  10 mg Rectal Daily  . docusate sodium  200 mg Oral Daily  . insulin aspart  0-24 Units Subcutaneous 6 times per day  . levalbuterol  1.25 mg Nebulization Q6H  . metoprolol tartrate  12.5 mg Oral BID   Or  . metoprolol tartrate  12.5 mg Per Tube  BID  . pantoprazole  40 mg Oral Daily  . sodium chloride flush  3 mL Intravenous Q12H   Continuous Infusions: . sodium chloride Stopped (01/03/16 1900)  . sodium chloride Stopped (01/03/16 1900)  . lactated ringers 20 mL/hr at 01/05/16 0800  . lactated ringers     PRN Meds:.sodium chloride, metoprolol, morphine injection, ondansetron (ZOFRAN) IV, oxyCODONE, sodium chloride flush, traMADol  General appearance: alert and cooperative Neurologic: intact Heart: regular rate and rhythm, S1, S2 normal, no murmur, click, rub or gallop Lungs: diminished breath sounds bibasilar Abdomen: soft, non-tender; bowel sounds normal; no masses,  no organomegaly Extremities: extremities normal, atraumatic, no cyanosis or edema and Homans sign is negative, no sign of DVT Wound: sternum intact silver dress in place  Lab Results: CBC: Recent Labs  01/04/16 0415 01/05/16 0359  WBC 37.9* 32.4*  HGB 10.1* 9.6*  HCT 32.9* 30.5*  PLT 208 208   BMET:  Recent Labs  01/04/16 0415 01/05/16 0359  NA 131* 131*  K 4.9 4.6  CL 96* 95*  CO2 26 25  GLUCOSE 170* 127*  BUN 12 13  CREATININE 0.99 0.98  CALCIUM 8.5*  8.6*     CO-OX: 52  PT/INR:  Recent Labs  01/02/16 1447  LABPROT 18.0*  INR 1.48   Radiology: Dg Chest Port 1 View  01/05/2016  CLINICAL DATA:  Chest tube EXAM: PORTABLE CHEST 1 VIEW COMPARISON:  01/04/2016 FINDINGS: Interval removal of left chest tube.  No pneumothorax is seen. Atelectasis/ contusion in the left mid lung. Small bilateral pleural effusions. Right IJ venous sheath. The heart is top-normal in size. Postsurgical changes related to prior CABG. Median sternotomy. IMPRESSION: Interval removal of left chest tube.  No pneumothorax is seen. Small bilateral pleural effusions. Electronically Signed   By: Charline BillsSriyesh  Krishnan M.D.   On: 01/05/2016 08:19     Assessment/Plan: S/P Procedure(s) (LRB): CORONARY ARTERY BYPASS GRAFTING (CABG)X 4 LIMA-LAD; SVG-RCA(ENDARDERECTOMY); SVG-DIAG;  SVG-OM ENDOSCOPIC GREATER RIGHT SAPHENOUS VEIN HARVEST (N/A) TRANSESOPHAGEAL ECHOCARDIOGRAM (TEE) (N/A) Mobilize Diuresis D/c central line reanl function stable  Add ace, and use coreg    Delight Ovensdward B Elek Holderness 01/05/2016 9:00 AM

## 2016-01-05 NOTE — Progress Notes (Signed)
Patient ID: Thomas Thomas, male   DOB: 09/24/1956, 60 y.o.   MRN: [id number] EVENING ROUNDS NOTE :     [address]       [place] [id number]             [phone]                 3 Days Post-Op Procedure(s) (LRB): CORONARY ARTERY BYPASS GRAFTING (CABG)X 4 LIMA-LAD; SVG-RCA(ENDARDERECTOMY); SVG-DIAG; SVG-OM ENDOSCOPIC GREATER RIGHT SAPHENOUS VEIN HARVEST (N/A) TRANSESOPHAGEAL ECHOCARDIOGRAM (TEE) (N/A)  Total Length of Stay:  LOS: 13 days  BP 109/62 mmHg  Pulse 106  Temp(Src) 97.4 F (36.3 C) (Oral)  Resp 23  Ht 5\' 6"  (1.676 m)  Wt 201 lb 9.6 oz (91.445 kg)  BMI 32.55 kg/m2  SpO2 94%  .Intake/Output      03/12 0701 - 03/13 0700   P.O. 720   I.V. (mL/kg) 20 (0.2)   Total Intake(mL/kg) 740 (8.1)   Urine (mL/kg/hr) 600 (0.5)   Total Output 600   Net +140         . sodium chloride Stopped (01/03/16 1900)  . sodium chloride Stopped (01/03/16 1900)  . lactated ringers 20 mL/hr at 01/05/16 0800  . lactated ringers       Lab Results  Component Value Date   WBC 32.4* 01/05/2016   HGB 9.6* 01/05/2016   HCT 30.5* 01/05/2016   PLT 208 01/05/2016   GLUCOSE 127* 01/05/2016   CHOL 112 12/24/2015   TRIG 25 12/24/2015   HDL 30* 12/24/2015   LDLCALC 77 12/24/2015   ALT 15* 12/27/2015   AST 21 12/27/2015   NA 131* 01/05/2016   K 4.6 01/05/2016   CL 95* 01/05/2016   CREATININE 0.98 01/05/2016   BUN 13 01/05/2016   CO2 25 01/05/2016   TSH 1.538 12/24/2015   PSA 1.5 11/02/2013   INR 1.48 01/02/2016   HGBA1C 6.7* 12/24/2015   Stable, on ace, coreg, statin, lasix  Delight OvensEdward B Tiffnay Bossi MD  Beeper [phone] Office [phone] 01/05/2016 7:39 PM

## 2016-01-06 LAB — GLUCOSE, CAPILLARY
GLUCOSE-CAPILLARY: 107 mg/dL — AB (ref 65–99)
GLUCOSE-CAPILLARY: 98 mg/dL (ref 65–99)
Glucose-Capillary: 109 mg/dL — ABNORMAL HIGH (ref 65–99)
Glucose-Capillary: 102 mg/dL — ABNORMAL HIGH (ref 65–99)

## 2016-01-06 LAB — CBC
HCT: 29.3 % — ABNORMAL LOW (ref 39.0–52.0)
Hemoglobin: 9.2 g/dL — ABNORMAL LOW (ref 13.0–17.0)
MCH: 27.5 pg (ref 26.0–34.0)
MCHC: 31.4 g/dL (ref 30.0–36.0)
MCV: 87.7 fL (ref 78.0–100.0)
Platelets: 268 10*3/uL (ref 150–400)
RBC: 3.34 MIL/uL — ABNORMAL LOW (ref 4.22–5.81)
RDW: 15.7 % — ABNORMAL HIGH (ref 11.5–15.5)
WBC: 21.2 10*3/uL — ABNORMAL HIGH (ref 4.0–10.5)

## 2016-01-06 LAB — BASIC METABOLIC PANEL
Anion gap: 12 (ref 5–15)
BUN: 16 mg/dL (ref 6–20)
CO2: 26 mmol/L (ref 22–32)
Calcium: 8.7 mg/dL — ABNORMAL LOW (ref 8.9–10.3)
Chloride: 99 mmol/L — ABNORMAL LOW (ref 101–111)
Creatinine, Ser: 0.87 mg/dL (ref 0.61–1.24)
GFR calc Af Amer: >60 mL/min (ref >60)
GFR calc non Af Amer: >60 mL/min (ref >60)
Glucose, Bld: 128 mg/dL — ABNORMAL HIGH (ref 65–99)
Potassium: 4 mmol/L (ref 3.5–5.1)
Sodium: 137 mmol/L (ref 135–145)

## 2016-01-06 MED ORDER — MAGNESIUM HYDROXIDE 400 MG/5ML PO SUSP: 30.0000 mL | Freq: Every day | ORAL | Status: DC | PRN

## 2016-01-06 MED ORDER — POTASSIUM CHLORIDE CRYS ER 20 MEQ PO TBCR
20.0000 meq | EXTENDED_RELEASE_TABLET | Freq: Every day | ORAL | Status: DC
  Administered 2016-01-06 – 2016-01-09 (×4): 20 meq via ORAL
  Filled 2016-01-06 (×4): qty 1

## 2016-01-06 MED ORDER — SODIUM CHLORIDE 0.9 % IV SOLN: 250.0000 mL | INTRAVENOUS | Status: DC | PRN

## 2016-01-06 MED ORDER — MOVING RIGHT ALONG BOOK
Freq: Once | Status: AC
  Administered 2016-01-06: 08:00:00
  Filled 2016-01-06: qty 1

## 2016-01-06 MED ORDER — CLOPIDOGREL BISULFATE 75 MG PO TABS
75.0000 mg | ORAL_TABLET | Freq: Every day | ORAL | Status: DC
  Administered 2016-01-06 – 2016-01-09 (×4): 75 mg via ORAL
  Filled 2016-01-06 (×4): qty 1

## 2016-01-06 MED ORDER — ASPIRIN EC 81 MG PO TBEC
81.0000 mg | DELAYED_RELEASE_TABLET | Freq: Every day | ORAL | Status: DC
  Administered 2016-01-06 – 2016-01-09 (×4): 81 mg via ORAL
  Filled 2016-01-06 (×4): qty 1

## 2016-01-06 MED ORDER — SODIUM CHLORIDE 0.9% FLUSH
3.0000 mL | Freq: Two times a day (BID) | INTRAVENOUS | Status: DC
  Administered 2016-01-06 (×2): 3 mL via INTRAVENOUS

## 2016-01-06 MED ORDER — SODIUM CHLORIDE 0.9% FLUSH: 3.0000 mL | INTRAVENOUS | Status: DC | PRN

## 2016-01-06 MED ORDER — AMIODARONE HCL 200 MG PO TABS
200.0000 mg | ORAL_TABLET | Freq: Two times a day (BID) | ORAL | Status: DC
  Administered 2016-01-06 – 2016-01-09 (×7): 200 mg via ORAL
  Filled 2016-01-06 (×8): qty 1

## 2016-01-06 MED ORDER — SPIRONOLACTONE 25 MG PO TABS
25.0000 mg | ORAL_TABLET | Freq: Every day | ORAL | Status: DC
  Administered 2016-01-06 – 2016-01-09 (×4): 25 mg via ORAL
  Filled 2016-01-06 (×4): qty 1

## 2016-01-06 MED ORDER — CEPHALEXIN 500 MG PO CAPS
500.0000 mg | ORAL_CAPSULE | Freq: Three times a day (TID) | ORAL | Status: DC
  Administered 2016-01-06 – 2016-01-09 (×10): 500 mg via ORAL
  Filled 2016-01-06 (×11): qty 1

## 2016-01-06 MED ORDER — FUROSEMIDE 40 MG PO TABS
40.0000 mg | ORAL_TABLET | Freq: Every day | ORAL | Status: DC
  Administered 2016-01-06 – 2016-01-09 (×4): 40 mg via ORAL
  Filled 2016-01-06 (×4): qty 1

## 2016-01-06 NOTE — Progress Notes (Signed)
Attempted to call report to [place], RN on 2W unavailable to take report at this time. Awaiting call back to give report. Loreta Blouch A Thayne Cindric 12:49 PM

## 2016-01-06 NOTE — Progress Notes (Signed)
4 Days Post-Op Procedure(s) (LRB): CORONARY ARTERY BYPASS GRAFTING (CABG)X 4 LIMA-LAD; SVG-RCA(ENDARDERECTOMY); SVG-DIAG; SVG-OM ENDOSCOPIC GREATER RIGHT SAPHENOUS VEIN HARVEST (N/A) TRANSESOPHAGEAL ECHOCARDIOGRAM (TEE) (N/A) Subjective: Ischemic CM s/p CABG nsr CXR wet- cont lasix, add spiro WBC elevated postop w/o fever ? Stress reaction Objective: Vital signs in last 24 hours: Temp:  [97.4 F (36.3 C)-98.1 F (36.7 C)] 97.5 F (36.4 C) (03/13 0754) Pulse Rate:  [88-111] 94 (03/13 0700) Cardiac Rhythm:  [-] Normal sinus rhythm (03/13 0400) Resp:  [10-28] 23 (03/13 0700) BP: (84-129)/(51-83) 103/66 mmHg (03/13 0700) SpO2:  [87 %-98 %] 94 % (03/13 0700) Weight:  [200 lb 6.4 oz (90.9 kg)] 200 lb 6.4 oz (90.9 kg) (03/13 0500)  Hemodynamic parameters for last 24 hours:  stable  Intake/Output from previous day: 03/12 0701 - 03/13 0700 In: 740 [P.O.:720; I.V.:20] Out: 1675 [Urine:1675] Intake/Output this shift:        Exam    General- alert and comfortable   Lungs- clear without rales, wheezes   Cor- regular rate and rhythm, no murmur , gallop   Abdomen- soft, non-tender   Extremities - warm, non-tender, minimal edema   Neuro- oriented, appropriate, no focal weakness   Slight serosanguinous  drainage upper sternal incision  Lab Results:  Recent Labs  01/04/16 0415 01/05/16 0359  WBC 37.9* 32.4*  HGB 10.1* 9.6*  HCT 32.9* 30.5*  PLT 208 208   BMET:  Recent Labs  01/04/16 0415 01/05/16 0359  NA 131* 131*  K 4.9 4.6  CL 96* 95*  CO2 26 25  GLUCOSE 170* 127*  BUN 12 13  CREATININE 0.99 0.98  CALCIUM 8.5* 8.6*    PT/INR: No results for input(s): LABPROT, INR in the last 72 hours. ABG    Component Value Date/Time   PHART 7.378 01/03/2016 0646   HCO3 25.4* 01/03/2016 0646   TCO2 24 01/03/2016 1612   ACIDBASEDEF 1.0 01/02/2016 2019   O2SAT 52.0 01/05/2016 0330   CBG (last 3)   Recent Labs  01/05/16 2022 01/06/16 0002 01/06/16 0418  GLUCAP 122*  109* 102*    Assessment/Plan: S/P Procedure(s) (LRB): CORONARY ARTERY BYPASS GRAFTING (CABG)X 4 LIMA-LAD; SVG-RCA(ENDARDERECTOMY); SVG-DIAG; SVG-OM ENDOSCOPIC GREATER RIGHT SAPHENOUS VEIN HARVEST (N/A) TRANSESOPHAGEAL ECHOCARDIOGRAM (TEE) (N/A) Mobilize Diuresis cover with antibiotcs tx to stepdown   LOS: 14 days    Kathlee Nationseter Van Trigt III 01/06/2016

## 2016-01-06 NOTE — Progress Notes (Signed)
Nutrition Follow-up  DOCUMENTATION CODES:   Obesity unspecified  INTERVENTION:  -Encouraged general healthful PO intake (Heart Healthy Diet)    NUTRITION DIAGNOSIS:   Inadequate oral intake related to inability to eat as evidenced by NPO status.  -Resolved  GOAL:   Patient will meet greater than or equal to 90% of their needs  -Met  MONITOR:   Vent status, Labs, I & O's, TF tolerance, Skin  ASSESSMENT:   Thomas Thomas is an 60 y.o. male with hx of HTN, anxiety, prior tobacco abuse, presented to the ER with persistent back pain, coughs, and malaise. He was seen in the urgent care, and was given levoquin though he hadn't started as yet. 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  3/3-Extubated  3/4- Advanced to heart healthy diet 3/9-CABG  Pt's PO intake is adequate, per chart pt consumes 75-100% of meals. Last night for dinner pt consumed 20% of his meal but reported it was "bad".   Pt declined any additional nutritional needs.   Pt discussed during interdisciplinary rounds.  Labs reviewed; sodium 131. Medications reviewed.   Diet Order:  Diet Heart Room service appropriate?: Yes; Fluid consistency:: Thin  Skin:  Reviewed, no issues  Last BM:  01/06/2016  Height:   Ht Readings from Last 1 Encounters:  01/06/16 5' 6"  (1.676 m)    Weight:   Wt Readings from Last 1 Encounters:  01/06/16 200 lb 6.4 oz (90.9 kg)    Ideal Body Weight:  64.54 kg  BMI:  Body mass index is 32.36 kg/(m^2).  Estimated Nutritional Needs:   Kcal:  1900-2100  Protein:  80-90 grams  Fluid:  2.1 L/day  EDUCATION NEEDS:   No education needs identified at this time  Raford Pitcher, Dietetic Intern Pager: [phone]  I agree with the Student-Dietitian note and made appropriate revisions.  Arthur Holms, RD, LDN Pager #: [phone] After-Hours Pager #: [phone]

## 2016-01-06 NOTE — Progress Notes (Signed)
Pt received from 2H, central monitoring notified of transfer. Pt assessment consistent with report received from M. Manson PasseyBrown, RN from [id number].  Pt resting comfortably, no complaints of any discomfort at this time.  Pt oriented to room.

## 2016-01-06 NOTE — Progress Notes (Signed)
Transferred pt. To 2W [address] by ambulation, ST 110s when ambulating,  Pt. In chair, Receiving RN at bedside,  RN notified that urine culture needs to be collected, pt belongings collected and at bedside with pt.  Malisa Ruggiero A Markala Sitts 1400

## 2016-01-06 NOTE — Progress Notes (Signed)
Gave report to RN receiving pt. On 2W [name], [place]. Danarius Mcconathy A Maxey Ransom 1:43 PM

## 2016-01-07 ENCOUNTER — Inpatient Hospital Stay (HOSPITAL_COMMUNITY): Payer: BLUE CROSS/BLUE SHIELD

## 2016-01-07 LAB — BASIC METABOLIC PANEL
Anion gap: 11 (ref 5–15)
BUN: 18 mg/dL (ref 6–20)
CO2: 22 mmol/L (ref 22–32)
Calcium: 8.5 mg/dL — ABNORMAL LOW (ref 8.9–10.3)
Chloride: 103 mmol/L (ref 101–111)
Creatinine, Ser: 0.97 mg/dL (ref 0.61–1.24)
GFR calc Af Amer: >60 mL/min (ref >60)
GFR calc non Af Amer: >60 mL/min (ref >60)
Glucose, Bld: 127 mg/dL — ABNORMAL HIGH (ref 65–99)
Potassium: 4 mmol/L (ref 3.5–5.1)
Sodium: 136 mmol/L (ref 135–145)

## 2016-01-07 LAB — CBC
HCT: 29.2 % — ABNORMAL LOW (ref 39.0–52.0)
Hemoglobin: 9.2 g/dL — ABNORMAL LOW (ref 13.0–17.0)
MCH: 27.6 pg (ref 26.0–34.0)
MCHC: 31.5 g/dL (ref 30.0–36.0)
MCV: 87.7 fL (ref 78.0–100.0)
Platelets: 287 10*3/uL (ref 150–400)
RBC: 3.33 MIL/uL — ABNORMAL LOW (ref 4.22–5.81)
RDW: 15.7 % — ABNORMAL HIGH (ref 11.5–15.5)
WBC: 17.9 10*3/uL — ABNORMAL HIGH (ref 4.0–10.5)

## 2016-01-07 LAB — URINE CULTURE
Culture: NO GROWTH
Special Requests: NORMAL

## 2016-01-07 MED ORDER — LEVALBUTEROL HCL 1.25 MG/0.5ML IN NEBU: 1.2500 mg | INHALATION_SOLUTION | Freq: Four times a day (QID) | RESPIRATORY_TRACT | Status: DC | PRN

## 2016-01-07 MED ORDER — LIVING WELL WITH DIABETES BOOK
Freq: Once | Status: AC
  Administered 2016-01-07: 11:00:00
  Filled 2016-01-07: qty 1

## 2016-01-07 NOTE — Progress Notes (Signed)
Order received to DC EPW's.  EPW removed.  Wires intact.  Pt tol well.  Pt educated on one hour bedrest.  Will cont to monitor BP and pulse q 15 minutes.

## 2016-01-07 NOTE — Progress Notes (Signed)
CARDIAC REHAB PHASE I   PRE:  Rate/Rhythm: 99 SR  BP:  Supine:   Sitting: 119/76  Standing:    SaO2: 96%RA  MODE:  Ambulation: 550 ft   POST:  Rate/Rhythm: 101 ST  BP:  Supine:   Sitting: 128/70  Standing:    SaO2: 94%RA 1114-1134  Pt walked 550 ft on RA with rolling walker with steady gait. Tolerated well. Pt motivated to walk. Did well on RA.   Luetta Nuttingharlene Braylyn Eye, RN BSN  01/07/2016 11:30 AM

## 2016-01-07 NOTE — Progress Notes (Signed)
Utilization review completed.  

## 2016-01-07 NOTE — Progress Notes (Addendum)
      [address]       [place] [id number]             [phone]      5 Days Post-Op Procedure(s) (LRB): CORONARY ARTERY BYPASS GRAFTING (CABG)X 4 LIMA-LAD; SVG-RCA(ENDARDERECTOMY); SVG-DIAG; SVG-OM ENDOSCOPIC GREATER RIGHT SAPHENOUS VEIN HARVEST (N/A) TRANSESOPHAGEAL ECHOCARDIOGRAM (TEE) (N/A)   Subjective:  [name] has no complaints.  He is ambulating without difficulty.  + BM  Objective: Vital signs in last 24 hours: Temp:  [97.5 F (36.4 C)-98.6 F (37 C)] 98.4 F (36.9 C) (03/14 0424) Pulse Rate:  [91-108] 97 (03/14 0424) Cardiac Rhythm:  [-] Normal sinus rhythm (03/14 0424) Resp:  [17-28] 18 (03/14 0424) BP: (89-129)/(56-94) 107/62 mmHg (03/14 0424) SpO2:  [91 %-99 %] 94 % (03/14 0424) Weight:  [196 lb 3.4 oz (89 kg)] 196 lb 3.4 oz (89 kg) (03/14 0427)  Intake/Output from previous day: 03/13 0701 - 03/14 0700 In: 600 [P.O.:600] Out: 900 [Urine:900]  General appearance: alert, cooperative and no distress Heart: regular rate and rhythm Lungs: diminished left base Abdomen: soft, non-tender; bowel sounds normal; no masses,  no organomegaly Extremities: edema trace Wound: clean and dry  Lab Results:  Recent Labs  01/06/16 1147 01/07/16 0325  WBC 21.2* 17.9*  HGB 9.2* 9.2*  HCT 29.3* 29.2*  PLT 268 287   BMET:  Recent Labs  01/06/16 1147 01/07/16 0325  NA 137 136  K 4.0 4.0  CL 99* 103  CO2 26 22  GLUCOSE 128* 127*  BUN 16 18  CREATININE 0.87 0.97  CALCIUM 8.7* 8.5*    PT/INR: No results for input(s): LABPROT, INR in the last 72 hours. ABG    Component Value Date/Time   PHART 7.378 01/03/2016 0646   HCO3 25.4* 01/03/2016 0646   TCO2 24 01/03/2016 1612   ACIDBASEDEF 1.0 01/02/2016 2019   O2SAT 52.0 01/05/2016 0330   CBG (last 3)   Recent Labs  01/06/16 0418 01/06/16 0752 01/06/16 1239  GLUCAP 102* 107* 98    Assessment/Plan: S/P Procedure(s) (LRB): CORONARY ARTERY BYPASS GRAFTING (CABG)X 4 LIMA-LAD;  SVG-RCA(ENDARDERECTOMY); SVG-DIAG; SVG-OM ENDOSCOPIC GREATER RIGHT SAPHENOUS VEIN HARVEST (N/A) TRANSESOPHAGEAL ECHOCARDIOGRAM (TEE) (N/A)  1. CV- NSR- continue Coreg, Lisinopril as tolerated, will d/c EPW today 2. Pulm- no acute issues, CXR with small left pleural effusion 3. Renal- creatinine okay, weight is trending down continue lasix 4. DM- hgb A1c is 6.7, this would be a new diagnosis, will place diabetes education consult 5. Dispo- patient stable, d/c EPW, will get diabetes education consult   LOS: 15 days    Raford PitcherBARRETT, ERIN 01/07/2016  Keep patient in hospital for obervation until upper sternal drainage clears and WBC drops < 15k patient examined and medical record reviewed,agree with above note. Kathlee Nationseter Van Trigt III 01/07/2016

## 2016-01-07 NOTE — Progress Notes (Signed)
Inpatient Diabetes Program Recommendations  AACE/ADA: New Consensus Statement on Inpatient Glycemic Control (2015)  Target Ranges:  Prepandial:   less than 140 mg/dL      Peak postprandial:   less than 180 mg/dL (1-2 hours)      Critically ill patients:  140 - 180 mg/dL   Consult received for new-onset DM A1C=6.7 This coordinator met with patient to discuss A1C and lifestyle modification as first line therapy.  Pt states he has not been walking as much and he has been drinking soft drinks daily.  States he has plans to stop drinking soft drinks and start walking more.  Gave patient Diabetes Meal Planning Guide and flyer for free outpatient diabetes classes at [facility].  No questions/concerns at the end of our visit. Thank you  Raoul Pitch BSN, RN,CDE Inpatient Diabetes Coordinator [phone] (team pager)

## 2016-01-08 LAB — CBC
HCT: 29.2 % — ABNORMAL LOW (ref 39.0–52.0)
Hemoglobin: 9.2 g/dL — ABNORMAL LOW (ref 13.0–17.0)
MCH: 27.6 pg (ref 26.0–34.0)
MCHC: 31.5 g/dL (ref 30.0–36.0)
MCV: 87.7 fL (ref 78.0–100.0)
Platelets: 345 10*3/uL (ref 150–400)
RBC: 3.33 MIL/uL — ABNORMAL LOW (ref 4.22–5.81)
RDW: 15.5 % (ref 11.5–15.5)
WBC: 15.2 10*3/uL — ABNORMAL HIGH (ref 4.0–10.5)

## 2016-01-08 MED ORDER — CARVEDILOL 6.25 MG PO TABS
6.2500 mg | ORAL_TABLET | Freq: Two times a day (BID) | ORAL | Status: DC
  Administered 2016-01-08 – 2016-01-09 (×2): 6.25 mg via ORAL
  Filled 2016-01-08 (×2): qty 1

## 2016-01-08 NOTE — Progress Notes (Signed)
      [address]       [place] [id number]             [phone]      6 Days Post-Op Procedure(s) (LRB): CORONARY ARTERY BYPASS GRAFTING (CABG)X 4 LIMA-LAD; SVG-RCA(ENDARDERECTOMY); SVG-DIAG; SVG-OM ENDOSCOPIC GREATER RIGHT SAPHENOUS VEIN HARVEST (N/A) TRANSESOPHAGEAL ECHOCARDIOGRAM (TEE) (N/A)   Subjective:  Mr. Delton Seeelson has no new complaints.  He continues to ambulate without difficulty.  His sternotomy continues to drain serous fluid.  Objective: Vital signs in last 24 hours: Temp:  [97.9 F (36.6 C)-98.1 F (36.7 C)] 98.1 F (36.7 C) (03/15 0331) Pulse Rate:  [89-123] 123 (03/15 0331) Cardiac Rhythm:  [-] Normal sinus rhythm (03/15 0700) Resp:  [18-19] 18 (03/15 0331) BP: (100-132)/(65-79) 128/65 mmHg (03/15 0331) SpO2:  [94 %-95 %] 95 % (03/15 0331) Weight:  [194 lb 4.8 oz (88.134 kg)] 194 lb 4.8 oz (88.134 kg) (03/15 0337)  Intake/Output from previous day: 03/14 0701 - 03/15 0700 In: 600 [P.O.:600] Out: 1000 [Urine:1000]  General appearance: alert, cooperative and no distress Heart: regular rate and rhythm Lungs: diminished breath sounds on the left Abdomen: soft, non-tender; bowel sounds normal; no masses,  no organomegaly Extremities: edema trace Wound: clean and dry  Lab Results:  Recent Labs  01/07/16 0325 01/08/16 0320  WBC 17.9* 15.2*  HGB 9.2* 9.2*  HCT 29.2* 29.2*  PLT 287 345   BMET:  Recent Labs  01/06/16 1147 01/07/16 0325  NA 137 136  K 4.0 4.0  CL 99* 103  CO2 26 22  GLUCOSE 128* 127*  BUN 16 18  CREATININE 0.87 0.97  CALCIUM 8.7* 8.5*    PT/INR: No results for input(s): LABPROT, INR in the last 72 hours. ABG    Component Value Date/Time   PHART 7.378 01/03/2016 0646   HCO3 25.4* 01/03/2016 0646   TCO2 24 01/03/2016 1612   ACIDBASEDEF 1.0 01/02/2016 2019   O2SAT 52.0 01/05/2016 0330   CBG (last 3)   Recent Labs  01/06/16 0418 01/06/16 0752 01/06/16 1239  GLUCAP 102* 107* 98     Assessment/Plan: S/P Procedure(s) (LRB): CORONARY ARTERY BYPASS GRAFTING (CABG)X 4 LIMA-LAD; SVG-RCA(ENDARDERECTOMY); SVG-DIAG; SVG-OM ENDOSCOPIC GREATER RIGHT SAPHENOUS VEIN HARVEST (N/A) TRANSESOPHAGEAL ECHOCARDIOGRAM (TEE) (N/A)  1. CV- Sinus Tach, BP is labile at times- will increase Coreg to 6.25 mg, continue Lisinopril at low dose 2. Pulm- no acute issues, off oxygen, continue IS 3. Renal- creatinine WNL, weight is trending down, continue diuresis 4. ID- leukocytosis improving, continues to have serous drainage from sternotomy.... Monitor 5. DM- education complete, will attempt dietary modification currently 6. Dispo- patient stable, increase Coreg, continue diuresis, home once drainage stops   LOS: 16 days    Ayo Smoak 01/08/2016

## 2016-01-09 MED ORDER — CEPHALEXIN 500 MG PO CAPS: 500.0000 mg | ORAL_CAPSULE | Freq: Three times a day (TID) | ORAL | Status: DC

## 2016-01-09 MED ORDER — ATORVASTATIN CALCIUM 80 MG PO TABS: 80.0000 mg | ORAL_TABLET | Freq: Every day | ORAL | Status: DC

## 2016-01-09 MED ORDER — CARVEDILOL 6.25 MG PO TABS: 6.2500 mg | ORAL_TABLET | Freq: Two times a day (BID) | ORAL | Status: DC

## 2016-01-09 MED ORDER — ASPIRIN 81 MG PO TBEC: 81.0000 mg | DELAYED_RELEASE_TABLET | Freq: Every day | ORAL | Status: AC

## 2016-01-09 MED ORDER — POTASSIUM CHLORIDE CRYS ER 20 MEQ PO TBCR: 20.0000 meq | EXTENDED_RELEASE_TABLET | Freq: Every day | ORAL | Status: DC

## 2016-01-09 MED ORDER — FUROSEMIDE 40 MG PO TABS: 40.0000 mg | ORAL_TABLET | Freq: Every day | ORAL | Status: DC

## 2016-01-09 MED ORDER — LISINOPRIL 2.5 MG PO TABS: 2.5000 mg | ORAL_TABLET | Freq: Every day | ORAL | Status: DC

## 2016-01-09 MED ORDER — AMIODARONE HCL 200 MG PO TABS: 200.0000 mg | ORAL_TABLET | Freq: Two times a day (BID) | ORAL | Status: DC

## 2016-01-09 MED ORDER — CLOPIDOGREL BISULFATE 75 MG PO TABS: 75.0000 mg | ORAL_TABLET | Freq: Every day | ORAL | Status: DC

## 2016-01-09 MED ORDER — TRAMADOL HCL 50 MG PO TABS: 50.0000 mg | ORAL_TABLET | ORAL | Status: DC | PRN

## 2016-01-09 NOTE — Discharge Summary (Signed)
Physician Discharge Summary       [address]       [place] [address]             [phone]    Patient ID: Thomas Thomas MRN: [id number] DOB/AGE: 1956/09/17 60 y.o.  Admit date: 12/23/2015 Discharge date: 01/09/2016  Admission Diagnoses: 1. S/p NSTEMI 2. ACS (acute coronary syndrome) (HCC) 3. Multivessel CAD  Active Diagnoses:  1.  CAP (community acquired pneumonia) 2. Pulmonary edema 3. Hypertension 4. Anxiety 5. Tobacco abuse 6. Newly diagnosed DM (HGA1C 6.7) 7. ABL anemia  Consults: pulmonary/intensive care  Procedure (s): Left Heart Cath and Coronary Angiography by Dr. [place] on 12/25/2015:    Conclusion     Mid RCA lesion, 100% stenosed.  Prox Cx to Mid Cx lesion, 100% stenosed.  Prox LAD lesion, 80% stenosed.  Ost 1st Diag to 1st Diag lesion, 75% stenosed.  1. Critical 3 vessel obstructive CAD  - 80% proximal LAD  - 75% diagonal  - 100% LCx with left to left collaterals.  - 100% mid RCA with left to right collaterals.  2. Markedly elevated LV filling pressures. EDP 45 mm Hg. 3. Acute pulmonary edema with acute respiratory failure requiring intubation.  Plan: ICU- CCM to manage vent. IV diuresis. IV Ntg as tolerated. When medical condition improves would consider CT surgery consult for revascularization.    1. Coronary artery bypass grafting x4 (left internal mammary artery to  LAD, saphenous vein graft to diagonal, saphenous vein graft to obtuse marginal, saphenous vein graft to RCA-posterior descending). 2. Endarterectomy of right coronary artery-posterior descending. 3. Endoscopic harvest of right leg greater saphenous vein by Dr. Donata Clay on 01/02/2016.  History of Presenting Illness: This is a 60 year old Caucasian male reformed smoker admitted from an outside hospital with left lower lobe pneumonia and sepsis. He was tachycardic with nonspecific EKG changes but cardiac enzymes are positive. He was transferred to  this hospital for cardiology evaluation and on March 1 he underwent cardiac catheterization was found to have severe three-vessel coronary disease including totally occluded RCA. During the catheterization he developed flash pulmonary edema and was intubated. His echocardiogram shows moderate inferior wall LV dysfunction without evidence of significant MR. He is being followed by critical care service and is being weaned from the ventilator. He has been hemodynamically stable since his cardiac catheterization and has required no pressors. A cardiothoracic consultation was obtained with Dr. Donata Clay for the consideration of coronary artery bypass grafting surgery. Potential risks, benefits, and complications were discussed with the patient and he agreed to proceed with surgery. Pre operative carotid duplex US showed no significant internal carotid artery stenosis. He underwent a CABG x 4 and coronary endarterectomy of the RCA and PDA on 01/02/2016.  Brief Hospital Course:  The patient was extubated the evening of surgery without difficulty. He remained afebrile and hemodynamically stable. He was weaned off of Milrinone drip once Co ox improved. Theone Murdoch, a line, chest tubes, and foley were removed early in the post operative course. Coreg was started and titrated accordingly. He was put on Amiodarone as well.  He was volume over loaded and diuresed. He had ABL anemia. He did not require a post op transfusion. His last H and H was 9.2 and 29.2. He was put on Spironolactone. He was weaned off the insulin drip.The patient's HGA1C pre op was 6.7 . He is likely a new diabetic and will need follow up with his medical doctor for further evaluation  and treatment. Until then, he will be put on diabetic diet. As recommended by inpatient diabetes consultation. The patient was felt surgically stable for transfer from the ICU to [facility] for further convalescence on 01/07/2016. He did have some mild sternal serous drainage. He  was put on Keflex. He continues to progress with cardiac rehab. He/she was ambulating on room air. He has been tolerating a diet and has had a bowel movement. Epicardial pacing wires were removed. Chest tube sutures will be removed the day of discharge. The patient is felt surgically stable for discharge today.   Latest Vital Signs: Blood pressure 100/60, pulse 96, temperature 98.7 F (37.1 C), temperature source Oral, resp. rate 17, height  (1.676 m), weight 193 lb (87.544 kg), SpO2 96 %.  Physical Exam:  General appearance: alert, cooperative and no distress Heart: regular rate and rhythm Lungs: diminished breath sounds left base Abdomen: soft, non-tender; bowel sounds normal; no masses, no organomegaly Extremities: edema trace Wound: clean and dry  Discharge Condition:Stable and discharged to home  Recent laboratory studies:  Lab Results  Component Value Date   WBC 15.2* 01/08/2016   HGB 9.2* 01/08/2016   HCT 29.2* 01/08/2016   MCV 87.7 01/08/2016   PLT 345 01/08/2016   Lab Results  Component Value Date   NA 136 01/07/2016   K 4.0 01/07/2016   CL 103 01/07/2016   CO2 22 01/07/2016   CREATININE 0.97 01/07/2016   GLUCOSE 127* 01/07/2016    Diagnostic Studies: Dg Chest 2 View  01/07/2016  CLINICAL DATA:  Bypass surgery. EXAM: CHEST  2 VIEW COMPARISON:  01/05/2016 FINDINGS: The heart is enlarged but stable. Stable surgical changes from bypass surgery. The right IJ Cordis has been removed. Persistent small bilateral pleural effusions with overlying atelectasis, left greater than right. No pulmonary edema. No pneumothorax. IMPRESSION: Persistent but improved bilateral pleural effusions and bibasilar atelectasis. No pneumothorax. Electronically Signed   By: Rudie Meyer M.D.   On: 01/07/2016 08:30    Ct Angio Chest Aorta W/cm &/or Wo/cm  12/23/2015  CLINICAL DATA:  Dissection protocol used. Left shoulder pain-radiating to left chest wall, lightheaded, lower back pain x 4  days, chest congestion x 1 week. Denies sob. History of HTN, hernia repair. EXAM: CT ANGIOGRAPHY CHEST, ABDOMEN AND PELVIS TECHNIQUE: Multidetector CT imaging through the chest, abdomen and pelvis was performed using the standard protocol during bolus administration of intravenous contrast. Multiplanar reconstructed images and MIPs were obtained and reviewed to evaluate the vascular anatomy. CONTRAST:  OMNIPAQUE IOHEXOL 350 MG/ML SOLN COMPARISON:  Current chest radiograph FINDINGS: CTA CHEST FINDINGS Stop thoracic aorta is normal in caliber. There is no dissection. Minor partly calcified plaque is noted along the aortic arch and descending thoracic aorta and at the origin of the left subclavian artery. No significant stenosis. Review of the MIP images confirms the above findings. CTA ABDOMEN AND PELVIS FINDINGS Abdominal aorta shows a small fusiform dilation in its infrarenal portion, mass in diameter of 2.4 cm iliac arteries are normal in caliber. There is mild atherosclerotic calcifications along the infrarenal abdominal aorta and iliac vessels. Celiac axis, superior mesenteric artery and single renal arteries bilaterally are widely patent. The inferior mesenteric artery is patent. CT CHEST NON ANGIOGRAPHIC FINDINGS Neck base and axilla:  No mass or adenopathy.  Normal thyroid. Mediastinum and hila: Heart is unremarkable. No mediastinal or hilar masses or significant enlarged lymph nodes. There are several prominent lymph nodes, largest a subcarinal node measuring 11 mm in short  axis. Lungs and pleura: Left lower lobe consolidation consistent with pneumonia as noted on the current chest radiograph. Subtle opacity in the posterior medial right lower lobe is likely atelectasis although could reflect additional infection. Remainder of the lungs is clear. No pleural effusion or pneumothorax. CT ABDOMEN AND PELVIS NON ANGIOGRAPHIC FINDINGS Liver, spleen, gallbladder, pancreas, adrenal glands:  Normal. Kidneys,  ureters, bladder: 17 mm exophytic low-density upper pole right renal mass consistent with a cyst. 6 mm hyper attenuating mass protruding from the posterior margin of the right kidney midpole, nonspecific. No other renal masses or lesions. No hydronephrosis. Normal ureters. Bladder is unremarkable. Lymph nodes:  No adenopathy. Ascites:  None. Gastrointestinal:  Unremarkable.  No appendix visualized. MUSCULOSKELETAL FINDINGS Mild degenerative endplate spurring along the lower thoracic spine. No osteoblastic or osteolytic lesions. Review of the MIP images confirms the above findings. IMPRESSION: 1. No evidence of an aortic dissection. 2. Small fusiform dilation of the infrarenal abdominal aorta measuring 2.4 cm. Consider follow-up with ultrasound in 1 year to reassess size. 3. Left lower lobe consolidation consistent with pneumonia as noted on the current chest radiograph. 4. No other acute findings within the chest, abdomen or pelvis. Electronically Signed   By: Amie Portlandavid  Ormond M.D.   On: 12/23/2015 20:34   Ct Cta Abd/pel W/cm &/or W/o Cm  12/23/2015  CLINICAL DATA:  Dissection protocol used. Left shoulder pain-radiating to left chest wall, lightheaded, lower back pain x 4 days, chest congestion x 1 week. Denies sob. History of HTN, hernia repair. EXAM: CT ANGIOGRAPHY CHEST, ABDOMEN AND PELVIS TECHNIQUE: Multidetector CT imaging through the chest, abdomen and pelvis was performed using the standard protocol during bolus administration of intravenous contrast. Multiplanar reconstructed images and MIPs were obtained and reviewed to evaluate the vascular anatomy. CONTRAST:  100mL OMNIPAQUE IOHEXOL 350 MG/ML SOLN COMPARISON:  Current chest radiograph FINDINGS: CTA CHEST FINDINGS Stop thoracic aorta is normal in caliber. There is no dissection. Minor partly calcified plaque is noted along the aortic arch and descending thoracic aorta and at the origin of the left subclavian artery. No significant stenosis. Review of the  MIP images confirms the above findings. CTA ABDOMEN AND PELVIS FINDINGS Abdominal aorta shows a small fusiform dilation in its infrarenal portion, mass in diameter of 2.4 cm iliac arteries are normal in caliber. There is mild atherosclerotic calcifications along the infrarenal abdominal aorta and iliac vessels. Celiac axis, superior mesenteric artery and single renal arteries bilaterally are widely patent. The inferior mesenteric artery is patent. CT CHEST NON ANGIOGRAPHIC FINDINGS Neck base and axilla:  No mass or adenopathy.  Normal thyroid. Mediastinum and hila: Heart is unremarkable. No mediastinal or hilar masses or significant enlarged lymph nodes. There are several prominent lymph nodes, largest a subcarinal node measuring 11 mm in short axis. Lungs and pleura: Left lower lobe consolidation consistent with pneumonia as noted on the current chest radiograph. Subtle opacity in the posterior medial right lower lobe is likely atelectasis although could reflect additional infection. Remainder of the lungs is clear. No pleural effusion or pneumothorax. CT ABDOMEN AND PELVIS NON ANGIOGRAPHIC FINDINGS Liver, spleen, gallbladder, pancreas, adrenal glands:  Normal. Kidneys, ureters, bladder: 17 mm exophytic low-density upper pole right renal mass consistent with a cyst. 6 mm hyper attenuating mass protruding from the posterior margin of the right kidney midpole, nonspecific. No other renal masses or lesions. No hydronephrosis. Normal ureters. Bladder is unremarkable. Lymph nodes:  No adenopathy. Ascites:  None. Gastrointestinal:  Unremarkable.  No appendix visualized. MUSCULOSKELETAL FINDINGS Mild  degenerative endplate spurring along the lower thoracic spine. No osteoblastic or osteolytic lesions. Review of the MIP images confirms the above findings. IMPRESSION: 1. No evidence of an aortic dissection. 2. Small fusiform dilation of the infrarenal abdominal aorta measuring 2.4 cm. Consider follow-up with ultrasound in 1  year to reassess size. 3. Left lower lobe consolidation consistent with pneumonia as noted on the current chest radiograph. 4. No other acute findings within the chest, abdomen or pelvis. Electronically Signed   By: Amie Portland M.D.   On: 12/23/2015 20:34        Discharge Instructions    Amb Referral to Cardiac Rehabilitation    Complete by:  As directed   Diagnosis:   CABG Myocardial Infarction             Discharge Medications:   Medication List    STOP taking these medications        hydrochlorothiazide 25 MG tablet  Commonly known as:  HYDRODIURIL     naproxen 375 MG tablet  Commonly known as:  NAPROSYN      TAKE these medications        amiodarone 200 MG tablet  Commonly known as:  PACERONE  Take 1 tablet (200 mg total) by mouth 2 (two) times daily. For 7 days, then decrease to 200 mg daily     aspirin 81 MG EC tablet  Take 1 tablet (81 mg total) by mouth daily.     atorvastatin 80 MG tablet  Commonly known as:  LIPITOR  Take 1 tablet (80 mg total) by mouth daily at 6 PM.     carvedilol 6.25 MG tablet  Commonly known as:  COREG  Take 1 tablet (6.25 mg total) by mouth 2 (two) times daily with a meal.     cephALEXin 500 MG capsule  Commonly known as:  KEFLEX  Take 1 capsule (500 mg total) by mouth every 8 (eight) hours. For 7 days     clopidogrel 75 MG tablet  Commonly known as:  PLAVIX  Take 1 tablet (75 mg total) by mouth daily.     furosemide 40 MG tablet  Commonly known as:  LASIX  Take 1 tablet (40 mg total) by mouth daily. For 7 Days     lisinopril 2.5 MG tablet  Commonly known as:  PRINIVIL,ZESTRIL  Take 1 tablet (2.5 mg total) by mouth daily.     potassium chloride SA 20 MEQ tablet  Commonly known as:  K-DUR,KLOR-CON  Take 1 tablet (20 mEq total) by mouth daily. For 7 Days     traMADol 50 MG tablet  Commonly known as:  ULTRAM  Take 1-2 tablets (50-100 mg total) by mouth every 4 (four) hours as needed for moderate pain.      The  patient has been discharged on:   1.Beta Blocker:  Yes [ x  ]                              No   [   ]                              If No, reason:  2.Ace Inhibitor/ARB: Yes [ x  ]  No  [    ]                                     If No, reason:  3.Statin:   Yes [  x ]                  No  [   ]                  If No, reason:  4.Ecasa:  Yes  [  x ]                  No   [   ]                  If No, reason:  Follow Up Appointments: Follow-up Information    Follow up with Kerin Perna III, MD On 02/05/2016.   Specialty:  Cardiothoracic Surgery   Why:  PA/LAT CXR (to be taken at [facility] which is in the same building as Dr. Zenaida Niece Trigt's office) on 02/05/2016 at 11:00 am;Appointment time is at 11:45 am   Contact information:   [address] [employer] [address] [place] [address] [phone]       Follow up with Donato Schultz, MD In 2 weeks.   Specialty:  Cardiology   Why:  Call for a follow up appointment for 2 weeks   Contact information:   [address] [place] [address] [phone]       Follow up with Jannifer Rodney, FNP.   Specialty:  Family Medicine   Why:  Call for a follow up appointment regarding diabetes management and further surveillance of HGA1C 6.7    Contact information:   [address] [place] [address] [phone]       Follow up with [facility].   Why:  HHRN arranged- they will call you to arrange home visits   Contact information:   [address] [place] [address] [phone]       Signed: Marrion Coy 01/09/2016, 3:01 PM

## 2016-01-09 NOTE — Progress Notes (Signed)
      [address]       [place] [id number]             [phone]      7 Days Post-Op Procedure(s) (LRB): CORONARY ARTERY BYPASS GRAFTING (CABG)X 4 LIMA-LAD; SVG-RCA(ENDARDERECTOMY); SVG-DIAG; SVG-OM ENDOSCOPIC GREATER RIGHT SAPHENOUS VEIN HARVEST (N/A) TRANSESOPHAGEAL ECHOCARDIOGRAM (TEE) (N/A)   Subjective:  [name] has no complaints.  He continues to ambulate independently.  He is hopeful to be discharged today.  Objective: Vital signs in last 24 hours: Temp:  [97.5 F (36.4 C)-98.9 F (37.2 C)] 98.7 F (37.1 C) (03/16 0539) Pulse Rate:  [96-113] 96 (03/16 0539) Cardiac Rhythm:  [-] Normal sinus rhythm (03/15 1900) Resp:  [17-20] 17 (03/16 0539) BP: (100-130)/(60-76) 100/60 mmHg (03/16 0539) SpO2:  [95 %-98 %] 96 % (03/16 0539) Weight:  [193 lb (87.544 kg)] 193 lb (87.544 kg) (03/16 0539)  Intake/Output from previous day: 03/15 0701 - 03/16 0700 In: 480 [P.O.:480] Out: 1900 [Urine:1900]  General appearance: alert, cooperative and no distress Heart: regular rate and rhythm Lungs: diminished breath sounds left base Abdomen: soft, non-tender; bowel sounds normal; no masses,  no organomegaly Extremities: edema trace Wound: clean and dry  Lab Results:  Recent Labs  01/07/16 0325 01/08/16 0320  WBC 17.9* 15.2*  HGB 9.2* 9.2*  HCT 29.2* 29.2*  PLT 287 345   BMET:  Recent Labs  01/06/16 1147 01/07/16 0325  NA 137 136  K 4.0 4.0  CL 99* 103  CO2 26 22  GLUCOSE 128* 127*  BUN 16 18  CREATININE 0.87 0.97  CALCIUM 8.7* 8.5*    PT/INR: No results for input(s): LABPROT, INR in the last 72 hours. ABG    Component Value Date/Time   PHART 7.378 01/03/2016 0646   HCO3 25.4* 01/03/2016 0646   TCO2 24 01/03/2016 1612   ACIDBASEDEF 1.0 01/02/2016 2019   O2SAT 52.0 01/05/2016 0330   CBG (last 3)   Recent Labs  01/06/16 0752 01/06/16 1239  GLUCAP 107* 98    Assessment/Plan: S/P Procedure(s) (LRB): CORONARY ARTERY BYPASS GRAFTING  (CABG)X 4 LIMA-LAD; SVG-RCA(ENDARDERECTOMY); SVG-DIAG; SVG-OM ENDOSCOPIC GREATER RIGHT SAPHENOUS VEIN HARVEST (N/A) TRANSESOPHAGEAL ECHOCARDIOGRAM (TEE) (N/A)  1. CV- maintaining NSR, tachycardia improved- continue Coreg, Lisinopril 2. Pulm- no acute issues, continue IS 3. Renal- weight continues to trend down, continue diuresis 4. ID- sternal drainage continues to decrease, dressing did not need changed yesterday 5. Dispo- patient stable, will discuss discharge with staff, however likely d/c home today   LOS: 17 days    Thomas Thomas 01/09/2016

## 2016-01-09 NOTE — Progress Notes (Signed)
[phone] Pt has been walking independently. He stated he has rollator at home if needed. Education completed with pt who voiced understanding. Encouraged IS and sternal precautions. Discussed carb counting and gave pt heart healthy and diabetic diets. Discussed CRP 2 and will refer to [place]. Put on discharge video for pt to view. Pt very receptive to ed. Luetta Nuttingharlene Erroll Wilbourne RN BSN 01/09/2016 9:22 AM

## 2016-01-09 NOTE — Progress Notes (Signed)
Pt given discharge instructions, medication lists, follow up appointments, and when to call the doctor.  Pt verbalizes understanding. [facility] [place] [facility]. Thomas HoffBurton, Taryn Nave McClintock, RN

## 2016-01-09 NOTE — Discharge Instructions (Signed)
Coronary Artery Bypass Grafting, Care After °Refer to this sheet in the next few weeks. These instructions provide you with information on caring for yourself after your procedure. Your health care provider may also give you more specific instructions. Your treatment has been planned according to current medical practices, but problems sometimes occur. Call your health care provider if you have any problems or questions after your procedure. °WHAT TO EXPECT AFTER THE PROCEDURE °Recovery from surgery will be different for everyone. Some people feel well after 3 or 4 weeks, while for others it takes longer. After your procedure, it is typical to have the following: °· Nausea and a lack of appetite.   °· Constipation. °· Weakness and fatigue.   °· Depression or irritability.   °· Pain or discomfort at your incision site. °HOME CARE INSTRUCTIONS °· Take medicines only as directed by your health care provider. Do not stop taking medicines or start any new medicines without first checking with your health care provider. °· Take your pulse as directed by your health care provider. °· Perform deep breathing as directed by your health care provider. If you were given a device called an incentive spirometer, use it to practice deep breathing several times a day. Support your chest with a pillow or your arms when you take deep breaths or cough. °· Keep incision areas clean, dry, and protected. Remove or change any bandages (dressings) only as directed by your health care provider. You may have skin adhesive strips over the incision areas. Do not take the strips off. They will fall off on their own. °· Check incision areas daily for any swelling, redness, or drainage. °· If incisions were made in your legs, do the following: °¨ Avoid crossing your legs.   °¨ Avoid sitting for long periods of time. Change positions every 30 minutes.   °¨ Elevate your legs when you are sitting. °· Wear compression stockings as directed by your  health care provider. These stockings help keep blood clots from forming in your legs. °· Take showers once your health care provider approves. Until then, only take sponge baths. Pat incisions dry. Do not rub incisions with a washcloth or towel. Do not take baths, swim, or use a hot tub until your health care provider approves. °· Eat foods that are high in fiber, such as raw fruits and vegetables, whole grains, beans, and nuts. Meats should be lean cut. Avoid canned, processed, and fried foods. °· Drink enough fluid to keep your urine clear or pale yellow. °· Weigh yourself every day. This helps identify if you are retaining fluid that may make your heart and lungs work harder. °· Rest and limit activity as directed by your health care provider. You may be instructed to: °¨ Stop any activity at once if you have chest pain, shortness of breath, irregular heartbeats, or dizziness. Get help right away if you have any of these symptoms. °¨ Move around frequently for short periods or take short walks as directed by your health care provider. Increase your activities gradually. You may need physical therapy or cardiac rehabilitation to help strengthen your muscles and build your endurance. °¨ Avoid lifting, pushing, or pulling anything heavier than 10 lb (4.5 kg) for at least 6 weeks after surgery. °· Do not drive until your health care provider approves.  °· Ask your health care provider when you may return to work. °· Ask your health care provider when you may resume sexual activity. °· Keep all follow-up visits as directed by your health care   provider. This is important. °SEEK MEDICAL CARE IF: °· You have swelling, redness, increasing pain, or drainage at the site of an incision. °· You have a fever. °· You have swelling in your ankles or legs. °· You have pain in your legs.   °· You gain 2 or more pounds (0.9 kg) a day. °· You are nauseous or vomit. °· You have diarrhea.  °SEEK IMMEDIATE MEDICAL CARE IF: °· You have  chest pain that goes to your jaw or arms. °· You have shortness of breath.   °· You have a fast or irregular heartbeat.   °· You notice a "clicking" in your breastbone (sternum) when you move.   °· You have numbness or weakness in your arms or legs. °· You feel dizzy or light-headed.   °MAKE SURE YOU: °· Understand these instructions. °· Will watch your condition. °· Will get help right away if you are not doing well or get worse. °  °This information is not intended to replace advice given to you by your health care provider. Make sure you discuss any questions you have with your health care provider. °  °Document Released: 05/01/2005 Document Revised: 11/02/2014 Document Reviewed: 03/21/2013 °Elsevier Interactive Patient Education ©2016 [employer] ° °Endoscopic Saphenous Vein Harvesting, Care After °Refer to this sheet in the next few weeks. These instructions provide you with information on caring for yourself after your procedure. Your health care provider may also give you more specific instructions. Your treatment has been planned according to current medical practices, but problems sometimes occur. Call your health care provider if you have any problems or questions after your procedure. °HOME CARE INSTRUCTIONS °Medicine °· Take whatever pain medicine your surgeon prescribes. Follow the directions carefully. Do not take over-the-counter pain medicine unless your surgeon says it is okay. Some pain medicine can cause bleeding problems for several weeks after surgery. °· Follow your surgeon's instructions about driving. You will probably not be permitted to drive after heart surgery. °· Take any medicines your surgeon prescribes. Any medicines you took before your heart surgery should be checked with your health care provider before you start taking them again. °Wound care °· If your surgeon has prescribed an elastic bandage or stocking, ask how long you should wear it. °· Check the area around your surgical  cuts (incisions) whenever your bandages (dressings) are changed. Look for any redness or swelling. °· You will need to return to have the stitches (sutures) or staples taken out. Ask your surgeon when to do that. °· Ask your surgeon when you can shower or bathe. °Activity °· Try to keep your legs raised when you are sitting. °· Do any exercises your health care providers have given you. These may include deep breathing exercises, coughing, walking, or other exercises. °SEEK MEDICAL CARE IF: °· You have any questions about your medicines. °· You have more leg pain, especially if your pain medicine stops working. °· New or growing bruises develop on your leg. °· Your leg swells, feels tight, or becomes red. °· You have numbness in your leg. °SEEK IMMEDIATE MEDICAL CARE IF: °· Your pain gets much worse. °· Blood or fluid leaks from any of the incisions. °· Your incisions become warm, swollen, or red. °· You have chest pain. °· You have trouble breathing. °· You have a fever. °· You have more pain near your leg incision. °MAKE SURE YOU: °· Understand these instructions. °· Will watch your condition. °· Will get help right away if you are not doing well or   get worse. °  °This information is not intended to replace advice given to you by your health care provider. Make sure you discuss any questions you have with your health care provider. °  °Document Released: 06/24/2011 Document Revised: 11/02/2014 Document Reviewed: 06/24/2011 °Elsevier Interactive Patient Education ©2016 Elsevier Inc. ° ° °

## 2016-01-09 NOTE — Care Management Note (Signed)
Case Management Note Donn PieriniKristi Tessy Pawelski RN, BSN Unit 2W-Case Manager [phone]  Patient Details  Name: Thomas CollinDennis Thomas MRN: [id number] Date of Birth: 03/04/1956  Subjective/Objective:    Pt admitted with sepsis/pna/NSTEMI- s/p CABGx4 on 3/9                Action/Plan: PTA pt lived at home/independent- plan is for pt to go to girlfriends at discharge- (address [address] [place] [address]) pt's contact # [phone], alternate-girlfriends #- [phone]-- [facility] with pt at bedside [facility] [place]- per pt choice he would [facility]- referral called [facility] Darl P[facility]Susan [facility]  Expected Discharge Date:   01/09/16               Expected Discharge Plan:  Home w Home Health Services  In-House Referral:     Discharge planning Services  CM Consult  Post Acute Care Choice:  Home Health Choice offered to:  Patient  DME Arranged:  N/A DME Agency:  NA  HH Arranged:  RN HH Agency:  [facility]  Status of Service:  Completed, signed off  Medicare Important Message Given:    Date Medicare IM Given:    Medicare IM give by:    Date Additional Medicare IM Given:    Additional Medicare Important Message give by:     If discussed at Long Length of Stay Meetings, dates discussed:    Discharge Disposition: home/home health   Additional Comments:  Darrold SpanWebster, Micahel Omlor Hall, RN 01/09/2016, 11:26 AM

## 2016-01-10 DIAGNOSIS — Z48812 Encounter for surgical aftercare following surgery on the circulatory system: Secondary | ICD-10-CM | POA: Diagnosis not present

## 2016-01-27 ENCOUNTER — Ambulatory Visit (INDEPENDENT_AMBULATORY_CARE_PROVIDER_SITE_OTHER): Payer: BLUE CROSS/BLUE SHIELD | Admitting: Physician Assistant

## 2016-01-27 ENCOUNTER — Encounter: Payer: Self-pay | Admitting: Physician Assistant

## 2016-01-27 VITALS — BP 120/70 | HR 68 | Ht 66.0 in | Wt 187.0 lb

## 2016-01-27 DIAGNOSIS — I2510 Atherosclerotic heart disease of native coronary artery without angina pectoris: Secondary | ICD-10-CM

## 2016-01-27 DIAGNOSIS — I2583 Coronary atherosclerosis due to lipid rich plaque: Principal | ICD-10-CM

## 2016-01-27 DIAGNOSIS — Z951 Presence of aortocoronary bypass graft: Secondary | ICD-10-CM | POA: Diagnosis not present

## 2016-01-27 NOTE — Progress Notes (Signed)
Cardiology Office Note   Date:  01/27/2016   ID:  Thomas Thomas, DOB 03/24/1956, MRN [id number]  PCP:  Thomas Rodneyhristy Hawks, FNP  Cardiologist:  Dr Thurmon FairSkains  Katlynn Naser, PA-C   Chief Complaint  Patient presents with  . Follow-up    no chest pain, no swelling or cramping, no shortness of breath, no dizziness or lightheadedness    History of Present Illness: Thomas Thomas is a 60 y.o. male with a history of HTN, anxiety.  D/c 03/16 after admit for NSTEMI, PNA, pulm edema, DM (new dx), CABG w/ LIMA-LAD, SVG-D, SVG-OM, SVG-RCA-PDA. EF 55-60 percent with grade 2 diastolic dysfunction by echo.  Thomas Thomas presents for Post hospital follow-up  Since discharge from the hospital, he feels he has done pretty well. He is gradually increasing his activity. He is up to approximately 1100 mL on the incentive spirometer. The lower extremity edema is improving. He is compliant with his medications. He has been strugging a bit with constipation, but is relieving this with Colace and/or prune juice.  He has not had any chest pain. He feels he is recovering from his pneumonia and is not having any fevers or productive cough. He is walking with his wife and doing well with this. He is looking forward to cardiac rehabilitation.  He has 2 sutures where his chest tubes were that have not been removed and he is concerned about this.   Past Medical History  Diagnosis Date  . Hypertension   . Anxiety   . NSTEMI (non-ST elevated myocardial infarction) (HCC) 12/23/2015  . Diabetes mellitus type II, non insulin dependent (HCC) 11/2015    Past Surgical History  Procedure Laterality Date  . Knee surgery    . Hernia repair    . Cardiac catheterization N/A 12/25/2015    Procedure: Left Heart Cath and Coronary Angiography;  Surgeon: Peter M SwazilandJordan, MD;  [facility];  Service: Cardiovascular;  Laterality: N/A;  . Coronary artery bypass graft N/A 01/02/2016    Procedure: CORONARY ARTERY BYPASS  GRAFTING (CABG)X 4 LIMA-LAD; SVG-RCA(ENDARDERECTOMY); SVG-DIAG; SVG-OM ENDOSCOPIC GREATER RIGHT SAPHENOUS VEIN HARVEST;  Surgeon: Kerin PernaPeter Van Trigt, MD; LIMA-LAD, SVG-D, SVG-OM, SVG-RCA-PDA    . Tee without cardioversion N/A 01/02/2016    Procedure: TRANSESOPHAGEAL ECHOCARDIOGRAM (TEE);  Surgeon: Kerin PernaPeter Van Trigt, MD;  [facility];  Service: Open Heart Surgery;  Laterality: N/A;  . Koreas echocardiography  12/24/2015    EF 55-60%, grade 1 dd    Current Outpatient Prescriptions  Medication Sig Dispense Refill  . amiodarone (PACERONE) 200 MG tablet Take 1 tablet (200 mg total) by mouth 2 (two) times daily. For 7 days, then decrease to 200 mg daily 60 tablet 1  . aspirin EC 81 MG EC tablet Take 1 tablet (81 mg total) by mouth daily.    Marland Kitchen. atorvastatin (LIPITOR) 80 MG tablet Take 1 tablet (80 mg total) by mouth daily at 6 PM. 30 tablet 3  . carvedilol (COREG) 6.25 MG tablet Take 1 tablet (6.25 mg total) by mouth 2 (two) times daily with a meal. 60 tablet 3  . clopidogrel (PLAVIX) 75 MG tablet Take 1 tablet (75 mg total) by mouth daily. 30 tablet 3  . docusate sodium (COLACE) 100 MG capsule Take 100 mg by mouth 2 (two) times daily.    . furosemide (LASIX) 40 MG tablet Take 1 tablet (40 mg total) by mouth daily. For 7 Days 7 tablet 0  . lisinopril (PRINIVIL,ZESTRIL) 2.5 MG tablet Take 1  tablet (2.5 mg total) by mouth daily. 30 tablet 3   No current facility-administered medications for this visit.    Allergies:   Review of patient's allergies indicates no known allergies.    Social History:  The patient  reports that he quit smoking about 3 years ago. He does not have any smokeless tobacco history on file. He reports that he does not drink alcohol or use illicit drugs.   Family History:  The patient's family history includes Cancer in his father; Heart disease in his mother.    ROS:  Please see the history of present illness. All other systems are reviewed and negative.    PHYSICAL EXAM: VS:  BP  120/70 mmHg  Pulse 68  Ht  (1.676 m)  Wt 187 lb (84.823 kg)  BMI 30.20 kg/m2 , BMI Body mass index is 30.2 kg/(m^2). GEN: Well nourished, well developed, male in no acute distress HEENT: normal for age  Neck: no JVD, no carotid bruit, no masses Cardiac: RRR; no murmur, no rubs, or gallops Respiratory:  Decreased breath sounds bases with a few rales bilaterally, normal work of breathing GI: soft, nontender, nondistended, + BS MS: no deformity or atrophy; trace edema; distal pulses are 2+ in all 4 extremities  Skin: warm and dry, no rash Neuro:  Strength and sensation are intact Psych: euthymic mood, full affect   EKG:  EKG is ordered today. The ekg ordered today demonstrates sinus rhythm with abnormal T waves in the anterolateral leads consistent with recent MI   Recent Labs: 12/23/2015: B Natriuretic Peptide 77.0 12/24/2015: TSH 1.538 12/27/2015: ALT 15* 01/03/2016: Magnesium 2.4 01/07/2016: BUN 18; Creatinine, Ser 0.97; Potassium 4.0; Sodium 136 01/08/2016: Hemoglobin 9.2*; Platelets 345    Lipid Panel    Component Value Date/Time   CHOL 112 12/24/2015 0020   CHOL 140 07/03/2014 1021   TRIG 25 12/24/2015 0020   HDL 30* 12/24/2015 0020   HDL 39* 07/03/2014 1021   CHOLHDL 3.7 12/24/2015 0020   CHOLHDL 3.6 07/03/2014 1021   VLDL 5 12/24/2015 0020   LDLCALC 77 12/24/2015 0020   LDLCALC 92 07/03/2014 1021     Wt Readings from Last 3 Encounters:  01/27/16 187 lb (84.823 kg)  01/09/16 193 lb (87.544 kg)  12/16/15 202 lb (91.627 kg)     Other studies Reviewed: Additional studies/ records that were reviewed today include: Hospital records and testing..  ASSESSMENT AND PLAN:  1.  s/p CABG: [name] is recovering well after his bypass surgery. Contacted the TCT S office and they advised it would be okay for me to remove the sutures in his chest which I did without difficulty. There are no signs of infection at any of his incisions. He and his wife have several questions  about how to increase his activity, and he is encouraged to bring this up when he sees the surgeons in the next week or so. For now, he is to continue to increase his activity gradually but limited upper body activity and not lift over 5 pounds. No driving until cleared by the surgeons.  2. Non-STEMI: He is on appropriate medical therapy with aspirin, high-dose statin, beta blocker, Plavix and lisinopril.  3. Constipation: He is encouraged to use branch use or Colace were both as needed.   Current medicines are reviewed at length with the patient today.  The patient does not have concerns regarding medicines.  The following changes have been made:  no change  Labs/ tests ordered today  include:   Disposition:   FU with Dr. Anne Fu in 3 months.  Tawny Asal  01/27/2016 11:57 AM    [facility] Phone: [phone]; Fax: [phone]  This note was written with the assistance of speech recognition software. Please excuse any transcriptional errors.

## 2016-01-27 NOTE — Patient Instructions (Signed)
Your physician recommends that you continue on your current medications as directed. Please refer to the Current Medication list given to you today.  Rhonda Barrett, PA-C, recommends that you schedule a follow-up appointment in 3 months with Dr Anne FuSkains.  If you need a refill on your cardiac medications before your next appointment, please call your pharmacy.

## 2016-01-28 DIAGNOSIS — I2510 Atherosclerotic heart disease of native coronary artery without angina pectoris: Secondary | ICD-10-CM | POA: Diagnosis not present

## 2016-01-28 DIAGNOSIS — F419 Anxiety disorder, unspecified: Secondary | ICD-10-CM | POA: Diagnosis not present

## 2016-01-28 DIAGNOSIS — I10 Essential (primary) hypertension: Secondary | ICD-10-CM | POA: Diagnosis not present

## 2016-01-28 DIAGNOSIS — Z48812 Encounter for surgical aftercare following surgery on the circulatory system: Secondary | ICD-10-CM | POA: Diagnosis not present

## 2016-01-28 DIAGNOSIS — Z951 Presence of aortocoronary bypass graft: Secondary | ICD-10-CM | POA: Diagnosis not present

## 2016-01-30 ENCOUNTER — Other Ambulatory Visit: Payer: Self-pay | Admitting: *Deleted

## 2016-01-30 DIAGNOSIS — Z951 Presence of aortocoronary bypass graft: Secondary | ICD-10-CM

## 2016-02-05 ENCOUNTER — Ambulatory Visit (INDEPENDENT_AMBULATORY_CARE_PROVIDER_SITE_OTHER): Payer: Self-pay | Admitting: Cardiothoracic Surgery

## 2016-02-05 ENCOUNTER — Encounter: Payer: Self-pay | Admitting: Cardiothoracic Surgery

## 2016-02-05 ENCOUNTER — Ambulatory Visit
Admission: RE | Admit: 2016-02-05 | Discharge: 2016-02-05 | Disposition: A | Discharge status: Home / Self Care | Payer: BLUE CROSS/BLUE SHIELD | Source: Ambulatory Visit | Attending: Cardiothoracic Surgery | Admitting: Cardiothoracic Surgery

## 2016-02-05 VITALS — BP 123/78 | HR 67 | Resp 16 | Ht 66.0 in | Wt 185.0 lb

## 2016-02-05 DIAGNOSIS — Z951 Presence of aortocoronary bypass graft: Secondary | ICD-10-CM

## 2016-02-05 DIAGNOSIS — J90 Pleural effusion, not elsewhere classified: Secondary | ICD-10-CM | POA: Diagnosis not present

## 2016-02-05 NOTE — Progress Notes (Signed)
PCP is Jannifer Rodneyhristy Hawks, FNP Referring Provider is [name], [name], FNP  Chief Complaint  Patient presents with  . Routine Post Op    f/u from surgery with CXR s/p Coronary artery bypass grafting x4 on 01/02/16    [id number]:first postop visit after urgent CABG x4 month ago for severe three-vessel coronary disease and non-ST elevation MI. The patient required right coronary endarterectomy at the time of surgery. He is been on Plavix. He had transient atrial fibrillation which converted to sinus rhythm on oral amiodarone. He is doing very well. His had no recurrent angina. The surgical incisions are well-healed. He has no symptoms of CHF. He had some transient postop depression but now is doing fine. He is anxious to resume doing his yard work and working at the [employer] [place] He is walking 30-45 minutes daily.  Past Medical History  Diagnosis Date  . Hypertension   . Anxiety   . NSTEMI (non-ST elevated myocardial infarction) (HCC) 12/23/2015  . Diabetes mellitus type II, non insulin dependent (HCC) 11/2015    Past Surgical History  Procedure Laterality Date  . Knee surgery    . Hernia repair    . Cardiac catheterization N/A 12/25/2015    Procedure: Left Heart Cath and Coronary Angiography;  Surgeon: Kyrstyn Greear M SwazilandJordan, MD;  [facility];  Service: Cardiovascular;  Laterality: N/A;  . Coronary artery bypass graft N/A 01/02/2016    Procedure: CORONARY ARTERY BYPASS GRAFTING (CABG)X 4 LIMA-LAD; SVG-RCA(ENDARDERECTOMY); SVG-DIAG; SVG-OM ENDOSCOPIC GREATER RIGHT SAPHENOUS VEIN HARVEST;  Surgeon: Kerin PernaPeter Van Trigt, MD; LIMA-LAD, SVG-D, SVG-OM, SVG-RCA-PDA    . Tee without cardioversion N/A 01/02/2016    Procedure: TRANSESOPHAGEAL ECHOCARDIOGRAM (TEE);  Surgeon: Kerin PernaPeter Van Trigt, MD;  [facility];  Service: Open Heart Surgery;  Laterality: N/A;  . [place] echocardiography  12/24/2015    EF 55-60%, grade 1 dd    Family History  Problem Relation Age of Onset  . Heart disease  Mother   . Cancer Father     stomach    Social History Social History  Substance Use Topics  . Smoking status: Former Smoker    Quit date: 06/06/2012  . Smokeless tobacco: None  . Alcohol Use: No    Current Outpatient Prescriptions  Medication Sig Dispense Refill  . amiodarone (PACERONE) 200 MG tablet Take 1 tablet (200 mg total) by mouth 2 (two) times daily. For 7 days, then decrease to 200 mg daily 60 tablet 1  . aspirin EC 81 MG EC tablet Take 1 tablet (81 mg total) by mouth daily.    Marland Kitchen. atorvastatin (LIPITOR) 80 MG tablet Take 1 tablet (80 mg total) by mouth daily at 6 PM. 30 tablet 3  . carvedilol (COREG) 6.25 MG tablet Take 1 tablet (6.25 mg total) by mouth 2 (two) times daily with a meal. 60 tablet 3  . clopidogrel (PLAVIX) 75 MG tablet Take 1 tablet (75 mg total) by mouth daily. 30 tablet 3  . lisinopril (PRINIVIL,ZESTRIL) 2.5 MG tablet Take 1 tablet (2.5 mg total) by mouth daily. 30 tablet 3   No current facility-administered medications for this visit.    No Known Allergies  Review of Systems  Improved strength Improved appetite Minimal sternal clicking No peripheral edema  BP 123/78 mmHg  Pulse 67  Resp 16  Ht 5\' 6"  (1.676 m)  Wt 185 lb (83.915 kg)  BMI 29.87 kg/m2  SpO2 98% Physical Exam Alert and comfortable Lungs clear Sternum well-healed without instability Heart  rate regular without murmur or gallop Abdomen soft Leg incision is well-healed No pedal edema  Diagnostic Tests:  Chest x-ray clear Impression: Excellent early course after urgent CABG x4 We discussed increasing his activity limits to include driving, he may lift up to 15 pounds, he may do light yard work. We discussed stopping the Plavix and amiodarone when  the current prescription vial  runs out.  Plan: Return for review progress in 4-6 weeks. He understands the importance of sternal precautions.  Mikey Bussing, MD [facility] [phone]

## 2016-04-01 ENCOUNTER — Encounter: Payer: BLUE CROSS/BLUE SHIELD | Admitting: Cardiothoracic Surgery

## 2016-04-08 ENCOUNTER — Ambulatory Visit (INDEPENDENT_AMBULATORY_CARE_PROVIDER_SITE_OTHER): Payer: BLUE CROSS/BLUE SHIELD | Admitting: Cardiothoracic Surgery

## 2016-04-08 ENCOUNTER — Encounter: Payer: Self-pay | Admitting: Cardiothoracic Surgery

## 2016-04-08 VITALS — BP 141/77 | HR 72 | Resp 16 | Ht 66.0 in | Wt 186.0 lb

## 2016-04-08 DIAGNOSIS — Z951 Presence of aortocoronary bypass graft: Secondary | ICD-10-CM

## 2016-04-08 NOTE — Progress Notes (Signed)
PCP is Jannifer Rodneyhristy Hawks, FNP Referring Provider is [name], [name], FNP  Chief Complaint  Patient presents with  . Routine Post Op    9 WK F/U s/p CABG 01/02/16    HPI: Scheduled 2 month followup after multivessel CABG for severe three-vessel coronary disease, unstable angina Patient doing well now without recurrent symptoms of angina or symptoms of CHF. The patient is walking over 2.5 miles daily he is planning or returning to work tomorrow. The patient has been compliant with his diet, medications, and exercise program No chest x-ray done today  Past Medical History  Diagnosis Date  . Hypertension   . Anxiety   . NSTEMI (non-ST elevated myocardial infarction) (HCC) 12/23/2015  . Diabetes mellitus type II, non insulin dependent (HCC) 11/2015    Past Surgical History  Procedure Laterality Date  . Knee surgery    . Hernia repair    . Cardiac catheterization N/A 12/25/2015    Procedure: Left Heart Cath and Coronary Angiography;  Surgeon: Alyssia Heese M SwazilandJordan, MD;  [facility];  Service: Cardiovascular;  Laterality: N/A;  . Coronary artery bypass graft N/A 01/02/2016    Procedure: CORONARY ARTERY BYPASS GRAFTING (CABG)X 4 LIMA-LAD; SVG-RCA(ENDARDERECTOMY); SVG-DIAG; SVG-OM ENDOSCOPIC GREATER RIGHT SAPHENOUS VEIN HARVEST;  Surgeon: Kerin PernaPeter Van Trigt, MD; LIMA-LAD, SVG-D, SVG-OM, SVG-RCA-PDA    . Tee without cardioversion N/A 01/02/2016    Procedure: TRANSESOPHAGEAL ECHOCARDIOGRAM (TEE);  Surgeon: Kerin PernaPeter Van Trigt, MD;  [facility];  Service: Open Heart Surgery;  Laterality: N/A;  . [place] echocardiography  12/24/2015    EF 55-60%, grade 1 dd    Family History  Problem Relation Age of Onset  . Heart disease Mother   . Cancer Father     stomach    Social History Social History  Substance Use Topics  . Smoking status: Former Smoker    Quit date: 06/06/2012  . Smokeless tobacco: None  . Alcohol Use: No    Current Outpatient Prescriptions  Medication Sig Dispense Refill  .  amiodarone (PACERONE) 200 MG tablet Take 1 tablet (200 mg total) by mouth 2 (two) times daily. For 7 days, then decrease to 200 mg daily 60 tablet 1  . aspirin EC 81 MG EC tablet Take 1 tablet (81 mg total) by mouth daily.    Marland Kitchen. atorvastatin (LIPITOR) 80 MG tablet Take 1 tablet (80 mg total) by mouth daily at 6 PM. 30 tablet 3  . carvedilol (COREG) 6.25 MG tablet Take 1 tablet (6.25 mg total) by mouth 2 (two) times daily with a meal. 60 tablet 3  . clopidogrel (PLAVIX) 75 MG tablet Take 1 tablet (75 mg total) by mouth daily. 30 tablet 3  . lisinopril (PRINIVIL,ZESTRIL) 2.5 MG tablet Take 1 tablet (2.5 mg total) by mouth daily. 30 tablet 3   No current facility-administered medications for this visit.    No Known Allergies  Review of Systems     incision well-healed No edema No significant incisional pain  BP 141/77 mmHg  Pulse 72  Resp 16  Ht 5\' 6"  (1.676 m)  Wt 186 lb (84.369 kg)  BMI 30.04 kg/m2  SpO2 98% Physical Exam Alert and comfortable Lungs clear Sternum well-healed Cardiac rhythm stable without murmur or gallop Abdomen soft Leg incision is well-healed, no pedal edema  Diagnostic Tests: none  Impression: Excellent 2 month recovery after multivessel CABG. Patient is released return to work Patient understands his limitation and lifting to be less than 30 pounds until 3 months after  the day of surgery.  Plan:patient can stop his amiodarone and Plavix with her current prescription supply runs out He will continue his Coreg, lisinopril, Lipitor, aspirin Return as needed  Mikey Bussing, MD [facility] [phone]

## 2016-05-22 ENCOUNTER — Ambulatory Visit (INDEPENDENT_AMBULATORY_CARE_PROVIDER_SITE_OTHER): Payer: BLUE CROSS/BLUE SHIELD | Admitting: Cardiology

## 2016-05-22 ENCOUNTER — Encounter: Payer: Self-pay | Admitting: Cardiology

## 2016-05-22 VITALS — BP 150/80 | HR 84 | Ht 66.0 in | Wt 197.4 lb

## 2016-05-22 DIAGNOSIS — Z951 Presence of aortocoronary bypass graft: Secondary | ICD-10-CM | POA: Diagnosis not present

## 2016-05-22 DIAGNOSIS — I2583 Coronary atherosclerosis due to lipid rich plaque: Secondary | ICD-10-CM

## 2016-05-22 DIAGNOSIS — I2510 Atherosclerotic heart disease of native coronary artery without angina pectoris: Secondary | ICD-10-CM | POA: Diagnosis not present

## 2016-05-22 DIAGNOSIS — I214 Non-ST elevation (NSTEMI) myocardial infarction: Secondary | ICD-10-CM

## 2016-05-22 MED ORDER — ATORVASTATIN CALCIUM 80 MG PO TABS: 80.0000 mg | ORAL_TABLET | Freq: Every day | ORAL | 3 refills | Status: DC

## 2016-05-22 MED ORDER — CARVEDILOL 6.25 MG PO TABS: 6.2500 mg | ORAL_TABLET | Freq: Two times a day (BID) | ORAL | 3 refills | Status: DC

## 2016-05-22 MED ORDER — LISINOPRIL 2.5 MG PO TABS: 2.5000 mg | ORAL_TABLET | Freq: Every day | ORAL | 3 refills | Status: DC

## 2016-05-22 MED ORDER — CLOPIDOGREL BISULFATE 75 MG PO TABS: 75.0000 mg | ORAL_TABLET | Freq: Every day | ORAL | 3 refills | Status: DC

## 2016-05-22 NOTE — Progress Notes (Signed)
Cardiology Office Note    Date:  05/22/2016   ID:  Thomas Thomas, DOB 1956/09/20, MRN [id number]  PCP:  Jannifer Rodney, FNP  Cardiologist:   Donato Schultz, MD   No chief complaint on file.   History of Present Illness:  Thomas Thomas is a 60 y.o. male with coronary artery disease status post CABG for severe three-vessel coronary artery disease in the setting of unstable angina, Dr. Maren Beach.  CABG 4 on 01/02/16.  Overall feeling well, walking 2-1/2 miles a day, working again, no signs of chest pain or anginal symptoms. EF normal.  Stopped postoperative amiodarone  Dull ache in back. Had this first since hospital. He works at [employer], [employer]. He's been very pleased with the way he feels.  Discussed sildenafil. Not on nitrate.   No CP, no SOB, no orthopnea. Using air cooker and enjoying this.    Past Medical History:  Diagnosis Date  . Anxiety   . Diabetes mellitus type II, non insulin dependent (HCC) 11/2015  . Hypertension   . NSTEMI (non-ST elevated myocardial infarction) (HCC) 12/23/2015    Past Surgical History:  Procedure Laterality Date  . CARDIAC CATHETERIZATION N/A 12/25/2015   Procedure: Left Heart Cath and Coronary Angiography;  Surgeon: Peter M Swaziland, MD;  Location: [facility]VASIVE CV LAB;  Service: Cardiovascular;  Laterality: N/A;  . CORONARY ARTERY BYPASS GRAFT N/A 01/02/2016   Procedure: CORONARY ARTERY BYPASS GRAFTING (CABG)X 4 LIMA-LAD; SVG-RCA(ENDARDERECTOMY); SVG-DIAG; SVG-OM ENDOSCOPIC GREATER RIGHT SAPHENOUS VEIN HARVEST;  Surgeon: Kerin Perna, MD; LIMA-LAD, SVG-D, SVG-OM, SVG-RCA-PDA    . HERNIA REPAIR    . KNEE SURGERY    . TEE WITHOUT CARDIOVERSION N/A 01/02/2016   Procedure: TRANSESOPHAGEAL ECHOCARDIOGRAM (TEE);  Surgeon: Kerin Perna, MD;  Location: [facility] OR;  Service: Open Heart Surgery;  Laterality: N/A;  . US ECHOCARDIOGRAPHY  12/24/2015   EF 55-60%, grade 1 dd    Current Medications: Outpatient Medications Prior to Visit  Medication Sig  Dispense Refill  . aspirin EC 81 MG EC tablet Take 1 tablet (81 mg total) by mouth daily.    Marland Kitchen atorvastatin (LIPITOR) 80 MG tablet Take 1 tablet (80 mg total) by mouth daily at 6 PM. 30 tablet 3  . carvedilol (COREG) 6.25 MG tablet Take 1 tablet (6.25 mg total) by mouth 2 (two) times daily with a meal. 60 tablet 3  . lisinopril (PRINIVIL,ZESTRIL) 2.5 MG tablet Take 1 tablet (2.5 mg total) by mouth daily. 30 tablet 3  . amiodarone (PACERONE) 200 MG tablet Take 1 tablet (200 mg total) by mouth 2 (two) times daily. For 7 days, then decrease to 200 mg daily 60 tablet 1  . clopidogrel (PLAVIX) 75 MG tablet Take 1 tablet (75 mg total) by mouth daily. 30 tablet 3   No facility-administered medications prior to visit.     Off amiodarone.    Allergies:   Review of patient's allergies indicates no known allergies.   Social History   Social History  . Marital status: Married    Spouse name: N/A  . Number of children: N/A  . Years of education: N/A   Social History Main Topics  . Smoking status: Former Smoker    Quit date: 06/06/2012  . Smokeless tobacco: None  . Alcohol use No  . Drug use: No  . Sexual activity: Not Asked   Other Topics Concern  . None   Social History Narrative  . None     Family History:  The patient's family history  includes Cancer in his father; Heart disease in his mother.   ROS:   Please see the history of present illness.    ROS All other systems reviewed and are negative.   PHYSICAL EXAM:   VS:  BP (!) 150/80 (BP Location: Right Arm, Patient Position: Sitting, Cuff Size: Normal)   Pulse 84   Ht  (1.676 m)   Wt 197 lb 6.4 oz (89.5 kg)   BMI 31.86 kg/m    GEN: Well nourished, well developed, in no acute distress  HEENT: normal  Neck: no JVD, carotid bruits, or masses Cardiac: RRR; no murmurs, rubs, or gallops,no edema Chest scar well healing Respiratory:  clear to auscultation bilaterally, normal work of breathing GI: soft, nontender,  nondistended, + BS MS: no deformity or atrophy  Skin: warm and dry, no rash Neuro:  Alert and Oriented x 3, Strength and sensation are intact Psych: euthymic mood, full affect  Wt Readings from Last 3 Encounters:  05/22/16 197 lb 6.4 oz (89.5 kg)  04/08/16 186 lb (84.4 kg)  02/05/16 185 lb (83.9 kg)      Studies/Labs Reviewed:   EKG:  none  Recent Labs: 12/23/2015: B Natriuretic Peptide 77.0 12/24/2015: TSH 1.538 12/27/2015: ALT 15 01/03/2016: Magnesium 2.4 01/07/2016: BUN 18; Creatinine, Ser 0.97; Potassium 4.0; Sodium 136 01/08/2016: Hemoglobin 9.2; Platelets 345   Lipid Panel    Component Value Date/Time   CHOL 112 12/24/2015 0020   CHOL 140 07/03/2014 1021   TRIG 25 12/24/2015 0020   HDL 30 (L) 12/24/2015 0020   HDL 39 (L) 07/03/2014 1021   CHOLHDL 3.7 12/24/2015 0020   VLDL 5 12/24/2015 0020   LDLCALC 77 12/24/2015 0020   LDLCALC 92 07/03/2014 1021    Additional studies/ records that were reviewed today include:  Prior lab work, office notes, EKGs reviewed    ASSESSMENT:    1. NSTEMI (non-ST elevated myocardial infarction) (HCC)   2. Coronary artery disease due to lipid rich plaque   3. S/P CABG x 5      PLAN:  In order of problems listed above:  Coronary artery disease status post CABG  - Doing very well  - No anginal symptoms  - Continue secondary prevention with atorvastatin, beta blocker, aspirin  History of non-ST elevation myocardial infarction  - Plavix continue for 1 year post MI   Hyperlipidemia  -  statin use.   Medication Adjustments/Labs and Tests Ordered: Current medicines are reviewed at length with the patient today.  Concerns regarding medicines are outlined above.  Medication changes, Labs and Tests ordered today are listed in the Patient Instructions below. Patient Instructions  Medication Instructions:  Please continue Plavix  75 mg a day until after March 2018. Continue all other medications as listed.  Follow-Up: Follow up  in 4 months with Dr Anne Fu.  Thank you for choosing [facility]!!          Signed, Donato Schultz, MD  05/22/2016 4:44 PM    [facility] [address] [place], [place], [place]  [address] Phone: [phone]; Fax: [phone]

## 2016-05-22 NOTE — Patient Instructions (Signed)
Medication Instructions:  Please continue Plavix  75 mg a day until after March 2018. Continue all other medications as listed.  Follow-Up: Follow up in 4 months with Dr Anne Fu.  Thank you for choosing [facility]!!

## 2016-07-27 ENCOUNTER — Ambulatory Visit (INDEPENDENT_AMBULATORY_CARE_PROVIDER_SITE_OTHER): Payer: BLUE CROSS/BLUE SHIELD | Admitting: Family

## 2016-07-27 ENCOUNTER — Encounter: Payer: Self-pay | Admitting: Family

## 2016-07-27 ENCOUNTER — Telehealth: Payer: Self-pay | Admitting: Family

## 2016-07-27 VITALS — BP 141/86 | HR 70 | Temp 97.0°F | Ht 66.0 in | Wt 201.0 lb

## 2016-07-27 DIAGNOSIS — J029 Acute pharyngitis, unspecified: Secondary | ICD-10-CM

## 2016-07-27 MED ORDER — AMOXICILLIN-POT CLAVULANATE 875-125 MG PO TABS: 1.0000 | ORAL_TABLET | Freq: Two times a day (BID) | ORAL | 0 refills | Status: DC

## 2016-07-27 NOTE — Patient Instructions (Signed)

## 2016-07-27 NOTE — Progress Notes (Signed)
   Subjective:    Patient ID: Jamal CollinDennis Searcy, male    DOB: 07/20/1956, 60 y.o.   MRN: [id number]  Cough  This is a new problem. The current episode started today. The problem has been unchanged. The problem occurs every few minutes. The cough is productive of sputum. Associated symptoms include chills, nasal congestion, postnasal drip, rhinorrhea and a sore throat. Pertinent negatives include no ear congestion, ear pain, fever, headaches, myalgias, shortness of breath or wheezing. The symptoms are aggravated by lying down. He has tried rest for the symptoms. The treatment provided mild relief.      Review of Systems  Constitutional: Positive for chills. Negative for fever.  HENT: Positive for postnasal drip, rhinorrhea and sore throat. Negative for ear pain.   Respiratory: Positive for cough. Negative for shortness of breath and wheezing.   Musculoskeletal: Negative for myalgias.  Neurological: Negative for headaches.  All other systems reviewed and are negative.      Objective:   Physical Exam  Constitutional: He is oriented to person, place, and time. He appears well-developed and well-nourished. No distress.  HENT:  Head: Normocephalic.  Right Ear: External ear normal.  Left Ear: External ear normal.  Nose: Mucosal edema and rhinorrhea present.  Mouth/Throat: Oropharyngeal exudate, posterior oropharyngeal edema and posterior oropharyngeal erythema present.  Eyes: Pupils are equal, round, and reactive to light. Right eye exhibits no discharge. Left eye exhibits no discharge.  Neck: Normal range of motion. Neck supple. No thyromegaly present.  Cardiovascular: Normal rate, regular rhythm, normal heart sounds and intact distal pulses.   No murmur heard. Pulmonary/Chest: Effort normal and breath sounds normal. No respiratory distress. He has no wheezes.  Abdominal: Soft. Bowel sounds are normal. He exhibits no distension. There is no tenderness.  Musculoskeletal: Normal range of motion.  He exhibits no edema or tenderness.  Neurological: He is alert and oriented to person, place, and time. He has normal reflexes. No cranial nerve deficit.  Skin: Skin is warm and dry. No rash noted. No erythema.  Psychiatric: He has a normal mood and affect. His behavior is normal. Judgment and thought content normal.  Vitals reviewed.     BP (!) 141/86   Pulse 70   Temp 97 F (36.1 C) (Oral)   Ht 5\' 6"  (1.676 m)   Wt 201 lb (91.2 kg)   BMI 32.44 kg/m      Assessment & Plan:  1. Acute pharyngitis, unspecified etiology -- Take meds as prescribed - Use a cool mist humidifier  -Use saline nose sprays frequently -Saline irrigations of the nose can be very helpful if done frequently.  * 4X daily for 1 week*  * Use of a nettie pot can be helpful with this. Follow directions with this* -Force fluids -For any cough or congestion  Use plain Mucinex- regular strength or max strength is fine   * Children- consult with Pharmacist for dosing -For fever or aces or pains- take tylenol or ibuprofen appropriate for age and weight.  * for fevers greater than 101 orally you may alternate ibuprofen and tylenol every  3 hours. -Throat lozenges if help -New toothbrush in 3 days - amoxicillin-clavulanate (AUGMENTIN) 875-125 MG tablet; Take 1 tablet by mouth 2 (two) times daily.  Dispense: 14 tablet; Refill: 0  Jannifer Rodneyhristy Halynn Reitano, FNP

## 2016-09-10 ENCOUNTER — Encounter: Payer: Self-pay | Admitting: *Deleted

## 2016-09-13 IMAGING — CR DG CHEST 1V PORT
2 series · 2 of 2 positions shown · non-contrast
Comparison: 01/02/2016

CLINICAL DATA: Status post CABG

EXAM:
PORTABLE CHEST 1 VIEW

[AP (1 of 2)]
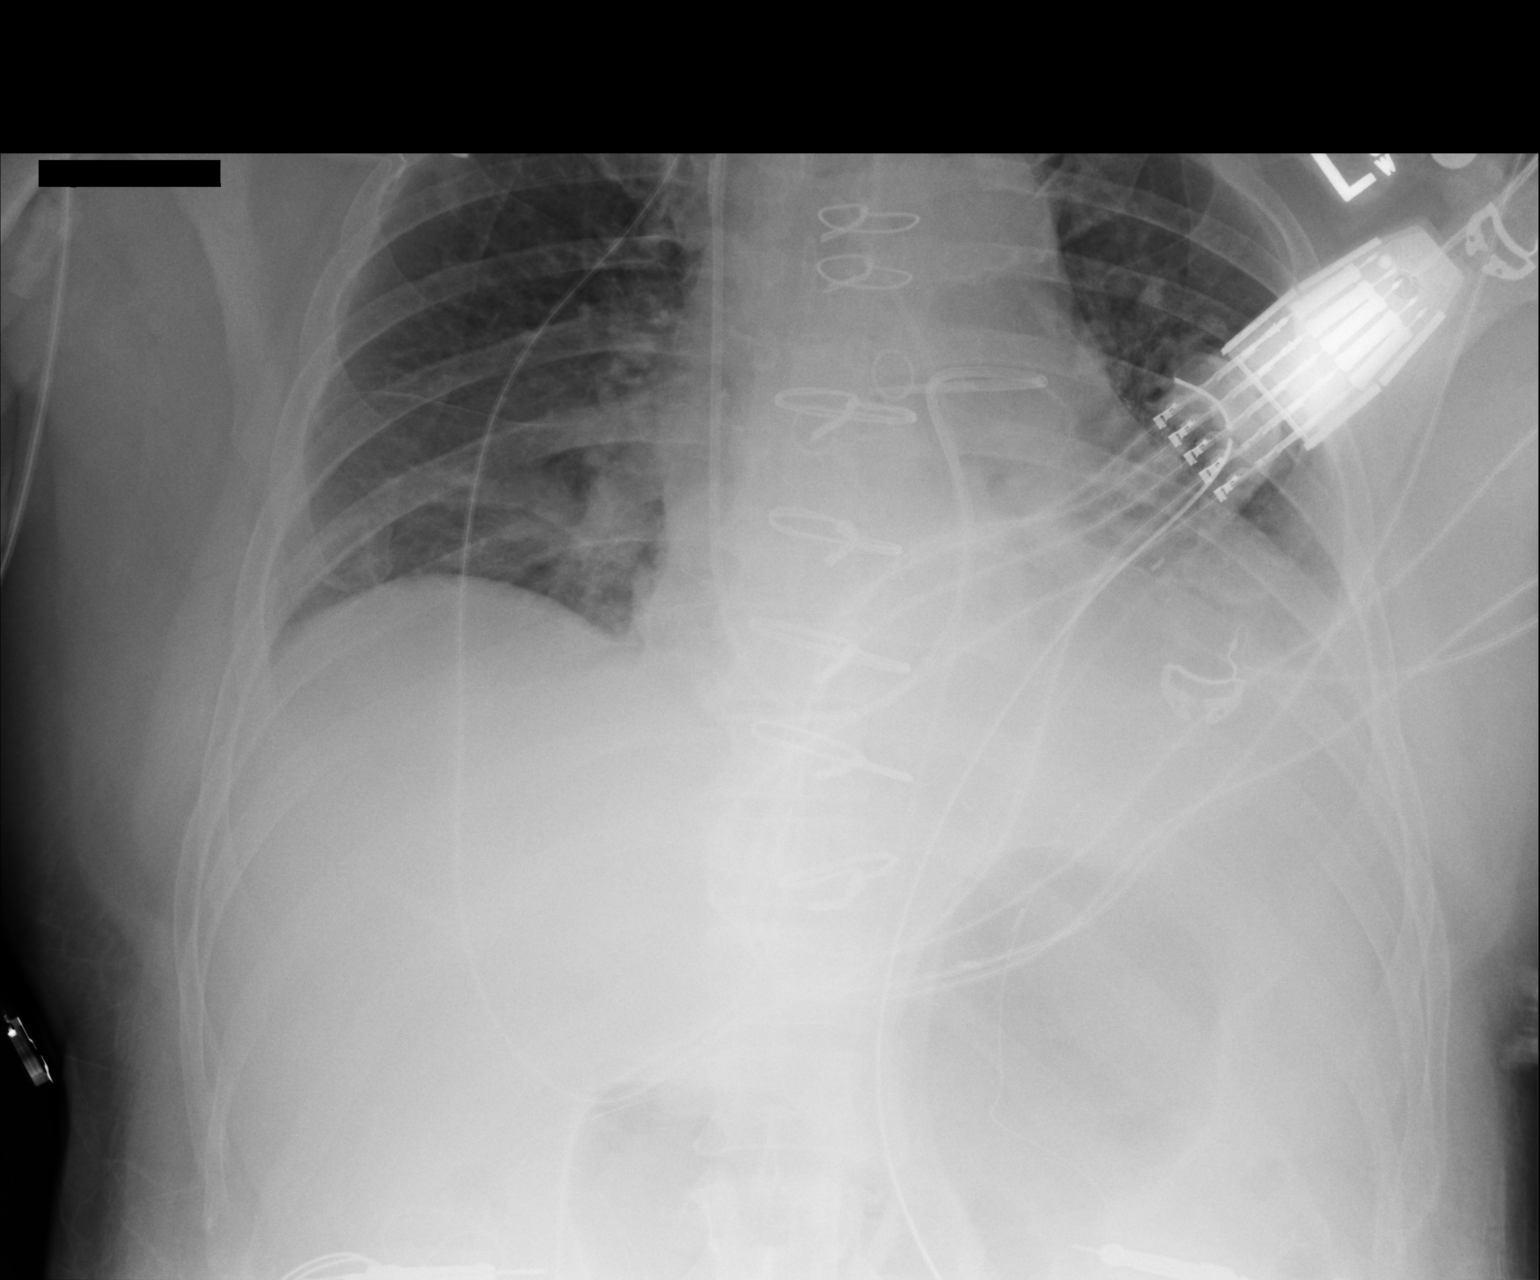

[AP (2 of 2)]
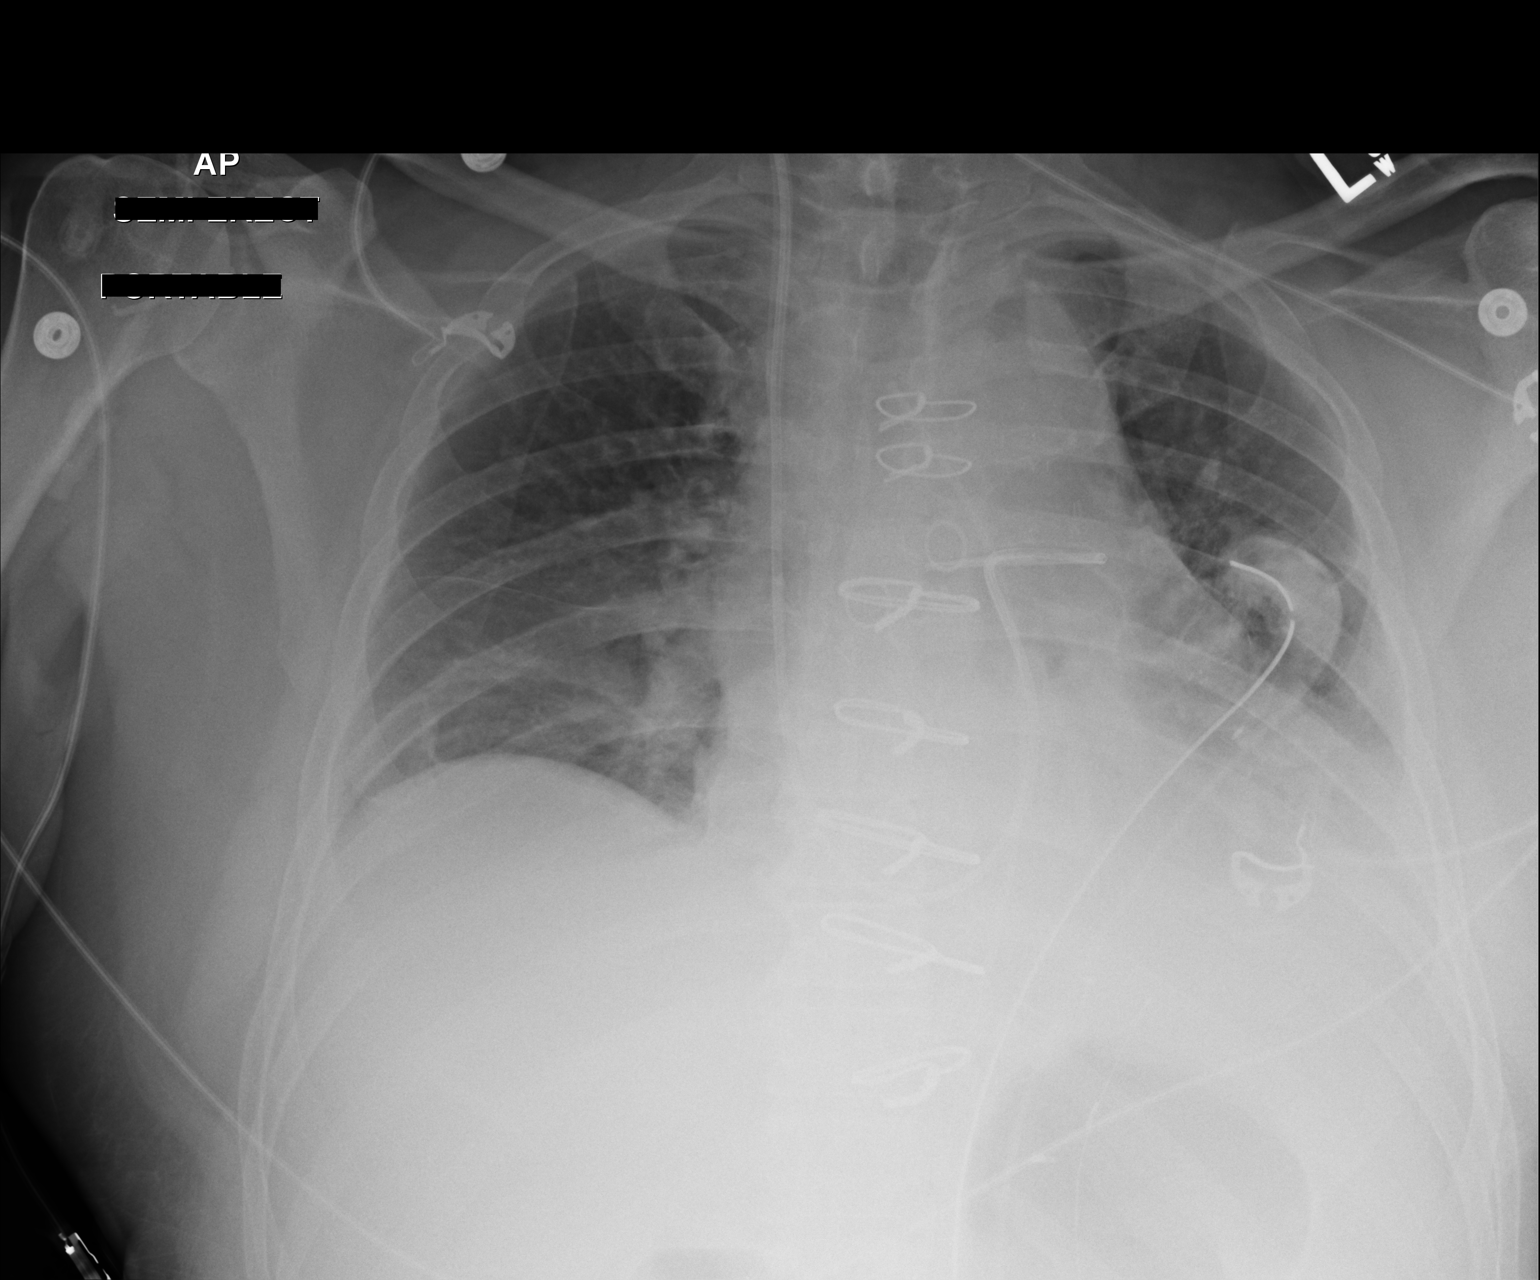

[2 of 2 positions shown; findings below may reference images not displayed]

FINDINGS: Klaus-Werner central line again looped within the main pulmonary artery
and tip extending into the proximal right main pulmonary artery.
Left chest tube has been removed. Endotracheal tube and NG tube have
been removed.

No pneumothorax. Enlargement cardiac silhouette stable. Mild right
lower lobe atelectasis. Retrocardiac opacity and small left effusion
stable.
IMPRESSION: Stable bibasilar opacities. Interval removal of numerous lines and
tubes with Klaus-Werner central line as described.

## 2016-09-14 IMAGING — CR DG CHEST 1V PORT
1 series · 1 of 1 positions shown · non-contrast
Comparison: 01/03/2016

CLINICAL DATA: Pneumothorax, shortness of breath

EXAM:
PORTABLE CHEST 1 VIEW

[AP]
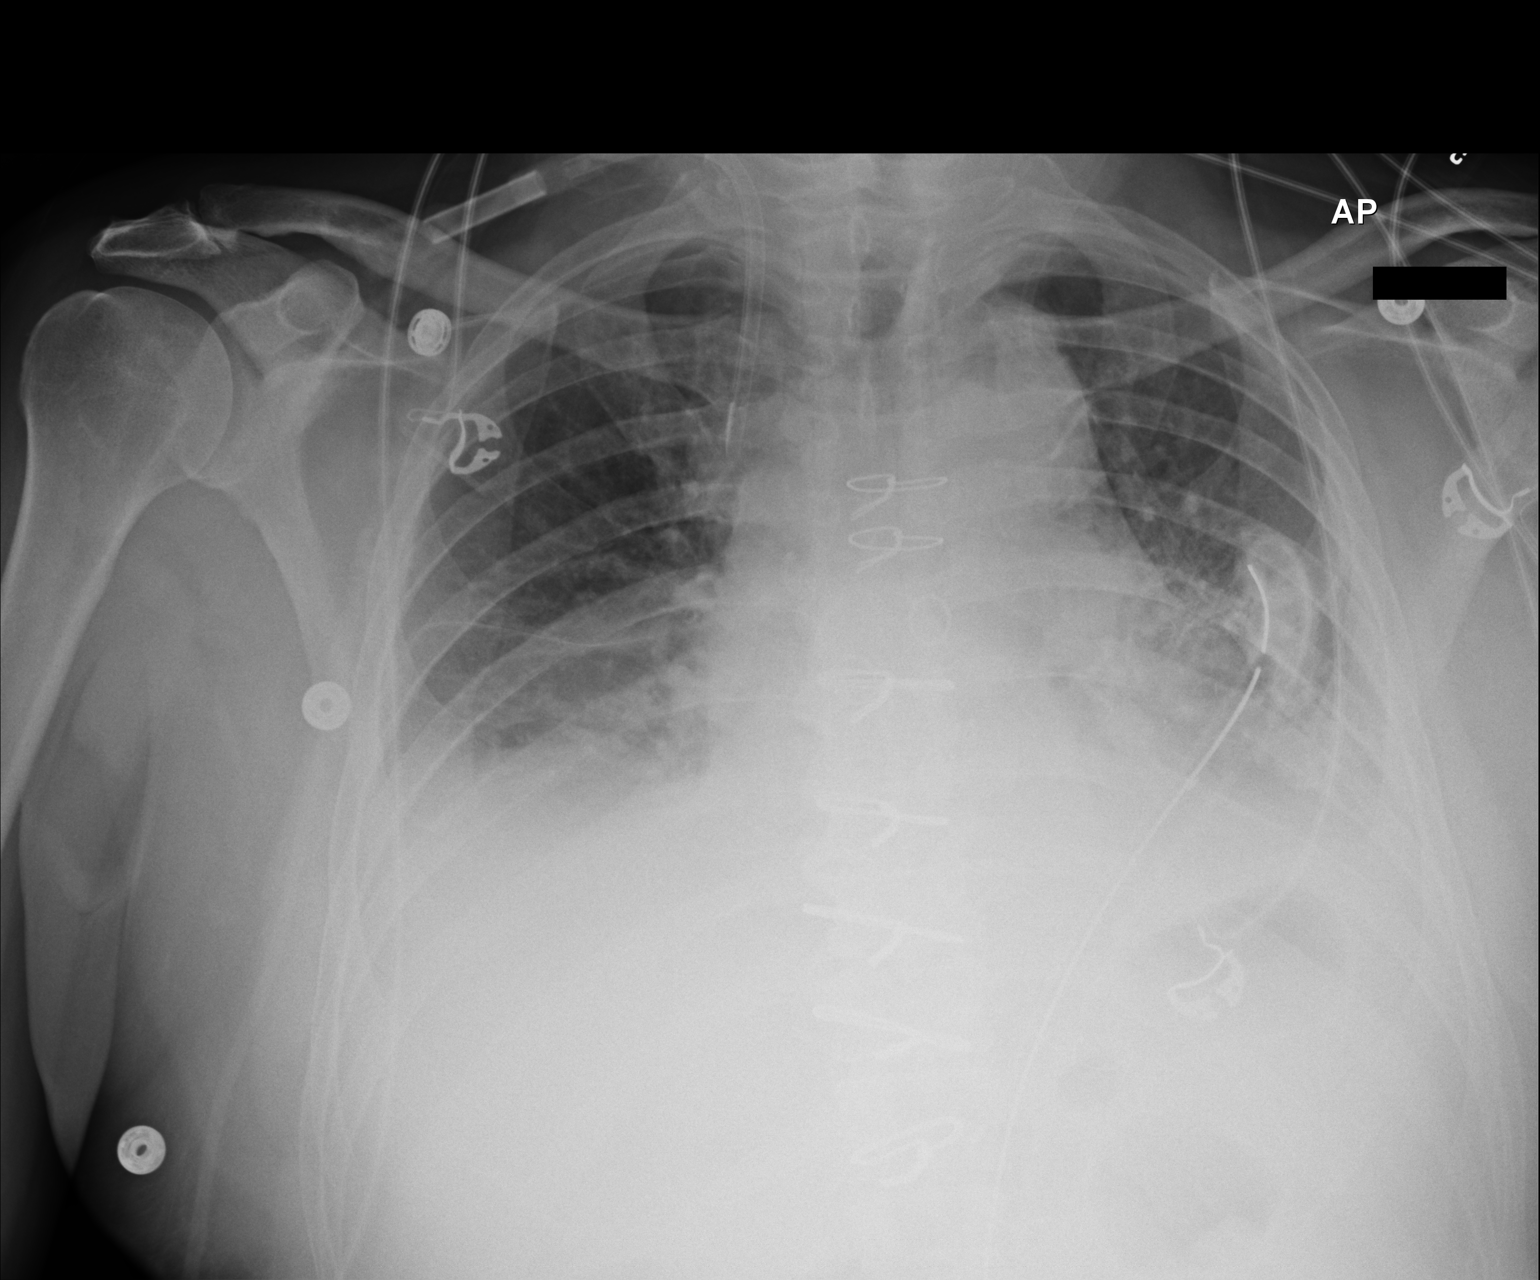

[1 of 1 positions shown; findings below may reference images not displayed]

FINDINGS: Left chest tube.  No pneumothorax is seen.

Interval removal of right IJ Swan-Ganz catheter. Right IJ venous
sheath in place.

Low lung volumes with patchy bilateral lower lobe opacities, likely
atelectasis. Associated small bilateral pleural effusions.

Mild cardiomegaly.  Postsurgical changes related to prior CABG.

Median sternotomy.
IMPRESSION: Left chest tube.  No pneumothorax is seen.

Low lung volumes with patchy bilateral lower lobe opacities, likely
atelectasis. Associated small bilateral pleural effusions.

## 2016-09-22 ENCOUNTER — Ambulatory Visit: Payer: BLUE CROSS/BLUE SHIELD | Admitting: Cardiology

## 2016-10-12 ENCOUNTER — Encounter (INDEPENDENT_AMBULATORY_CARE_PROVIDER_SITE_OTHER): Payer: Self-pay

## 2016-10-12 ENCOUNTER — Ambulatory Visit (INDEPENDENT_AMBULATORY_CARE_PROVIDER_SITE_OTHER): Payer: BLUE CROSS/BLUE SHIELD | Admitting: Cardiology

## 2016-10-12 ENCOUNTER — Encounter: Payer: Self-pay | Admitting: Cardiology

## 2016-10-12 VITALS — BP 150/90 | HR 80 | Ht 67.0 in | Wt 205.0 lb

## 2016-10-12 DIAGNOSIS — I252 Old myocardial infarction: Secondary | ICD-10-CM

## 2016-10-12 DIAGNOSIS — Z951 Presence of aortocoronary bypass graft: Secondary | ICD-10-CM | POA: Diagnosis not present

## 2016-10-12 DIAGNOSIS — I2583 Coronary atherosclerosis due to lipid rich plaque: Secondary | ICD-10-CM

## 2016-10-12 DIAGNOSIS — I2510 Atherosclerotic heart disease of native coronary artery without angina pectoris: Secondary | ICD-10-CM

## 2016-10-12 NOTE — Progress Notes (Signed)
Cardiology Office Note    Date:  10/12/2016   ID:  Thomas Thomas, DOB 02/13/1956, MRN [id number]  PCP:  Jannifer Rodneyhristy Hawks, FNP  Cardiologist:   Donato SchultzMark Briannon Boggio, MD   No chief complaint on file.   History of Present Illness:  Thomas Thomas is a 60 y.o. male with coronary artery disease status post CABG for severe three-vessel coronary artery disease in the setting of unstable angina, Dr. Maren BeachVanTrigt.  CABG 4 on 01/02/16.  Overall feeling well, walking 2-1/2 miles a day, working again, no signs of chest pain or anginal symptoms. EF normal.  Stopped postoperative amiodarone  Dull ache in back. Had this first since hospital. He works at [employer], [employer]. He's been very pleased with the way he feels.  Discussed sildenafil. Not on nitrate.   No CP, no SOB, no orthopnea. Using air cooker and enjoying this.    Past Medical History:  Diagnosis Date  . Anxiety   . Diabetes mellitus type II, non insulin dependent (HCC) 11/2015  . Hypertension   . NSTEMI (non-ST elevated myocardial infarction) (HCC) 12/23/2015    Past Surgical History:  Procedure Laterality Date  . CARDIAC CATHETERIZATION N/A 12/25/2015   Procedure: Left Heart Cath and Coronary Angiography;  Surgeon: Peter M SwazilandJordan, MD;  [facility];  Service: Cardiovascular;  Laterality: N/A;  . CORONARY ARTERY BYPASS GRAFT N/A 01/02/2016   Procedure: CORONARY ARTERY BYPASS GRAFTING (CABG)X 4 LIMA-LAD; SVG-RCA(ENDARDERECTOMY); SVG-DIAG; SVG-OM ENDOSCOPIC GREATER RIGHT SAPHENOUS VEIN HARVEST;  Surgeon: Kerin PernaPeter Van Trigt, MD; LIMA-LAD, SVG-D, SVG-OM, SVG-RCA-PDA    . HERNIA REPAIR    . KNEE SURGERY    . TEE WITHOUT CARDIOVERSION N/A 01/02/2016   Procedure: TRANSESOPHAGEAL ECHOCARDIOGRAM (TEE);  Surgeon: Kerin PernaPeter Van Trigt, MD;  [facility];  Service: Open Heart Surgery;  Laterality: N/A;  . US ECHOCARDIOGRAPHY  12/24/2015   EF 55-60%, grade 1 dd    Current Medications: Outpatient Medications Prior to Visit  Medication Sig  Dispense Refill  . aspirin EC 81 MG EC tablet Take 1 tablet (81 mg total) by mouth daily.    Marland Kitchen. atorvastatin (LIPITOR) 80 MG tablet Take 1 tablet (80 mg total) by mouth daily at 6 PM. 90 tablet 3  . clopidogrel (PLAVIX) 75 MG tablet Take 1 tablet (75 mg total) by mouth daily. 30 tablet 3  . lisinopril (PRINIVIL,ZESTRIL) 2.5 MG tablet Take 1 tablet (2.5 mg total) by mouth daily. 90 tablet 3  . amoxicillin-clavulanate (AUGMENTIN) 875-125 MG tablet Take 1 tablet by mouth 2 (two) times daily. 14 tablet 0   No facility-administered medications prior to visit.     Off amiodarone.    Allergies:   Patient has no known allergies.   Social History   Social History  . Marital status: Married    Spouse name: N/A  . Number of children: N/A  . Years of education: N/A   Social History Main Topics  . Smoking status: Former Smoker    Quit date: 06/06/2012  . Smokeless tobacco: None  . Alcohol use No  . Drug use: No  . Sexual activity: Not Asked   Other Topics Concern  . None   Social History Narrative  . None     Family History:  The patient's family history includes Cancer in his father; Heart disease in his mother.   ROS:   Please see the history of present illness.    ROS All other systems reviewed and are negative.   PHYSICAL EXAM:   VS:  BP (!) 150/90   Pulse 80   Ht 5\' 7"  (1.702 m)   Wt 205 lb (93 kg)   BMI 32.11 kg/m    GEN: Well nourished, well developed, in no acute distress  HEENT: normal  Neck: no JVD, carotid bruits, or masses Cardiac: RRR; no murmurs, rubs, or gallops,no edema Chest scar well healing Respiratory:  clear to auscultation bilaterally, normal work of breathing GI: soft, nontender, nondistended, + BS MS: no deformity or atrophy  Skin: warm and dry, no rash Neuro:  Alert and Oriented x 3, Strength and sensation are intact Psych: euthymic mood, full affect  Wt Readings from Last 3 Encounters:  10/12/16 205 lb (93 kg)  07/27/16 201 lb (91.2 kg)    05/22/16 197 lb 6.4 oz (89.5 kg)      Studies/Labs Reviewed:   EKG:  none  Recent Labs: 12/23/2015: B Natriuretic Peptide 77.0 12/24/2015: TSH 1.538 12/27/2015: ALT 15 01/03/2016: Magnesium 2.4 01/07/2016: BUN 18; Creatinine, Ser 0.97; Potassium 4.0; Sodium 136 01/08/2016: Hemoglobin 9.2; Platelets 345   Lipid Panel    Component Value Date/Time   CHOL 112 12/24/2015 0020   CHOL 140 07/03/2014 1021   TRIG 25 12/24/2015 0020   HDL 30 (L) 12/24/2015 0020   HDL 39 (L) 07/03/2014 1021   CHOLHDL 3.7 12/24/2015 0020   VLDL 5 12/24/2015 0020   LDLCALC 77 12/24/2015 0020   LDLCALC 92 07/03/2014 1021    Additional studies/ records that were reviewed today include:  Prior lab work, office notes, EKGs reviewed    ASSESSMENT:    1. S/P CABG x 5   2. Coronary artery disease due to lipid rich plaque   3. History of MI (myocardial infarction)      PLAN:  In order of problems listed above:  Coronary artery disease status post CABG  - Doing very well  - No anginal symptoms  - Continue secondary prevention with atorvastatin, beta blocker, aspirin  History of non-ST elevation myocardial infarction  - Plavix continue for 1 year post MI   Hyperlipidemia  -  statin use.  Continue to encourage weight loss   Medication Adjustments/Labs and Tests Ordered: Current medicines are reviewed at length with the patient today.  Concerns regarding medicines are outlined above.  Medication changes, Labs and Tests ordered today are listed in the Patient Instructions below. Patient Instructions  Medication Instructions:  The current medical regimen is effective;  continue present plan and medications.  Follow-Up: Follow up in 6 months with Dr. Anne FuSkains.  You will receive a letter in the mail 2 months before you are due.  Please call us when you receive this letter to schedule your follow up appointment.  If you need a refill on your cardiac medications before your next appointment, please  call your pharmacy.  [facility] you [facility] choosing [facility]!!        Signed, Donato SchultzMark Oyinkansola Truax, MD  10/12/2016 4:40 PM    [facility] [address], [place], [place]  [address] Phone: [phone]; Fax: [phone]

## 2016-10-12 NOTE — Patient Instructions (Signed)

## 2016-10-16 IMAGING — CR DG CHEST 2V
2 series · 2 of 2 positions shown · non-contrast
Comparison: Chest x-ray of January 07, 2016

CLINICAL DATA: Status post CABG on January 02, 2016, currently no
chest complaints.

EXAM:
CHEST  2 VIEW

[view not recorded (1 of 2)]
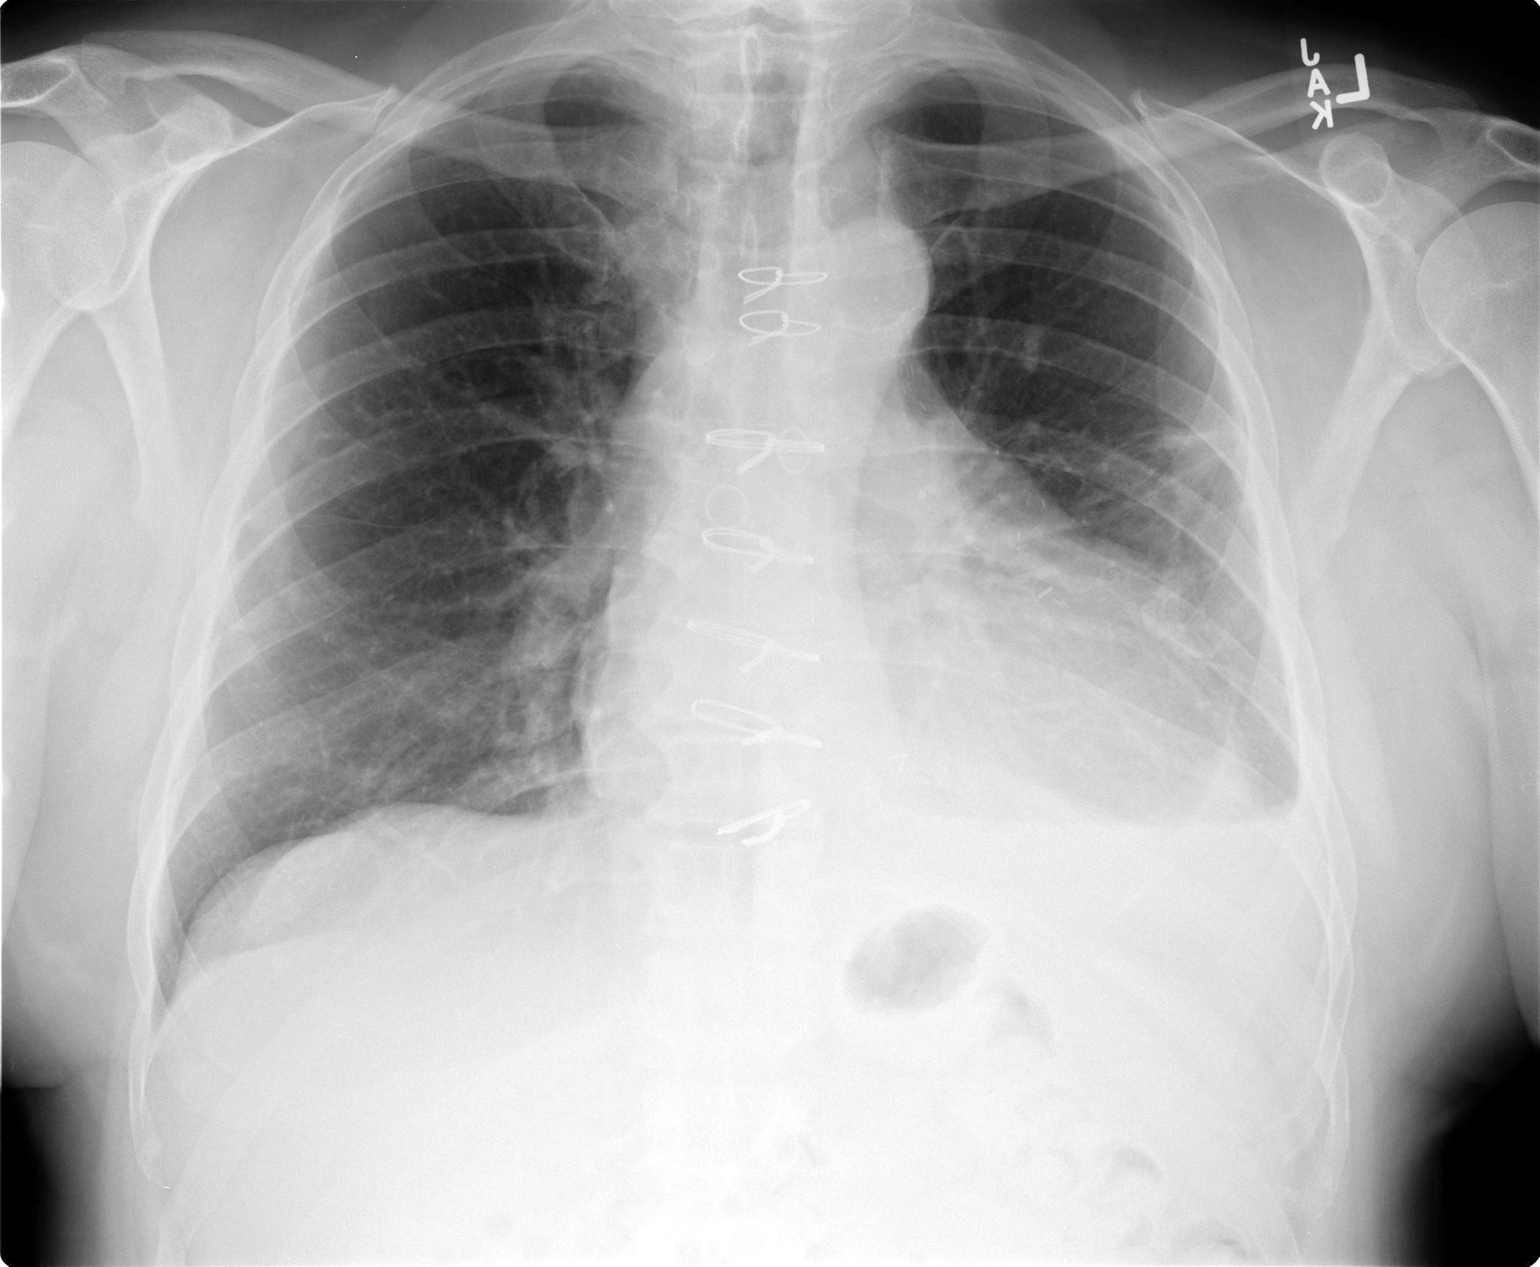

[view not recorded (2 of 2)]
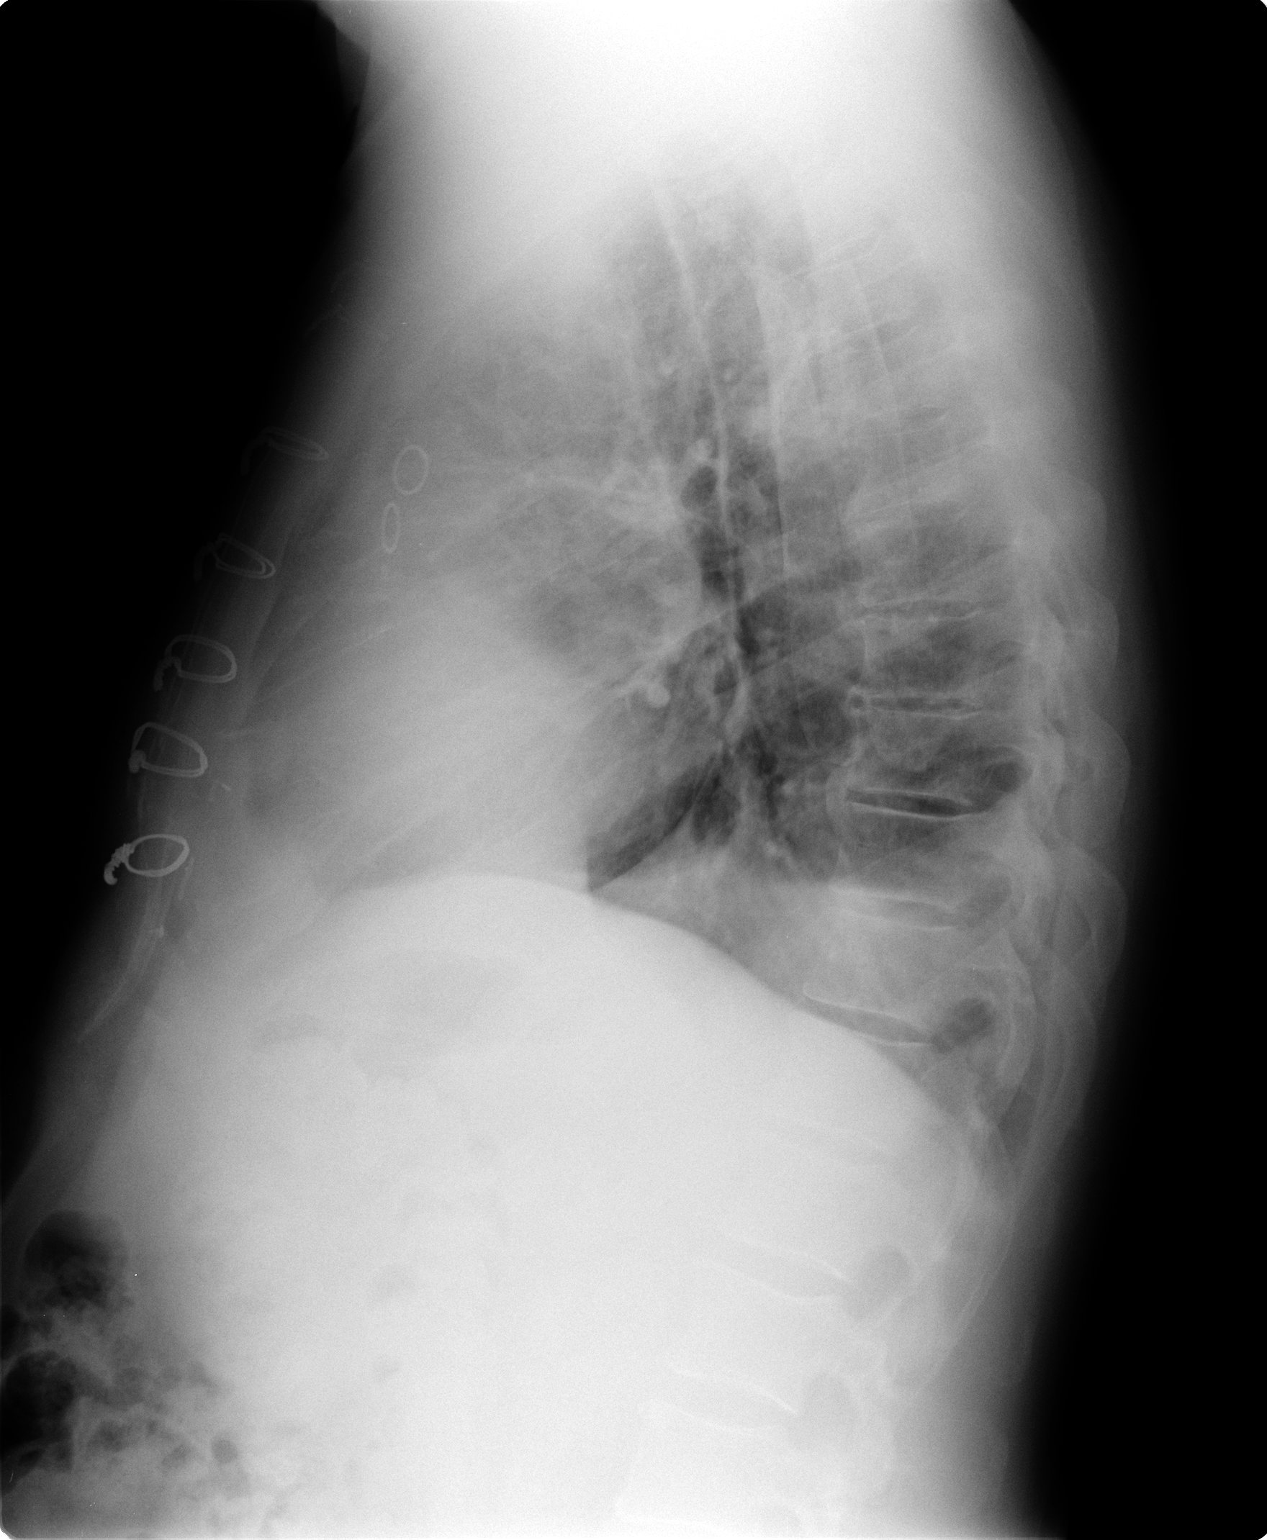

[2 of 2 positions shown; findings below may reference images not displayed]

FINDINGS: The lungs are adequately inflated. A small right pleural effusion
has resolved. The left pleural effusion has decreased in volume and
is now all small. Left basilar atelectasis has partially resolved.
The cardiac silhouette remains enlarged. The pulmonary vascularity
is not engorged. The sternal wires are intact.
IMPRESSION: Improved appearance of the chest as described with interval
resolution of the small right pleural effusion and decreased volume
of the left pleural effusion and decreased left basilar atelectasis.

## 2017-01-30 ENCOUNTER — Other Ambulatory Visit: Payer: Self-pay | Admitting: Cardiothoracic Surgery

## 2017-02-04 ENCOUNTER — Other Ambulatory Visit: Payer: Self-pay | Admitting: *Deleted

## 2017-02-04 MED ORDER — CLOPIDOGREL BISULFATE 75 MG PO TABS: 75.0000 mg | ORAL_TABLET | Freq: Every day | ORAL | 1 refills | Status: DC

## 2017-03-18 ENCOUNTER — Encounter: Payer: Self-pay | Admitting: Family Medicine

## 2017-03-18 ENCOUNTER — Ambulatory Visit (INDEPENDENT_AMBULATORY_CARE_PROVIDER_SITE_OTHER): Payer: BLUE CROSS/BLUE SHIELD | Admitting: Family Medicine

## 2017-03-18 VITALS — BP 138/82 | HR 72 | Temp 97.4°F | Ht 67.0 in | Wt 211.0 lb

## 2017-03-18 DIAGNOSIS — N529 Male erectile dysfunction, unspecified: Secondary | ICD-10-CM | POA: Diagnosis not present

## 2017-03-18 DIAGNOSIS — M62838 Other muscle spasm: Secondary | ICD-10-CM

## 2017-03-18 MED ORDER — SILDENAFIL CITRATE 20 MG PO TABS: 20.0000 mg | ORAL_TABLET | Freq: Every day | ORAL | 1 refills | Status: DC | PRN

## 2017-03-18 NOTE — Progress Notes (Signed)
BP 138/82   Pulse 72   Temp 97.4 F (36.3 C) (Oral)   Ht 5\' 7"  (1.702 m)   Wt 211 lb (95.7 kg)   BMI 33.05 kg/m    Subjective:    Patient ID: Thomas Thomas, male    DOB: 1956-07-24, 61 y.o.   MRN: [id number]  HPI: Patrich Heinze is a 61 y.o. male presenting on 03/18/2017 for Left side pain (earlier today,, has gone away; concerned because he had quadruple bypass November 2017) and Nausea   HPI Left-sided abdominal wall pain Patient has been having left-sided abdominal wall muscle pain that started around 6 AM this morning and was sporadically going on until about 10 AM. He did feel like he had some twitching in the muscle wall and he had 2 points one in the left upper and one in the left lower quadrant where he had his tenderness. He rated it as a 7 out of 10 when he had it. After 10 AM he has not had any further issues with it. He did have some nausea associated with it and took a walk and then he started feeling a lot better. He denies any constipation or diarrhea or blood in his stool or vomiting. He denies any pain radiating anywhere else. He denies any dysuria or hematuria or fevers or chills.  Patient would like a refill on his Viagra.  Relevant past medical, surgical, family and social history reviewed and updated as indicated. Interim medical history since our last visit reviewed. Allergies and medications reviewed and updated.  Review of Systems  Constitutional: Negative for chills and fever.  Respiratory: Negative for shortness of breath and wheezing.   Cardiovascular: Negative for chest pain and leg swelling.  Gastrointestinal: Positive for abdominal pain and nausea. Negative for blood in stool, constipation, diarrhea and vomiting.  Musculoskeletal: Negative for back pain and gait problem.  Skin: Negative for rash.  All other systems reviewed and are negative.   Per HPI unless specifically indicated above        Objective:    BP 138/82   Pulse 72   Temp 97.4 F  (36.3 C) (Oral)   Ht 5\' 7"  (1.702 m)   Wt 211 lb (95.7 kg)   BMI 33.05 kg/m   Wt Readings from Last 3 Encounters:  03/18/17 211 lb (95.7 kg)  10/12/16 205 lb (93 kg)  07/27/16 201 lb (91.2 kg)    Physical Exam  Constitutional: He is oriented to person, place, and time. He appears well-developed and well-nourished. No distress.  Eyes: Conjunctivae are normal. No scleral icterus.  Cardiovascular: Normal rate, regular rhythm, normal heart sounds and intact distal pulses.   No murmur heard. Pulmonary/Chest: Effort normal and breath sounds normal. No respiratory distress. He has no wheezes.  Abdominal: Soft. Bowel sounds are normal. He exhibits no distension. There is no tenderness (No tenderness on exam). There is no rebound and no guarding.  Musculoskeletal: Normal range of motion. He exhibits no edema.  Neurological: He is alert and oriented to person, place, and time. Coordination normal.  Skin: Skin is warm and dry. No rash noted. He is not diaphoretic.  Psychiatric: He has a normal mood and affect. His behavior is normal.  Nursing note and vitals reviewed.     Assessment & Plan:   Problem List Items Addressed This Visit    None    Visit Diagnoses    Spasm of abdominal muscles of left side    -  Primary  It doesn't return and this is likely what he had, if return then may consider diverticulitis is a possibility   Erectile dysfunction, unspecified erectile dysfunction type       Patient would like a refill on his Viagra, will send a form.       Follow up plan: Return if symptoms worsen or fail to improve.  Counseling provided for all of the vaccine components No orders of the defined types were placed in this encounter.   Arville CareJoshua Folashade Gamboa, MD [facility] 03/18/2017, 5:53 PM

## 2017-07-24 ENCOUNTER — Other Ambulatory Visit: Payer: Self-pay | Admitting: Cardiology

## 2017-07-30 ENCOUNTER — Encounter: Payer: Self-pay | Admitting: Family Medicine

## 2017-07-30 ENCOUNTER — Ambulatory Visit (INDEPENDENT_AMBULATORY_CARE_PROVIDER_SITE_OTHER): Payer: BLUE CROSS/BLUE SHIELD | Admitting: Family Medicine

## 2017-07-30 VITALS — BP 147/78 | HR 76 | Temp 97.3°F | Ht 67.0 in | Wt 211.0 lb

## 2017-07-30 DIAGNOSIS — S300XXA Contusion of lower back and pelvis, initial encounter: Secondary | ICD-10-CM | POA: Diagnosis not present

## 2017-07-30 DIAGNOSIS — L308 Other specified dermatitis: Secondary | ICD-10-CM

## 2017-07-30 DIAGNOSIS — I2583 Coronary atherosclerosis due to lipid rich plaque: Secondary | ICD-10-CM | POA: Diagnosis not present

## 2017-07-30 DIAGNOSIS — I2510 Atherosclerotic heart disease of native coronary artery without angina pectoris: Secondary | ICD-10-CM

## 2017-07-30 MED ORDER — PREDNISONE 10 MG PO TABS: ORAL_TABLET | ORAL | 0 refills | Status: DC

## 2017-07-30 MED ORDER — SILDENAFIL CITRATE 20 MG PO TABS: ORAL_TABLET | ORAL | 1 refills | Status: DC

## 2017-07-30 NOTE — Progress Notes (Signed)
Chief Complaint  Patient presents with  . Fall    pt here today after falling yesterday. His feet went out from under him and he fell on his cell phone leaving a rather large bruise and "knot" on the left buttocks. He would like the knot checked out.    HPI  Patient presents today for A knot on the buttocks at the place where he fell. It is not painful. However he takes blood thinners and he is concerned about internal bleeding and blood clotting. He denies shortness of breath. No leg swelling. Patient also states that he would like to have refills on his Revatio which he uses for erectile dysfunction. It does not cause any side effects. It works well for him. Additionally he has an eruption on the hands he has seen various doctors and dermatologists and has had various treatments that have not worked. An older doctor told him just use Vaseline and that seemed to work fairly well in the past. He would like some treatment for that. Someone told him that steroids might help. Patient also relates history of heart attack and placement of stents. Denies chest pain at this time. No shortness of breath. Gives detailed history of chest pain and having cardiac arrest on the cath table. This is why he uses the blood thinner, Plavix. He is concerned that this blood thinner might be contributing to his bruising and to the nodule.  PMH: Smoking status noted ROS: Review of Systems  Constitutional: Negative for chills, diaphoresis and fever.  HENT: Negative.   Respiratory: Negative for cough.   Cardiovascular: Negative for chest pain.  Genitourinary: Negative for dysuria and testicular pain.  Musculoskeletal: Positive for myalgias. Negative for arthralgias.  Skin: Positive for rash.  Neurological: Negative for weakness and headaches.     Objective: BP (!) 147/78   Pulse 76   Temp (!) 97.3 F (36.3 C) (Oral)   Ht  (1.702 m)   Wt 211 lb (95.7 kg)   BMI 33.05 kg/m  Gen: NAD, alert, cooperative  with exam HEENT: NCAT, EOMI, PERRL CV: RRR, good S1/S2, no murmur Resp: CTABL, no wheezes, non-labored Ext: No edema, warm. Marked bruising noted at the right buttock. There is some fullness at the area of the patient describes as a knot. Both hands have fairly thick plaque at the base of the palms over the thenar eminence. Neuro: Alert and oriented, No gross deficits  Assessment and plan:  1. Contusion, buttock, initial encounter   2. Other eczema   3. Coronary artery disease due to lipid rich plaque   CAD appears stable at this time based on exam and history  Meds ordered this encounter  Medications  . predniSONE (DELTASONE) 10 MG tablet    Sig: Take 5 tabs QD x 3 days, then 4 tabs QD x 3 days, then 3 tabs QD x 3 days, then 2 tabs QD x 3 days, then 1 tab QD x 3 days    Dispense:  45 tablet    Refill:  0  . sildenafil (REVATIO) 20 MG tablet    Sig: Take 2-5 tabs PRN prior to sexual activity    Dispense:  50 tablet    Refill:  1    Apply ice frequently to the affected area. Rest and monitor for enlargement of the nodule or bruising as well as increase in pain.  Follow up 6 months and when necessary Mechele Claude, MD

## 2017-08-16 ENCOUNTER — Other Ambulatory Visit: Payer: Self-pay | Admitting: Cardiology

## 2017-08-18 ENCOUNTER — Ambulatory Visit (INDEPENDENT_AMBULATORY_CARE_PROVIDER_SITE_OTHER): Payer: BLUE CROSS/BLUE SHIELD | Admitting: Cardiology

## 2017-08-18 ENCOUNTER — Encounter: Payer: Self-pay | Admitting: Cardiology

## 2017-08-18 VITALS — BP 138/76 | HR 84 | Resp 16 | Ht 67.0 in | Wt 211.1 lb

## 2017-08-18 DIAGNOSIS — I2510 Atherosclerotic heart disease of native coronary artery without angina pectoris: Secondary | ICD-10-CM

## 2017-08-18 DIAGNOSIS — Z79899 Other long term (current) drug therapy: Secondary | ICD-10-CM

## 2017-08-18 DIAGNOSIS — E785 Hyperlipidemia, unspecified: Secondary | ICD-10-CM

## 2017-08-18 DIAGNOSIS — I2583 Coronary atherosclerosis due to lipid rich plaque: Secondary | ICD-10-CM | POA: Diagnosis not present

## 2017-08-18 NOTE — Patient Instructions (Signed)
Medication Instructions:  You may stop your Plavix. Continue all other medications as listed.  Labwork: Please have blood work today (CMP, Lipid).  Follow-Up: Follow up in 1 year with Dr. Anne FuSkains.  You will receive a letter in the mail 2 months before you are due.  Please call us when you receive this letter to schedule your follow up appointment.  If you need a refill on your cardiac medications before your next appointment, please call your pharmacy.  Thank you for choosing [facility]!!

## 2017-08-18 NOTE — Progress Notes (Signed)
Cardiology Office Note    Date:  08/18/2017   ID:  Thomas CollinDennis Weinel, DOB 05/17/1956, MRN [id number]  PCP:  [name], [name], FNP  Cardiologist:   Donato SchultzMark Paislee Szatkowski, MD     History of Present Illness:  Thomas Thomas is a 61 y.o. male with coronary artery disease status post CABG for severe three-vessel coronary artery disease in the setting of unstable angina, Dr. Maren BeachVanTrigt.  CABG 4 on 01/02/16.  Overall feeling well, walking 2-1/2 miles a day, working again, no signs of chest pain or anginal symptoms. EF normal.  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 He's been very pleased with the way he feels.  Discussed sildenafil. Not on nitrate.   Had one episode one morning. Sprite, sat down, went away. 10 min. Working hard.  Using air cooker and enjoying this.   Splits opened up on hand and feet.  Painful.  Just recently finished a 14-day course of prednisone.   Past Medical History:  Diagnosis Date  . Anxiety   . Diabetes mellitus type II, non insulin dependent (HCC) 11/2015  . Hypertension   . NSTEMI (non-ST elevated myocardial infarction) (HCC) 12/23/2015    Past Surgical History:  Procedure Laterality Date  . CARDIAC CATHETERIZATION N/A 12/25/2015   Procedure: Left Heart Cath and Coronary Angiography;  Surgeon: Peter M SwazilandJordan, MD;  [facility];  Service: Cardiovascular;  Laterality: N/A;  . CORONARY ARTERY BYPASS GRAFT N/A 01/02/2016   Procedure: CORONARY ARTERY BYPASS GRAFTING (CABG)X 4 LIMA-LAD; SVG-RCA(ENDARDERECTOMY); SVG-DIAG; SVG-OM ENDOSCOPIC GREATER RIGHT SAPHENOUS VEIN HARVEST;  Surgeon: Kerin PernaPeter Van Trigt, MD; LIMA-LAD, SVG-D, SVG-OM, SVG-RCA-PDA    . HERNIA REPAIR    . KNEE SURGERY    . TEE WITHOUT CARDIOVERSION N/A 01/02/2016   Procedure: TRANSESOPHAGEAL ECHOCARDIOGRAM (TEE);  Surgeon: Kerin PernaPeter Van Trigt, MD;  [facility];  Service: Open Heart Surgery;  Laterality: N/A;  . US ECHOCARDIOGRAPHY  12/24/2015   EF 55-60%, grade 1 dd     Current Medications: Outpatient Medications Prior to Visit  Medication Sig Dispense Refill  . aspirin EC 81 MG EC tablet Take 1 tablet (81 mg total) by mouth daily.    . carvedilol (COREG) 6.25 MG tablet TAKE ONE TABLET BY MOUTH TWICE DAILY WITH  A  MEAL 60 tablet 0  . lisinopril (PRINIVIL,ZESTRIL) 2.5 MG tablet Take 1 tablet (2.5 mg total) by mouth daily. Please keep upcoming appt for future refills. Thanks 90 tablet 0  . predniSONE (DELTASONE) 10 MG tablet Take 5 tabs QD x 3 days, then 4 tabs QD x 3 days, then 3 tabs QD x 3 days, then 2 tabs QD x 3 days, then 1 tab QD x 3 days 45 tablet 0  . sildenafil (REVATIO) 20 MG tablet Take 2-5 tabs PRN prior to sexual activity 50 tablet 1  . clopidogrel (PLAVIX) 75 MG tablet Take 1 tablet (75 mg total) by mouth daily. 90 tablet 1   No facility-administered medications prior to visit.     Off amiodarone.    Allergies:   Patient has no known allergies.   Social History   Social History  . Marital status: Married    Spouse name: N/A  . Number of children: N/A  . Years of education: N/A   Social History Main Topics  . Smoking status: Former Smoker    Quit date: 06/06/2012  . Smokeless tobacco: Never Used  . Alcohol use No  . Drug use: No  . Sexual activity:  Not Asked   Other Topics Concern  . None   Social History Narrative  . None     Family History:  The patient's family history includes Cancer in his father; Heart disease in his mother.   ROS:   Please see the history of present illness.  Denies syncope, bleeding, orthopnea, PND ROS All other systems reviewed and are negative.   PHYSICAL EXAM:   VS:  BP 138/76   Pulse 84   Resp 16   Ht 5\' 7"  (1.702 m)   Wt 211 lb 2 oz (95.8 kg)   SpO2 94%   BMI 33.07 kg/m    GEN: Well nourished, well developed, in no acute distress  HEENT: normal  Neck: no JVD, carotid bruits, or masses Cardiac: RRR; no murmurs, rubs, or gallops,no edema Chest scar well healing Respiratory:   clear to auscultation bilaterally, normal work of breathing GI: soft, nontender, nondistended, + BS MS: no deformity or atrophy  Skin: warm and dry, no rash Neuro:  Alert and Oriented x 3, Strength and sensation are intact Psych: euthymic mood, full affect  Wt Readings from Last 3 Encounters:  08/18/17 211 lb 2 oz (95.8 kg)  07/30/17 211 lb (95.7 kg)  03/18/17 211 lb (95.7 kg)      Studies/Labs Reviewed:   EKG: August 18, 2017 normal sinus rhythm heart rate 78 bpm with poor R wave progression old anterior infarct pattern.  Personally reviewed  Recent Labs: No results found for requested labs within last 8760 hours.   Lipid Panel    Component Value Date/Time   CHOL 112 12/24/2015 0020   CHOL 140 07/03/2014 1021   TRIG 25 12/24/2015 0020   HDL 30 (L) 12/24/2015 0020   HDL 39 (L) 07/03/2014 1021   CHOLHDL 3.7 12/24/2015 0020   VLDL 5 12/24/2015 0020   LDLCALC 77 12/24/2015 0020   LDLCALC 92 07/03/2014 1021    Additional studies/ records that were reviewed today include:  Prior lab work, office notes, EKGs reviewed    ASSESSMENT:    1. Atherosclerosis of native coronary artery of native heart without angina pectoris   2. Coronary atherosclerosis due to lipid rich plaque   3. Hyperlipidemia, unspecified hyperlipidemia type   4. Long-term use of high-risk medication      PLAN:  In order of problems listed above:  Coronary artery disease status post CABG  - Doing very well  - No significant anginal symptoms  - Continue secondary prevention with atorvastatin, beta blocker, aspirin.  We are going to stop his Plavix.  It is been greater than 1 year since non-ST elevation myocardial infarction.  History of non-ST elevation myocardial infarction  - Plavix continue for 1 year post MI.  We will go ahead and stop.  Hyperlipidemia  -  statin use.  I will check lipid profile.  Continue to encourage weight loss   Medication Adjustments/Labs and Tests Ordered: Current  medicines are reviewed at length with the patient today.  Concerns regarding medicines are outlined above.  Medication changes, Labs and Tests ordered today are listed in the Patient Instructions below. Patient Instructions  Medication Instructions:  You may stop your Plavix. Continue all other medications as listed.  Labwork: Please have blood work today (CMP, Lipid).  Follow-Up: Follow up in 1 year with Dr. Anne Fu.  You will receive a letter in the mail 2 months before you are due.  Please call us when you receive this letter to schedule your follow up appointment.  If you need a refill on your cardiac medications before your next appointment, please call your pharmacy.  Thank you for choosing [facility]!!        Signed, Donato Schultz, MD  08/18/2017 5:14 PM    [facility] [address] [place], [place], [place]  [address] Phone: [phone]; Fax: [phone]

## 2017-08-19 LAB — COMPREHENSIVE METABOLIC PANEL
A/G RATIO: 1.5 (ref 1.2–2.2)
ALT: 23 IU/L (ref 0–44)
AST: 14 IU/L (ref 0–40)
Albumin: 4.4 g/dL (ref 3.6–4.8)
Alkaline Phosphatase: 67 IU/L (ref 39–117)
BUN/Creatinine Ratio: 15 (ref 10–24)
BUN: 16 mg/dL (ref 8–27)
Bilirubin Total: 0.3 mg/dL (ref 0.0–1.2)
CALCIUM: 9.7 mg/dL (ref 8.6–10.2)
CO2: 25 mmol/L (ref 20–29)
Chloride: 98 mmol/L (ref 96–106)
Creatinine, Ser: 1.05 mg/dL (ref 0.76–1.27)
GFR, EST AFRICAN AMERICAN: 88 mL/min/{1.73_m2} (ref >59)
GFR, EST NON AFRICAN AMERICAN: 76 mL/min/{1.73_m2} (ref >59)
GLOBULIN, TOTAL: 2.9 g/dL (ref 1.5–4.5)
Glucose: 103 mg/dL — ABNORMAL HIGH (ref 65–99)
POTASSIUM: 5 mmol/L (ref 3.5–5.2)
Sodium: 137 mmol/L (ref 134–144)
Total Protein: 7.3 g/dL (ref 6.0–8.5)

## 2017-08-19 LAB — LIPID PANEL
CHOL/HDL RATIO: 4.8 ratio (ref 0.0–5.0)
Cholesterol, Total: 188 mg/dL (ref 100–199)
HDL: 39 mg/dL — AB (ref >39)
LDL Calculated: 138 mg/dL — ABNORMAL HIGH (ref 0–99)
Triglycerides: 55 mg/dL (ref 0–149)
VLDL Cholesterol Cal: 11 mg/dL (ref 5–40)

## 2017-08-20 ENCOUNTER — Telehealth: Payer: Self-pay | Admitting: Cardiology

## 2017-08-20 MED ORDER — ATORVASTATIN CALCIUM 80 MG PO TABS: 80.0000 mg | ORAL_TABLET | Freq: Every day | ORAL | 3 refills | Status: DC

## 2017-08-20 NOTE — Telephone Encounter (Signed)
See lab results for documentation.  Rx sent into pharmacy.

## 2017-08-20 NOTE — Telephone Encounter (Signed)
New message     please call patient back regarding lab results.

## 2017-08-30 ENCOUNTER — Other Ambulatory Visit: Payer: Self-pay | Admitting: Cardiology

## 2017-08-31 NOTE — Telephone Encounter (Signed)
AVS Reports   Date/Time Report Action User  08/18/2017 3:05 PM After Visit Summary Printed Sharin GraveFleming, Pamela J, RN  Patient Instructions   Medication Instructions:  You may stop your Plavix. Continue all other medications as listed.

## 2017-09-09 ENCOUNTER — Other Ambulatory Visit: Payer: Self-pay | Admitting: Cardiology

## 2017-09-29 ENCOUNTER — Other Ambulatory Visit: Payer: Self-pay | Admitting: Cardiology

## 2017-10-12 ENCOUNTER — Encounter: Payer: Self-pay | Admitting: Family Medicine

## 2017-10-12 ENCOUNTER — Ambulatory Visit (INDEPENDENT_AMBULATORY_CARE_PROVIDER_SITE_OTHER): Payer: BLUE CROSS/BLUE SHIELD | Admitting: Family Medicine

## 2017-10-12 VITALS — BP 154/82 | HR 75 | Temp 97.5°F | Ht 67.0 in | Wt 215.0 lb

## 2017-10-12 DIAGNOSIS — L309 Dermatitis, unspecified: Secondary | ICD-10-CM | POA: Diagnosis not present

## 2017-10-12 MED ORDER — METHYLPREDNISOLONE ACETATE 80 MG/ML IJ SUSP
80.0000 mg | Freq: Once | INTRAMUSCULAR | Status: AC
  Administered 2017-10-12: 80 mg via INTRAMUSCULAR

## 2017-10-12 MED ORDER — CETIRIZINE HCL 10 MG PO TABS: 10.0000 mg | ORAL_TABLET | Freq: Every day | ORAL | 11 refills | Status: DC

## 2017-10-12 MED ORDER — PREDNISONE 10 MG PO TABS: ORAL_TABLET | ORAL | 0 refills | Status: DC

## 2017-10-12 NOTE — Patient Instructions (Addendum)
You are given a dose of steroids IM today.  Start the prednisone tomorrow.  You may start the cetirizine today.  Follow-up with me in 2 weeks for recheck.  Basic Skin Care Your skin plays an important role in keeping the entire body healthy.  Below are some tips on how to try and maximize skin health from the outside in.  1) Bathe in mildly warm water every 1 to 3 days, followed by light drying and an application of a thick moisturizer cream or ointment, preferably one that comes in a tub. a. Fragrance free moisturizing bars or body washes are preferred such as Purpose, Cetaphil, Dove sensitive skin, Aveeno, Ar[employer]California Baby or Vanicream products. b. Use a fragrance free cream or ointment, not a lotion, such as plain petroleum jelly or Vaseline ointment, Aquaphor, Vanicream, Eucerin cream or a generic version, CeraVe Cream, Cetaphil Restoraderm, Aveeno Eczema Therapy and [employer] Baby Calming, among others. c. Children with very dry skin often need to put on these creams two, three or four times a day.  As much as possible, use these creams enough to keep the skin from looking dry. d. Consider using fragrance free/dye free detergent, such as Arm and Hammer for sensitive skin, Tide Free or All Free.   2) If I am prescribing a medication to go on the skin, the medicine goes on first to the areas that need it, followed by a thick cream as above to the entire body.  3) Wynelle LinkSun is a major cause of damage to the skin. a. I recommend sun protection for all of my patients. I prefer physical barriers such as hats with wide brims that cover the ears, long sleeve clothing with SPF protection including rash guards for swimming. These can be found seasonally at outdoor clothing companies, [employer] and [employer] and online at Liz Claibornewww.coolibar.com, www.uvskinz.com and BrideEmporium.nlwww.sunprecautions.com. Avoid peak sun between the hours of 10am to 3pm to minimize sun exposure.  b. I recommend sunscreen for all of my patients older than 796  months of age when in the sun, preferably with broad spectrum coverage and SPF 30 or higher.  i. For children, I recommend sunscreens that only contain titanium dioxide and/or zinc oxide in the active ingredients. These do not burn the eyes and appear to be safer than chemical sunscreens. These sunscreens include zinc oxide paste found in the diaper section, Vanicream Broad Spectrum 50+, Aveeno Natural Mineral Protection, Neutrogena Pure and Free Baby, Johnson and MotorolaJohnson Baby Daily face and body lotion, CitigroupCalifornia Baby products, among others. ii. There is no such thing as waterproof sunscreen. All sunscreens should be reapplied after 60-80 minutes of wear.  iii. Spray on sunscreens often use chemical sunscreens which do protect against the sun. However, these can be difficult to apply correctly, especially if wind is present, and can be more likely to irritate the skin.  Long term effects of chemical sunscreens are also not fully known.

## 2017-10-12 NOTE — Progress Notes (Signed)
Subjective: NW:GNFACC:rash on hands PCP: Thomas Thomas OZH:YQMVHQHPI:Thomas Thomas is a 61 y.o. male presenting to clinic today for:  1. Rash Patient notes a 1228-month history of bilateral hand and foot rash.  He notes that he had a very similar episode in 2015 for which she saw many specialists, including dermatology for.  He notes that several biopsies and treatments were done, none of which improved symptoms except for oral steroids.  He was never given a definitive diagnosis.  He notes that symptoms have completely resolved since 2015 but came back at 2 months ago.  He saw 1 of his primary care providers for this who prescribed a long taper of prednisone.  He notes improvement in the rash with the prednisone but notes that it never completely resolved and now has worsened since discontinuing medication.  He denies rash affecting any other parts of his body except for distal extremities.  He does report some cracking of the skin with associated clear exudate.  No purulence, fevers, nausea, vomiting, malaise, swelling.  Rash is itchy and once it cracks does become painful.  He is not on any antihistamines at this time.   ROS: Per HPI  No Known Allergies Past Medical History:  Diagnosis Date  . Anxiety   . Diabetes mellitus type II, non insulin dependent (HCC) 11/2015  . Hypertension   . NSTEMI (non-ST elevated myocardial infarction) (HCC) 12/23/2015    Current Outpatient Medications:  .  aspirin EC 81 MG EC tablet, Take 1 tablet (81 mg total) by mouth daily., Disp: , Rfl:  .  atorvastatin (LIPITOR) 80 MG tablet, Take 1 tablet (80 mg total) by mouth daily., Disp: 90 tablet, Rfl: 3 .  carvedilol (COREG) 6.25 MG tablet, TAKE 1 TABLET BY MOUTH TWICE DAILY WITH A MEAL (PLEASE SCHEDULE FOLLOW UP APPOINTMENT WITH DR Anne FuSKAINS BEFORE FURTHER REFILLS), Disp: 180 tablet, Rfl: 2 .  lisinopril (PRINIVIL,ZESTRIL) 2.5 MG tablet, Take 1 tablet (2.5 mg total) by mouth daily. Please keep upcoming appt for future  refills. Thanks, Disp: 90 tablet, Rfl: 0 .  sildenafil (REVATIO) 20 MG tablet, Take 2-5 tabs PRN prior to sexual activity, Disp: 50 tablet, Rfl: 1 Social History   Socioeconomic History  . Marital status: Married    Spouse name: Not on file  . Number of children: Not on file  . Years of education: Not on file  . Highest education level: Not on file  Social Needs  . Financial resource strain: Not on file  . Food insecurity - worry: Not on file  . Food insecurity - inability: Not on file  . Transportation needs - medical: Not on file  . Transportation needs - non-medical: Not on file  Occupational History  . Not on file  Tobacco Use  . Smoking status: Former Smoker    Last attempt to quit: 06/06/2012    Years since quitting: 5.3  . Smokeless tobacco: Never Used  Substance and Sexual Activity  . Alcohol use: No  . Drug use: No  . Sexual activity: Not on file  Other Topics Concern  . Not on file  Social History Narrative  . Not on file   Family History  Problem Relation Age of Onset  . Heart disease Mother   . Cancer Father        stomach    Objective: Office vital signs reviewed. BP (!) 154/82   Pulse 75   Temp (!) 97.5 F (36.4 C) (Oral)   Ht 5\' 7"  (  1.702 m)   Wt 215 lb (97.5 kg)   BMI 33.67 kg/m   Physical Examination:  General: Awake, alert, well nourished, No acute distress Skin: Bilateral hands with dry, cracked skin on the palmar aspect.  Serous fluid appreciated in some areas.  No bleeding, purulence, palpable fluctuance, induration seen.  Very minimal tenderness to palpation of the palms.  Bilateral feet with similar presentation.  He does have some extension of the blanching, erythematous, maculopapular rash along the medial aspect of the right lower extremity to just below the calf.  There is no tenderness to palpation of this area but there is associated right lower extremity edema, which patient reports is chronic since having graft.  Clear exudate  appreciated from this area as well.  No purulence, fluctuance or induration.  Assessment/ Plan: 61 y.o. male   1. Dermatitis Symptoms certainly consistent with some type of dermatitis.  He seems totally refractory to topical steroids in the past.  He is status post a long taper of oral steroids.  I did review with him that persistent use of steroids would not be ideal for his symptoms.  I did agree to give him a shorter taper and I asked that he follow-up in the next 2 weeks for reevaluation.  I have placed him on an oral antihistamine.  I reviewed proper skin care regimens at home which he seems to be for the most part doing.  If he has persistent symptoms, I did discuss with him that the next step would be referral to dermatology preferably at an academic center.  Signs and symptoms of infection were reviewed with the patient.  He voiced good understanding and will follow-up in 2 weeks. - methylPREDNISolone acetate (DEPO-MEDROL) injection 80 mg   Meds ordered this encounter  Medications  . predniSONE (DELTASONE) 10 MG tablet    Sig: Take 60mg  by mouth day 1-2, 50mg  day 3-4, 40mg  day 5-6, 30mg  day 7-8, 20mg  day 9-10, 10mg  day 11-12.  Then stop.    Dispense:  42 tablet    Refill:  0  . cetirizine (ZYRTEC) 10 MG tablet    Sig: Take 1 tablet (10 mg total) by mouth daily.    Dispense:  30 tablet    Refill:  11  . methylPREDNISolone acetate (DEPO-MEDROL) injection 80 mg     Raliegh IpAshly M Gottschalk, DO [facility] [phone]

## 2017-11-02 ENCOUNTER — Ambulatory Visit: Payer: BLUE CROSS/BLUE SHIELD | Admitting: Cardiology

## 2017-11-03 ENCOUNTER — Encounter: Payer: Self-pay | Admitting: Family

## 2017-11-03 ENCOUNTER — Ambulatory Visit: Payer: BLUE CROSS/BLUE SHIELD | Admitting: Family

## 2017-11-03 VITALS — BP 127/80 | HR 85 | Temp 97.9°F | Ht 67.0 in | Wt 216.0 lb

## 2017-11-03 DIAGNOSIS — L309 Dermatitis, unspecified: Secondary | ICD-10-CM

## 2017-11-03 MED ORDER — TRIAMCINOLONE ACETONIDE 0.5 % EX CREA: 1.0000 "application " | TOPICAL_CREAM | Freq: Three times a day (TID) | CUTANEOUS | 0 refills | Status: DC

## 2017-11-03 NOTE — Progress Notes (Signed)
   Subjective:    Patient ID: Thomas Thomas, male    DOB: 08/18/1956, 62 y.o.   MRN: [id number]  HPI PT presents to the office today to recheck dermatitis. PT was seen on 10/12/17 and was given prednisone, zyrtec, and Depo-Medrol injection. PT states the rash on his hands, feet, and ankle has greatly improved.   Pt states he has put Vaseline on bilateral feet, ankle, and hands. PT states the redness on the medial right ankle gets worse after he stands all day.    Review of Systems  Skin: Positive for rash.  All other systems reviewed and are negative.      Objective:   Physical Exam  Constitutional: He is oriented to person, place, and time. He appears well-developed and well-nourished. No distress.  HENT:  Head: Normocephalic.  Eyes: Pupils are equal, round, and reactive to light. Right eye exhibits no discharge. Left eye exhibits no discharge.  Neck: Normal range of motion. Neck supple. No thyromegaly present.  Cardiovascular: Normal rate, regular rhythm, normal heart sounds and intact distal pulses.  No murmur heard. Pulmonary/Chest: Effort normal and breath sounds normal. No respiratory distress. He has no wheezes.  Abdominal: Soft. Bowel sounds are normal. He exhibits no distension. There is no tenderness.  Musculoskeletal: Normal range of motion. He exhibits no edema or tenderness.  Neurological: He is alert and oriented to person, place, and time.  Skin: Skin is warm and dry. Rash noted. No erythema.  Dermitis and cracking of hands and feet, erythemas of medial ankle   Psychiatric: He has a normal mood and affect. His behavior is normal. Judgment and thought content normal.  Vitals reviewed.   BP 127/80   Pulse 85   Temp 97.9 F (36.6 C) (Oral)   Ht 5\' 7"  (1.702 m)   Wt 216 lb (98 kg)   BMI 33.83 kg/m      Assessment & Plan:  1. Dermatitis Keep clean and dry Avoid chemicals- wear gloves while working and handling the oils Mild soap-Dial Do not scratch Avoid  cold weather RTO prn - triamcinolone cream (KENALOG) 0.5 %; Apply 1 application topically 3 (three) times daily.  Dispense: 30 g; Refill: 0   Jannifer Rodneyhristy Hawks, FNP

## 2017-11-03 NOTE — Patient Instructions (Signed)

## 2017-11-15 ENCOUNTER — Telehealth: Payer: Self-pay | Admitting: Family

## 2017-11-15 DIAGNOSIS — L309 Dermatitis, unspecified: Secondary | ICD-10-CM

## 2017-11-15 NOTE — Telephone Encounter (Signed)
Please review and advise.

## 2017-11-16 MED ORDER — TRIAMCINOLONE ACETONIDE 0.5 % EX CREA: 1.0000 "application " | TOPICAL_CREAM | Freq: Three times a day (TID) | CUTANEOUS | 0 refills | Status: DC

## 2017-11-16 MED ORDER — PREDNISONE 10 MG PO TABS: ORAL_TABLET | ORAL | 0 refills | Status: DC

## 2017-11-16 NOTE — Telephone Encounter (Signed)
Patient aware, script is ready. 

## 2017-11-16 NOTE — Telephone Encounter (Signed)
Prescription sent to pharmacy.

## 2017-12-04 ENCOUNTER — Other Ambulatory Visit: Payer: Self-pay | Admitting: Cardiology

## 2018-02-08 ENCOUNTER — Telehealth: Payer: Self-pay | Admitting: Family

## 2018-02-09 MED ORDER — PREDNISONE 20 MG PO TABS
ORAL_TABLET | ORAL | 0 refills | Status: DC
Start: 2018-02-09 — End: 2018-02-22

## 2018-02-09 NOTE — Telephone Encounter (Signed)
Please review and advise if medication will be ordered.

## 2018-02-09 NOTE — Telephone Encounter (Signed)
Left message, script sent in but will need to be seen if not resolved.

## 2018-02-09 NOTE — Telephone Encounter (Signed)
We can do another short course of prednisone but if returns he needs to be seen again

## 2018-02-22 ENCOUNTER — Ambulatory Visit: Payer: BLUE CROSS/BLUE SHIELD | Admitting: Family Medicine

## 2018-02-22 ENCOUNTER — Encounter: Payer: Self-pay | Admitting: Family Medicine

## 2018-02-22 VITALS — BP 139/77 | HR 91 | Temp 97.0°F | Ht 67.0 in | Wt 216.5 lb

## 2018-02-22 DIAGNOSIS — L2084 Intrinsic (allergic) eczema: Secondary | ICD-10-CM | POA: Diagnosis not present

## 2018-02-22 MED ORDER — PREDNISONE 10 MG PO TABS: ORAL_TABLET | ORAL | 0 refills | Status: DC

## 2018-02-22 MED ORDER — CALCIPOTRIENE 0.005 % EX CREA: TOPICAL_CREAM | Freq: Two times a day (BID) | CUTANEOUS | 5 refills | Status: DC

## 2018-02-22 NOTE — Patient Instructions (Signed)
Try elimination diets as discussed, 2 weeks with each food.

## 2018-02-22 NOTE — Progress Notes (Signed)
Chief Complaint  Patient presents with  . Skin Problem    pt here today c/o skin problem on his hands and feet    HPI  Patient presents today for breaking out on the palms and feet.  The breaking out on the feet is not limited to the soles.  He states that there are broad scale is flaking off from the feet however.  He declines to take off his shoes for exam.  Instead he pulls down his sock to show me an erythematous place.  See below.  He says that he has quit eating salad.  There has been dramatic improvement but not resolution.  He still has redness of the foot and ankle as well and has scaling of the hands.  He used to have deep crevices in both.  He has egg, tomato, cheese and salad dressing  as well as other fixings on his salads.  He feels sure that stopping the salad has led to the improvement.  PMH: Smoking status noted ROS: Per HPI  Objective: BP 139/77   Pulse 91   Temp (!) 97 F (36.1 C) (Oral)   Ht  (1.702 m)   Wt 216 lb 8 oz (98.2 kg)   BMI 33.91 kg/m  Gen: NAD, alert, cooperative with exam HEENT: NCAT, EOMI, Ext: No edema, warm.  The hands show thick white scale on the palmar surface and the palmar fingers and wrists.  The left ankle has moderate erythema confluent about the lateral malleolus Neuro: Alert and oriented, No gross deficits  Assessment and plan:  1. Allergic (intrinsic) eczema     Meds ordered this encounter  Medications  . predniSONE (DELTASONE) 10 MG tablet    Sig: Take 5 PO x 3 days, then 4 PO x 3 days, then 3 PO x 3 days, then 2 PO x 3 days then 1 PO x 3 days.    Dispense:  45 tablet    Refill:  0  . calcipotriene (DOVONEX) 0.005 % cream    Sig: Apply topically 2 (two) times daily.    Dispense:  120 g    Refill:  5    Elimination diet of the major components of his salad was recommended.  This should lead to further improvement and hopefully prevent him from needing ongoing treatments with prednisone and Dovonex etc.  However, he should  be aware that the elimination diet cannot be done while under those treatments as noted above.  Based on further discussion regarding the use of tomato extensively in his diet it seems that the elimination of that food would be most likely to net improvement.  However patient expresses reluctance for that same reason. 25 minutes was spent with this evaluation.  More than half was spent in consultation with this patient regarding the Ins and outs of allergic dermatitis and elimination diet as well as treatment options.  Follow up as needed.  Mechele Claude, MD

## 2018-04-18 ENCOUNTER — Encounter: Payer: Self-pay | Admitting: Physician Assistant

## 2018-04-18 ENCOUNTER — Ambulatory Visit: Payer: BLUE CROSS/BLUE SHIELD | Admitting: Physician Assistant

## 2018-04-18 VITALS — BP 174/93 | HR 90 | Temp 98.7°F | Ht 67.0 in | Wt 219.2 lb

## 2018-04-18 DIAGNOSIS — L239 Allergic contact dermatitis, unspecified cause: Secondary | ICD-10-CM | POA: Diagnosis not present

## 2018-04-18 MED ORDER — PREDNISONE 10 MG PO TABS: ORAL_TABLET | ORAL | 0 refills | Status: DC

## 2018-04-18 MED ORDER — CALCIPOTRIENE 0.005 % EX CREA: TOPICAL_CREAM | Freq: Two times a day (BID) | CUTANEOUS | 5 refills | Status: DC

## 2018-04-18 NOTE — Progress Notes (Signed)
BP (!) 174/93   Pulse 90   Temp 98.7 F (37.1 C) (Oral)   Ht 5\' 7"  (1.702 m)   Wt 219 lb 3.2 oz (99.4 kg)   BMI 34.33 kg/m    Subjective:    Patient ID: Thomas Thomas, male    DOB: 08/17/1956, 62 y.o.   MRN: [id number]  HPI: Thomas Thomas is a 62 y.o. male presenting on 04/18/2018 for Rash (right ankle and foot ) and Edema (right ankle and foot )  This patient comes in with a repeated problem of very red ankles.  It will break out in a rash.  He has had a known history of allergies to things in the past.  He has not been to an allergist.  When he has had this he has good response to prednisone and Dovonex.  Other topical steroids have not helped in the past.  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  Multiple dermatologist have seen it in the past.  He is having another exacerbation of it now.  Past Medical History:  Diagnosis Date  . Anxiety   . Diabetes mellitus type II, non insulin dependent ([facility]) 11/2015  . Hypertension   . NSTEMI (non-ST elevated myocardial infarction) ([facility]) 12/23/2015   Relevant past medical, surgical, family and social history reviewed and updated as indicated. Interim medical history since our last visit reviewed. Allergies and medications reviewed and updated. DATA REVIEWED: CHART IN EPIC  Family History reviewed for pertinent findings.  Review of Systems  Constitutional: Negative.  Negative for appetite change and fatigue.  Eyes: Negative for pain and visual disturbance.  Respiratory: Negative.  Negative for cough, chest tightness, shortness of breath and wheezing.   Cardiovascular: Negative.  Negative for chest pain, palpitations and leg swelling.  Gastrointestinal: Negative.  Negative for abdominal pain, diarrhea, nausea and vomiting.  Genitourinary: Negative.   Skin: Positive for color change and rash.  Neurological: Negative.  Negative for weakness, numbness and headaches.  Psychiatric/Behavioral: Negative.     Allergies as of  04/18/2018   No Known Allergies     Medication List        Accurate as of 04/18/18  2:33 PM. Always use your most recent med list.          aspirin 81 MG EC tablet Take 1 tablet (81 mg total) by mouth daily.   atorvastatin 80 MG tablet Commonly known as:  LIPITOR Take 1 tablet (80 mg total) by mouth daily.   calcipotriene 0.005 % cream Commonly known as:  DOVONEX Apply topically 2 (two) times daily.   carvedilol 6.25 MG tablet Commonly known as:  COREG TAKE 1 TABLET BY MOUTH TWICE DAILY WITH A MEAL (PLEASE SCHEDULE FOLLOW UP APPOINTMENT WITH DR Anne FuSKAINS BEFORE FURTHER REFILLS)   lisinopril 2.5 MG tablet Commonly known as:  PRINIVIL,ZESTRIL Take 1 tablet (2.5 mg total) by mouth daily.   predniSONE 10 MG tablet Commonly known as:  DELTASONE Take 5 PO x 3 days, then 4 PO x 3 days, then 3 PO x 3 days, then 2 PO x 3 days then 1 PO x 3 days.   sildenafil 20 MG tablet Commonly known as:  REVATIO Take 2-5 tabs PRN prior to sexual activity          Objective:    BP (!) 174/93   Pulse 90   Temp 98.7 F (37.1 C) (Oral)   Ht 5\' 7"  (1.702 m)   Wt 219 lb 3.2 oz (  99.4 kg)   BMI 34.33 kg/m   No Known Allergies  Wt Readings from Last 3 Encounters:  04/18/18 219 lb 3.2 oz (99.4 kg)  02/22/18 216 lb 8 oz (98.2 kg)  11/03/17 216 lb (98 kg)    Physical Exam  Constitutional: He appears well-developed and well-nourished. No distress.  HENT:  Head: Normocephalic and atraumatic.  Eyes: Pupils are equal, round, and reactive to light. Conjunctivae and EOM are normal.  Cardiovascular: Normal rate, regular rhythm and normal heart sounds.  Pulmonary/Chest: Effort normal and breath sounds normal. No respiratory distress.  Skin: Skin is warm and dry. Lesion and rash noted. Rash is macular. There is erythema.     Psychiatric: He has a normal mood and affect. His behavior is normal.  Nursing note and vitals reviewed.   Results for orders placed or performed in visit on 08/18/17    Comprehensive metabolic panel  Result Value Ref Range   Glucose 103 (H) 65 - 99 mg/dL   BUN 16 8 - 27 mg/dL   Creatinine, Ser 1.61 0.76 - 1.27 mg/dL   GFR calc non Af Amer 76 >59 mL/min/1.73   GFR calc Af Amer 88 >59 mL/min/1.73   BUN/Creatinine Ratio 15 10 - 24   Sodium 137 134 - 144 mmol/L   Potassium 5.0 3.5 - 5.2 mmol/L   Chloride 98 96 - 106 mmol/L   CO2 25 20 - 29 mmol/L   Calcium 9.7 8.6 - 10.2 mg/dL   Total Protein 7.3 6.0 - 8.5 g/dL   Albumin 4.4 3.6 - 4.8 g/dL   Globulin, Total 2.9 1.5 - 4.5 g/dL   Albumin/Globulin Ratio 1.5 1.2 - 2.2   Bilirubin Total 0.3 0.0 - 1.2 mg/dL   Alkaline Phosphatase 67 39 - 117 IU/L   AST 14 0 - 40 IU/L   ALT 23 0 - 44 IU/L  Lipid panel  Result Value Ref Range   Cholesterol, Total 188 100 - 199 mg/dL   Triglycerides 55 0 - 149 mg/dL   HDL 39 (L) >09 mg/dL   VLDL Cholesterol Cal 11 5 - 40 mg/dL   LDL Calculated 604 (H) 0 - 99 mg/dL   Chol/HDL Ratio 4.8 0.0 - 5.0 ratio      Assessment & Plan:   1. Allergic dermatitis - Ambulatory referral to Allergy   Continue all other maintenance medications as listed above.  Follow up plan: No follow-ups on file.  Educational handout given for survey  Remus Loffler PA-C [facility] [address]  [place], [place] [address] [phone]   04/18/2018, 2:33 PM

## 2018-05-04 ENCOUNTER — Encounter: Payer: Self-pay | Admitting: Pediatrics

## 2018-05-12 ENCOUNTER — Other Ambulatory Visit: Payer: Self-pay | Admitting: Physician Assistant

## 2018-05-12 ENCOUNTER — Telehealth: Payer: Self-pay | Admitting: Family

## 2018-05-12 MED ORDER — PREDNISONE 10 MG PO TABS: ORAL_TABLET | ORAL | 0 refills | Status: DC

## 2018-05-12 NOTE — Telephone Encounter (Signed)
Pt aware.

## 2018-05-12 NOTE — Telephone Encounter (Signed)
sent 

## 2018-06-13 ENCOUNTER — Ambulatory Visit: Payer: BLUE CROSS/BLUE SHIELD | Admitting: Physician Assistant

## 2018-06-13 ENCOUNTER — Encounter: Payer: Self-pay | Admitting: Physician Assistant

## 2018-06-13 VITALS — BP 153/86 | HR 87 | Temp 98.9°F | Ht 67.0 in | Wt 219.2 lb

## 2018-06-13 DIAGNOSIS — L272 Dermatitis due to ingested food: Secondary | ICD-10-CM | POA: Diagnosis not present

## 2018-06-13 MED ORDER — FLUCONAZOLE 150 MG PO TABS: ORAL_TABLET | ORAL | 0 refills | Status: DC

## 2018-06-13 MED ORDER — CEFDINIR 300 MG PO CAPS: 300.0000 mg | ORAL_CAPSULE | Freq: Two times a day (BID) | ORAL | 0 refills | Status: DC

## 2018-06-13 MED ORDER — PREDNISONE 10 MG PO TABS: ORAL_TABLET | ORAL | 0 refills | Status: DC

## 2018-06-13 MED ORDER — CHLORHEXIDINE GLUCONATE 0.12% ORAL RINSE (MEDLINE KIT)
15.0000 mL | Freq: Two times a day (BID) | OROMUCOSAL | 2 refills | Status: DC
Start: 2018-06-13 — End: 2018-11-02

## 2018-06-13 MED ORDER — HYDROCHLOROTHIAZIDE 25 MG PO TABS: 25.0000 mg | ORAL_TABLET | Freq: Every day | ORAL | 3 refills | Status: DC

## 2018-06-15 NOTE — Progress Notes (Signed)
BP (!) 153/86   Pulse 87   Temp 98.9 F (37.2 C) (Oral)   Ht _0  (1.702 m)   Wt 219 lb 3.2 oz (99.4 kg)   BMI 34.33 kg/m    Subjective:    Patient ID: Thomas Thomas, male    DOB: 1955/12/25, 62 y.o.   MRN: [id number]  HPI: Thomas Thomas is a 62 y.o. male presenting on 06/13/2018 for Rash (both ankles )  This patient comes in for the flareup of his ankle rash.  He has had multiple looks episodes of this.  He will be going to dermatology later in the week.  He has had a significant amount of swelling this time around both ankles.  The left is very much swollen.  He denies any fever or chills.  Past Medical History:  Diagnosis Date  . Anxiety   . Diabetes mellitus type II, non insulin dependent ([facility]) 11/2015  . Hypertension   . NSTEMI (non-ST elevated myocardial infarction) ([place]) 12/23/2015   Relevant past medical, surgical, family and social history reviewed and updated as indicated. Interim medical history since our last visit reviewed. Allergies and medications reviewed and updated. DATA REVIEWED: CHART IN EPIC  Family History reviewed for pertinent findings.  Review of Systems  Constitutional: Negative.  Negative for appetite change and fatigue.  Eyes: Negative for pain and visual disturbance.  Respiratory: Negative.  Negative for cough, chest tightness, shortness of breath and wheezing.   Cardiovascular: Negative.  Negative for chest pain, palpitations and leg swelling.  Gastrointestinal: Negative.  Negative for abdominal pain, diarrhea, nausea and vomiting.  Genitourinary: Negative.   Skin: Positive for color change and wound. Negative for rash.  Neurological: Negative.  Negative for weakness, numbness and headaches.  Psychiatric/Behavioral: Negative.     Allergies as of 06/13/2018   No Known Allergies     Medication List        Accurate as of 06/13/18 11:59 PM. Always use your most recent med list.          aspirin 81 MG EC tablet Take 1 tablet (81 mg  total) by mouth daily.   atorvastatin 80 MG tablet Commonly known as:  LIPITOR Take 1 tablet (80 mg total) by mouth daily.   carvedilol 6.25 MG tablet Commonly known as:  COREG TAKE 1 TABLET BY MOUTH TWICE DAILY WITH A MEAL (PLEASE SCHEDULE FOLLOW UP APPOINTMENT WITH DR Marlou Porch BEFORE FURTHER REFILLS)   cefdinir 300 MG capsule Commonly known as:  OMNICEF Take 1 capsule (300 mg total) by mouth 2 (two) times daily. 1 po BID   chlorhexidine gluconate (MEDLINE KIT) 0.12 % solution Commonly known as:  PERIDEX Use as directed 15 mLs in the mouth or throat 2 (two) times daily.   fluconazole 150 MG tablet Commonly known as:  DIFLUCAN 1 po q week x 4 weeks   hydrochlorothiazide 25 MG tablet Commonly known as:  HYDRODIURIL Take 1 tablet (25 mg total) by mouth daily.   lisinopril 2.5 MG tablet Commonly known as:  PRINIVIL,ZESTRIL Take 1 tablet (2.5 mg total) by mouth daily.   predniSONE 10 MG tablet Commonly known as:  DELTASONE Take 5 PO x 3 days, then 4 PO x 3 days, then 3 PO x 3 days, then 2 PO x 3 days then 1 PO x 3 days.   sildenafil 20 MG tablet Commonly known as:  REVATIO Take 2-5 tabs PRN prior to sexual activity  Objective:    BP (!) 153/86   Pulse 87   Temp 98.9 F (37.2 C) (Oral)   Ht _0  (1.702 m)   Wt 219 lb 3.2 oz (99.4 kg)   BMI 34.33 kg/m   No Known Allergies  Wt Readings from Last 3 Encounters:  06/13/18 219 lb 3.2 oz (99.4 kg)  04/18/18 219 lb 3.2 oz (99.4 kg)  02/22/18 216 lb 8 oz (98.2 kg)    Physical Exam  Constitutional: He appears well-developed and well-nourished. No distress.  HENT:  Head: Normocephalic and atraumatic.  Eyes: Pupils are equal, round, and reactive to light. Conjunctivae and EOM are normal.  Cardiovascular: Normal rate, regular rhythm and normal heart sounds.  Pulmonary/Chest: Effort normal and breath sounds normal. No respiratory distress.  Skin: Skin is warm and dry. Lesion and rash noted. Rash is macular. There  is erythema.     Psychiatric: He has a normal mood and affect. His behavior is normal.  Nursing note and vitals reviewed.   Results for orders placed or performed in visit on 08/18/17  Comprehensive metabolic panel  Result Value Ref Range   Glucose 103 (H) 65 - 99 mg/dL   BUN 16 8 - 27 mg/dL   Creatinine, Ser 1.05 0.76 - 1.27 mg/dL   GFR calc non Af Amer 76 >59 mL/min/1.73   GFR calc Af Amer 88 >59 mL/min/1.73   BUN/Creatinine Ratio 15 10 - 24   Sodium 137 134 - 144 mmol/L   Potassium 5.0 3.5 - 5.2 mmol/L   Chloride 98 96 - 106 mmol/L   CO2 25 20 - 29 mmol/L   Calcium 9.7 8.6 - 10.2 mg/dL   Total Protein 7.3 6.0 - 8.5 g/dL   Albumin 4.4 3.6 - 4.8 g/dL   Globulin, Total 2.9 1.5 - 4.5 g/dL   Albumin/Globulin Ratio 1.5 1.2 - 2.2   Bilirubin Total 0.3 0.0 - 1.2 mg/dL   Alkaline Phosphatase 67 39 - 117 IU/L   AST 14 0 - 40 IU/L   ALT 23 0 - 44 IU/L  Lipid panel  Result Value Ref Range   Cholesterol, Total 188 100 - 199 mg/dL   Triglycerides 55 0 - 149 mg/dL   HDL 39 (L) >39 mg/dL   VLDL Cholesterol Cal 11 5 - 40 mg/dL   LDL Calculated 138 (H) 0 - 99 mg/dL   Chol/HDL Ratio 4.8 0.0 - 5.0 ratio      Assessment & Plan:   1. Food allergic skin reaction - predniSONE (DELTASONE) 10 MG tablet; Take 5 PO x 3 days, then 4 PO x 3 days, then 3 PO x 3 days, then 2 PO x 3 days then 1 PO x 3 days.  Dispense: 45 tablet; Refill: 0 - chlorhexidine gluconate, MEDLINE KIT, (PERIDEX) 0.12 % solution; Use as directed 15 mLs in the mouth or throat 2 (two) times daily.  Dispense: 473 mL; Refill: 2 - cefdinir (OMNICEF) 300 MG capsule; Take 1 capsule (300 mg total) by mouth 2 (two) times daily. 1 po BID  Dispense: 20 capsule; Refill: 0 - fluconazole (DIFLUCAN) 150 MG tablet; 1 po q week x 4 weeks  Dispense: 4 tablet; Refill: 0   Continue all other maintenance medications as listed above.  Follow up plan: No follow-ups on file.  Educational handout given for [place]  PA-C [facility] [address]  [place], [place] [address] [phone]   06/15/2018, 11:29 PM

## 2018-06-16 ENCOUNTER — Ambulatory Visit: Payer: BLUE CROSS/BLUE SHIELD | Admitting: Allergy & Immunology

## 2018-06-16 ENCOUNTER — Encounter: Payer: Self-pay | Admitting: Allergy & Immunology

## 2018-06-16 VITALS — BP 130/80 | HR 64 | Temp 97.7°F | Resp 16 | Ht 65.5 in | Wt 216.0 lb

## 2018-06-16 DIAGNOSIS — L239 Allergic contact dermatitis, unspecified cause: Secondary | ICD-10-CM | POA: Diagnosis not present

## 2018-06-16 NOTE — Patient Instructions (Addendum)
1. Allergic contact dermatitis - We will get your recent notes from Dr. Margo AyeHall. - I would like to get you in for patch testing (requires placement on a Monday with reads on a Wednesday and Friday). - You need to be off of prednisone for two weeks before this is done.  - We may also get some labs at that time as well once we get all of your records in.  - You can take antihistamines to help with the itching as well.   2. [facility]on 07[facility]18/2018[facility]   Please inform us of any Emergency Department visits, hospitalizations, or changes in symptoms. Call us before going to the ED for breathing or allergy symptoms since we might be able to fit you in for a sick visit. Feel free to contact us anytime with any questions, problems, or concerns.  It was a pleasure to meet you today!  Websites that have reliable patient information: 1. American Academy of Asthma, Allergy, and Immunology: www.aaaai.org 2. Food Allergy Research and Education (FARE): foodallergy.org 3. Mothers of Asthmatics: http://www.asthmacommunitynetwork.org 4. American College of Allergy, Asthma, and Immunology: MissingWeapons.cawww.acaai.org   Make sure you are registered to vote! If you have moved or changed any of your contact information, you will need to get this updated before voting!

## 2018-06-16 NOTE — Progress Notes (Addendum)
NEW PATIENT  Date of Service/Encounter:  06/16/18  Referring provider: Sharion Balloon, FNP   Assessment:   Allergic contact dermatitis - unknown trigger   [name] presents following years of a rash on his bilateral ankles as well as fissuring and dryness of his hands.  It is not clear whether both of these are related.  The area on his ankles certainly seems like severe contact dermatitis.  He did have a handle on these symptoms when he was changing his socks on a monthly basis and washing them nightly with a strong detergent.  However, they have acutely returned.  He is not using a dye free laundry detergent, but he would think that he would be having a reaction over his entire body if his laundry detergent was the trigger for his symptoms.  He does respond very well to prednisone, which is reassuring.  He has no systemic symptoms that would be concerning for a bullous dermatitis.  I think patch testing would be very enlightening for him, and we will schedule this for when he has been off of prednisone for at least 2 weeks.  I did discuss how this would need to be done in the [place] office since the [place] office is not open more than 1 day/week.  He does not get off of work until 330, so the earliest he can make it down here as 4:00.  This would work on a Monday and Wednesday, but we will have to check to make sure it is okay to do it on a Friday.  I am not entirely sure what is going on with his hands.  There still might be some kind of exposure at work.  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  He will try to get some samples of these oils and solvents so that we can do diluted patch testing to these.  He has never been officially diagnosed with eczema, but we are going to review his previous dermatology records to see what they felt these lesions represented.  He has not responded well at all to any topical steroids, instead  only responding to prednisone.  Plan/Recommendations:    Patient Instructions  1. Allergic contact dermatitis - We will get your recent notes from Dr. Nevada Crane. - I would like to get you in for patch testing (requires placement on a Monday with reads on a Wednesday and Friday). - You need to be off of prednisone for two weeks before this is done.  - We may also get some labs at that time as well once we get all of your records in.  - You can take antihistamines to help with the itching as well.   2. Return in 1 month (on 07/18/2018) for [place].  Subjective:   Savan Ruta is a 62 y.o. male presenting today for evaluation of  Chief Complaint  Patient presents with  . New Patient (Initial Visit)    5 yrs ago eruptions on hands and feet; legs raw patch both legs with weeping; flakes    [name] has a history of the following: Patient Active Problem List   Diagnosis Date Noted  . 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 CAD (coronary artery disease)   . NSTEMI (non-ST elevated myocardial infarction) ([place])   . 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 Metabolic syndrome 09/32/6712  . Vitamin D deficiency 01/25/2015  .  GERD (gastroesophageal reflux disease) 01/24/2015    History obtained from: chart review and patient.  [name] was referred by Sharion Balloon, FNP.     Nathin is a 62 y.o. male presenting for an evaluation of a rash.  He has 2 separate rashes going on.  The first 1 that is noted as well as his hand rash.  He reports that he intermittently has episodes of a flaky rash with fissure formation on his hands.  He has treated this with topical steroids with minimal improvement.  It seems to just resolve on its own.  He will use Vaseline to smooth out the fissures, which seems to help.  Evidently, at one point he was treated with methotrexate by a dermatologist, which he did not tolerate well.  He is less concerned with this rash compared to the one on his  ankles.  The rash on his ankles is much more severe, with desquamation of his top layers of skin.  It is only from the sock line down and it is bilateral.  He says this will itch occasionally but is much more painful than itching.  This is been going on for a matter of years, likely 5 years or more.  He has treated with a multitude of topical steroids without improvement.  In the last 4 months, he has received 3 separate rounds of prednisone, which seems to be the only thing that will clear it up.  Even now when he is on prednisone, it is not clear completely.  He was most recently started on prednisone earlier this month and has 12 more days remaining in the taper.  He is not sure of any particular triggers.  He has tried changing his shoes without any improvement.  He does get a new pair of socks every month and throws out the old pair.  He will wash his socks every day and the same detergent.  Currently he is using Tide with a yellow top.  He does wear jeans every day at work, but the rest of his legs are completely symptom-free.  He has been seen by a multitude of dermatologist.  Currently, he has been followed by Dr. Allyn Kenner in [place].  He recommended using goat soap on his hands and has started him on prednisone for his ankles.  He has seen Dr. Tarri Glenn in [place] in the past as well, but these visits have not been fruitful.  He reports that they have swabbed the lesions in the past, presumably to look for Staphylococcus, but he has never had a biopsy.  We do not have his outside dermatology records at this time.  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  He does not use gloves at his workplace, as they affect his dexterity.  He does note that some of these chemicals do splashed on the floor.  He does not think that they get into his shoe but does report that they might see been through the sole of his shoe.  He does tell me today that many of his coworkers  have had similar rashes.  When he was younger, he did have a history of allergic rhinitis symptoms.  Around his early 65s, his symptoms slowly resolved and they are no longer an issue.  He has not been allergy tested to his knowledge.  He tolerates all of the major food allergens without adverse event.  He has no history of asthma.  He was a smoker for 19 pack years from 31 through 2011.  He had quadruple bypass in 2017 and is now on several heart medications.   Otherwise, there is no history of other atopic diseases, including asthma, drug allergies, stinging insect allergies, or urticaria. There is no significant infectious history. Vaccinations are up to date.    Past Medical History: Patient Active Problem List   Diagnosis Date Noted  . 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 CAD (coronary artery disease)   . NSTEMI (non-ST elevated myocardial infarction) ([place])   . 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 Metabolic syndrome 16/07/9603  . Vitamin D deficiency 01/25/2015  . GERD (gastroesophageal reflux disease) 01/24/2015    Medication List:  Allergies as of 06/16/2018   No Known Allergies     Medication List        Accurate as of 06/16/18  3:59 PM. Always use your most recent med list.          aspirin 81 MG EC tablet Take 1 tablet (81 mg total) by mouth daily.   atorvastatin 80 MG tablet Commonly known as:  LIPITOR Take 1 tablet (80 mg total) by mouth daily.   carvedilol 6.25 MG tablet Commonly known as:  COREG TAKE 1 TABLET BY MOUTH TWICE DAILY WITH A MEAL (PLEASE SCHEDULE FOLLOW UP APPOINTMENT WITH DR Marlou Porch BEFORE FURTHER REFILLS)   cefdinir 300 MG capsule Commonly known as:  OMNICEF Take 1 capsule (300 mg total) by mouth 2 (two) times daily. 1 po BID   chlorhexidine gluconate (MEDLINE KIT) 0.12 % solution Commonly known as:  PERIDEX Use as directed 15 mLs in the mouth or throat 2 (two) times daily.   hydrochlorothiazide 25 MG tablet Commonly known as:   HYDRODIURIL Take 1 tablet (25 mg total) by mouth daily.   lisinopril 2.5 MG tablet Commonly known as:  PRINIVIL,ZESTRIL Take 1 tablet (2.5 mg total) by mouth daily.   predniSONE 10 MG tablet Commonly known as:  DELTASONE Take 5 PO x 3 days, then 4 PO x 3 days, then 3 PO x 3 days, then 2 PO x 3 days then 1 PO x 3 days.   sildenafil 20 MG tablet Commonly known as:  REVATIO Take 2-5 tabs PRN prior to sexual activity       Birth History: non-contributory.   Developmental History: non-contributory.   Past Surgical History: Past Surgical History:  Procedure Laterality Date  . CARDIAC CATHETERIZATION N/A 12/25/2015   Procedure: Left Heart Cath and Coronary Angiography;  Surgeon: Peter M Martinique, MD;  Location: [facility];  Service: Cardiovascular;  Laterality: N/A;  . CORONARY ARTERY BYPASS GRAFT N/A 01/02/2016   Procedure: CORONARY ARTERY BYPASS GRAFTING (CABG)X 4 LIMA-LAD; SVG-RCA(ENDARDERECTOMY); SVG-DIAG; SVG-OM ENDOSCOPIC GREATER RIGHT SAPHENOUS VEIN HARVEST;  Surgeon: Ivin Poot, MD; LIMA-LAD, SVG-D, SVG-OM, SVG-RCA-PDA    . HERNIA REPAIR    . KNEE SURGERY    . TEE WITHOUT CARDIOVERSION N/A 01/02/2016   Procedure: TRANSESOPHAGEAL ECHOCARDIOGRAM (TEE);  Surgeon: Ivin Poot, MD;  Location: [facility];  Service: Open Heart Surgery;  Laterality: N/A;  . TONSILLECTOMY    . US ECHOCARDIOGRAPHY  12/24/2015   EF 55-60%, grade 1 dd     Family History: Family History  Problem Relation Age of Onset  . Heart disease Mother   . Cancer Father        stomach  . Allergic rhinitis Sister   . Asthma Neg Hx   . Eczema Neg  Hx      Social History: Zachrey lives at home with his wife.  They live in a house that is around 63 years old.  There is hardwood in the main living areas and tiling in the bedrooms.  They have electric heating and window units for cooling.  There are no animals inside or outside the home.  He does have dust mite covers on his bedding.  There is no current  tobacco exposure.  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    Review of Systems: a 14-point review of systems is pertinent for what is mentioned in HPI.  Otherwise, all other systems were negative. Constitutional: negative other than that listed in the HPI Eyes: negative other than that listed in the HPI Ears, nose, mouth, throat, and face: negative other than that listed in the HPI Respiratory: negative other than that listed in the HPI Cardiovascular: negative other than that listed in the HPI Gastrointestinal: negative other than that listed in the HPI Genitourinary: negative other than that listed in the HPI Integument: negative other than that listed in the HPI Hematologic: negative other than that listed in the HPI Musculoskeletal: negative other than that listed in the HPI Neurological: negative other than that listed in the HPI Allergy/Immunologic: negative other than that listed in the HPI    Objective:   Blood pressure 130/80, pulse 64, temperature 97.7 F (36.5 C), temperature source Tympanic, resp. rate 16, height 5' 5.5" (1.664 m), weight 216 lb (98 kg). Body mass index is 35.4 kg/m.   Physical Exam:  General: Alert, interactive, in no acute distress. Pleasant male. Talkative.  Eyes: No conjunctival injection bilaterally, no discharge on the right, no discharge on the left and no Horner-Trantas dots present. PERRL bilaterally. EOMI without pain. No photophobia.  Ears: Right TM pearly gray with normal light reflex, Left TM pearly gray with normal light reflex, Right TM intact without perforation and Left TM intact without perforation.  Nose/Throat: External nose within normal limits and septum midline. Turbinates edematous and pale with clear discharge. Posterior oropharynx erythematous without cobblestoning in the posterior oropharynx. Tonsils 2+ without exudates.  Tongue without  thrush. Neck: Supple without thyromegaly. Trachea midline. Adenopathy: no enlarged lymph nodes appreciated in the anterior cervical, occipital, axillary, epitrochlear, inguinal, or popliteal regions. Lungs: Clear to auscultation without wheezing, rhonchi or rales. No increased work of breathing. CV: Normal S1/S2. No murmurs. Capillary refill <2 seconds.  Abdomen: Nondistended, nontender. No guarding or rebound tenderness. Bowel sounds present in all fields and hypoactive  Skin: Dry, erythematous, excoriated patches on the bilateral hands with some mild scaling in  glove distribution. There is also a severe scalded lesons over his bilateral ankles extending from the malleoli to the ankles. It does not extend to the bottoms of the feet. There is no honey crusting at this time and there is no weeping noted. Extremities:  No clubbing, cyanosis or edema. Neuro:   Grossly intact. No focal deficits appreciated. Responsive to questions.  Diagnostic studies: none       Salvatore Marvel, MD [facility]

## 2018-06-21 NOTE — Progress Notes (Signed)
Set up patch testing for the week of sept 30 Called patient Left message to confirm appts for testing

## 2018-07-14 NOTE — Progress Notes (Signed)
[facility] He did have a biopsy performed in May 2015 that demonstrated seborrheic keratosis and sebaceous gland hyperplasia.  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  There is 1 culture that grew out MSSA.  It seems that he was treated with terbinafine as well as doxycycline and clobetasol.  He also received a course of ciprofloxacin. There was reported improvement, but that was not reflected in my visit with him. This was 2015, however, so things clearly might have changed.   Malachi BondsJoel Brittan Butterbaugh, MD [facility]

## 2018-07-25 ENCOUNTER — Ambulatory Visit: Payer: BLUE CROSS/BLUE SHIELD | Admitting: Allergy and Immunology

## 2018-07-25 ENCOUNTER — Encounter: Payer: Self-pay | Admitting: Allergy and Immunology

## 2018-07-25 VITALS — Ht 67.0 in | Wt 217.0 lb

## 2018-07-25 DIAGNOSIS — L235 Allergic contact dermatitis due to other chemical products: Secondary | ICD-10-CM

## 2018-07-25 NOTE — Progress Notes (Signed)
    Follow-up Note  RE: Thomas Thomas MRN: [id number] DOB: [birth year] Date of Office Visit: 07/25/2018  Primary care provider: Sharion Balloon, FNP Referring provider: Sharion Balloon, FNP  History of present illness: Thomas Thomas is a 62 y.o. male with dermatitis presenting today for patch test placement.  Assessment and plan: Allergic contact dermatitis  T.R.U.E. patch test panel has been placed.  Instructions have been provided regarding keeping the patches clean and dry as well as returning at appropriate intervals for patch test interpretation.   Diagnostics: T.R.U.E. patch test panel has been placed.    Physical examination: Height '5\' 7"'$  (1.702 m), weight 217 lb (98.4 kg).  General: Alert, interactive, in no acute distress. Neck: Supple without lymphadenopathy. Lungs: Clear to auscultation without wheezing, rhonchi or rales. CV: Normal S1, S2 without murmurs. Skin: Dry, erythematous, excoriated patches on the ankles. Dry, peeling patches on the palms bilaterally.  The following portions of the patient's history were reviewed and updated as appropriate: allergies, current medications, past family history, past medical history, past social history, past surgical history and problem list.  Allergies as of 07/25/2018   No Known Allergies     Medication List        Accurate as of 07/25/18  6:22 PM. Always use your most recent med list.          aspirin 81 MG EC tablet Take 1 tablet (81 mg total) by mouth daily.   atorvastatin 80 MG tablet Commonly known as:  LIPITOR Take 1 tablet (80 mg total) by mouth daily.   carvedilol 6.25 MG tablet Commonly known as:  COREG TAKE 1 TABLET BY MOUTH TWICE DAILY WITH A MEAL (PLEASE SCHEDULE FOLLOW UP APPOINTMENT WITH DR Marlou Porch BEFORE FURTHER REFILLS)   cefdinir 300 MG capsule Commonly known as:  OMNICEF Take 1 capsule (300 mg total) by mouth 2 (two) times daily. 1 po BID   chlorhexidine gluconate (MEDLINE KIT) 0.12 %  solution Commonly known as:  PERIDEX Use as directed 15 mLs in the mouth or throat 2 (two) times daily.   hydrochlorothiazide 25 MG tablet Commonly known as:  HYDRODIURIL Take 1 tablet (25 mg total) by mouth daily.   lisinopril 2.5 MG tablet Commonly known as:  PRINIVIL,ZESTRIL Take 1 tablet (2.5 mg total) by mouth daily.   predniSONE 10 MG tablet Commonly known as:  DELTASONE Take 5 PO x 3 days, then 4 PO x 3 days, then 3 PO x 3 days, then 2 PO x 3 days then 1 PO x 3 days.   sildenafil 20 MG tablet Commonly known as:  REVATIO Take 2-5 tabs PRN prior to sexual activity       No Known Allergies  I appreciate the opportunity to take part in Ido's care. Please do not hesitate to contact me with questions.  Sincerely,   R. Edgar Frisk, MD

## 2018-07-25 NOTE — Patient Instructions (Signed)
Allergic contact dermatitis  T.R.U.E. patch test panel has been placed.  Instructions have been provided regarding keeping the patches clean and dry as well as returning at appropriate intervals for patch test interpretation.   Return in about 2 days (around 07/27/2018) for patch test interpretation.

## 2018-07-25 NOTE — Assessment & Plan Note (Signed)
   T.R.U.E. patch test panel has been placed.  Instructions have been provided regarding keeping the patches clean and dry as well as returning at appropriate intervals for patch test interpretation. 

## 2018-07-27 ENCOUNTER — Ambulatory Visit (INDEPENDENT_AMBULATORY_CARE_PROVIDER_SITE_OTHER): Payer: BLUE CROSS/BLUE SHIELD | Admitting: Allergy & Immunology

## 2018-07-27 DIAGNOSIS — L239 Allergic contact dermatitis, unspecified cause: Secondary | ICD-10-CM

## 2018-07-27 NOTE — Progress Notes (Signed)
    Follow-up Note  RE: Meiko Stranahan MRN: [id number] DOB: [birth year] Date of Office Visit: 07/27/2018  Primary care provider: Junie Spencer, FNP Referring provider: Junie Spencer, FNP    Raiden returns to the office today for the initial patch test interpretation, given suspected history of contact dermatitis.  He does provide several more pictures of the worst cases on his feet.  He also shows me his hands today, which are cracked peeling.  It does look rather painful.  On exam, he also has what seems to be acne on his back.  His hands are quite lichenified and peeling.  He reports that his feet are improving since stopping all the topical steroids, which makes me think that he might have developed a corticosteroid sensitization.  We may consider having him come in next week for one last read to see if any of the steroids reacted since these reactions can take upwards of 7 days to manifest on testing.    Diagnostics:   TRUE TEST 48-hour hour reading: scaling reaction to  #6 (Fragrance mix) and 1+ reaction to #28 (Gold sodium thiosulfate)  Plan:   Allergic contact dermatitis - The patient has been provided detailed information regarding the substances she is sensitive to, as well as products containing the substances.   - Meticulous avoidance of these substances is recommended.  - If avoidance is not possible, the use of barrier creams or lotions is recommended. - If symptoms persist or progress despite meticulous avoidance of the above chemicals, Dermatology Referral may be warranted.      [name], MD  [facility]

## 2018-07-28 ENCOUNTER — Encounter: Payer: Self-pay | Admitting: Allergy & Immunology

## 2018-07-29 ENCOUNTER — Encounter: Payer: Self-pay | Admitting: Allergy & Immunology

## 2018-07-29 ENCOUNTER — Ambulatory Visit: Payer: BLUE CROSS/BLUE SHIELD | Admitting: Allergy & Immunology

## 2018-07-29 ENCOUNTER — Encounter: Payer: BLUE CROSS/BLUE SHIELD | Admitting: Allergy & Immunology

## 2018-07-29 DIAGNOSIS — L239 Allergic contact dermatitis, unspecified cause: Secondary | ICD-10-CM | POA: Diagnosis not present

## 2018-07-29 NOTE — Progress Notes (Signed)
    Follow-up Note  RE: Thomas Thomas MRN: [id number] DOB: 1956-06-06 Date of Office Visit: 07/29/2018  Primary care provider: Junie Spencer, FNP Referring provider: Junie Spencer, FNP   [name] returns to the office today for the final patch test interpretation, given suspected history of contact dermatitis.  He has provides more pictures today.  He continues to note that his foot is actually getting much better being off of the topical steroid.  Prednisone does seem to help.  Given this, we will bring him back in 1 week, since steroids can take upwards of 7 days to show on patch testing.  Another thing to consider in the future would be cutaneous T-cell lymphoma, however I cannot imagine that would be just isolated to the feet in the hands.   Diagnostics:   TRUE TEST 96-hour hour reading: equivocal reaction to #13 (p-tert Butylphenol formaldehyde resin) and 1+ reaction to #22 (Mercapto mix)  Plan:   Allergic contact dermatitis (p-tert Butylphenol formaldehyde resin, mercapto mix, gold sodium thiosulfate) - but none were strongly reactive - The patient has been provided detailed information regarding the substances she is sensitive to, as well as products containing the substances.   - Meticulous avoidance of these substances is recommended.  - If avoidance is not possible, the use of barrier creams or lotions is recommended. - If symptoms persist or progress despite meticulous avoidance of the above chemicals, Dermatology Referral may be warranted.   [name] also provided some more pictures:          [name], MD  [facility]

## 2018-08-01 ENCOUNTER — Encounter: Payer: Self-pay | Admitting: Allergy & Immunology

## 2018-08-01 ENCOUNTER — Ambulatory Visit: Payer: BLUE CROSS/BLUE SHIELD | Admitting: Allergy & Immunology

## 2018-08-01 DIAGNOSIS — L235 Allergic contact dermatitis due to other chemical products: Secondary | ICD-10-CM

## 2018-08-01 NOTE — Progress Notes (Signed)
    Follow-up Note  RE: Thomas Thomas MRN: [id number] DOB: 1956-07-05 Date of Office Visit: 08/01/2018  Primary care provider: Junie Spencer, FNP Referring provider: Junie Spencer, FNP   [name] returns to the office today for the final patch test interpretation, given suspected history of contact dermatitis.    Diagnostics:   TRUE TEST 7-day hour reading: 2+ reaction to #13 (p-tert Butylphenol formaldehyde resin) and 2+ reaction to #22 (Mercapto mix)  Plan:   Allergic contact dermatitis -We had previously provided avoidance measures for the above chemicals since they were reactive at the last visit. -I was thinking that his steroids would be positive given his history, but this was not the case. -With the hand and glove distribution, I continues to think that this has something to do with his laundry detergent in the case of his feet.  -However, I find it more difficult to explain the hands. 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 -Lesions on the hand could be consistent with atopic dermatitis, which I would probably treat with a trial of Dupixent. -However, the lesions on his feet are certainly more consistent with contact dermatitis. -Given the severity of his symptoms, I would like him to see Dr. Tresa Res. -Referral placed today.   [name], MD  [facility]

## 2018-08-02 NOTE — Progress Notes (Signed)
I have sent a in basket to Dr Dellis Anes. Dr Lenn Sink office is booked out till January 2020/

## 2018-08-05 ENCOUNTER — Encounter: Payer: Self-pay | Admitting: Allergy & Immunology

## 2018-08-08 DIAGNOSIS — L409 Psoriasis, unspecified: Secondary | ICD-10-CM | POA: Diagnosis not present

## 2018-09-07 ENCOUNTER — Telehealth: Payer: Self-pay

## 2018-09-07 NOTE — Telephone Encounter (Signed)
LM for patient to return call.

## 2018-09-07 NOTE — Telephone Encounter (Signed)
-----   Message from Alfonse SpruceJoel Louis Gallagher, MD sent at 09/07/2018 10:08 AM EST ----- Can someone call [name] to see how he is doing with regards to his skin? Just wanted to check in! Thanks!

## 2018-09-07 NOTE — Progress Notes (Signed)
We received the notes from Dr. Jorja Loaafeen.  He feels that [name] is likely suffering from psoriasis rather than atopic dermatitis.  He started halobetasol and wet wraps 1-2 times daily for the next 4 weeks.  He is going to follow-up in 1 month.  We will call him and make sure that he has improved.   Malachi BondsJoel Gallagher, MD [facility]

## 2018-09-07 NOTE — Progress Notes (Signed)
LM for patient to return call.

## 2018-09-07 NOTE — Telephone Encounter (Signed)
Per Dr. Dellis AnesGallagher, Can someone call Mr. [facility]ton [facility] to see how his rash is doing with the new ointment from Dr. Jorja Loaafeen? Thanks!

## 2018-09-08 NOTE — Telephone Encounter (Signed)
Called patient but did not receive an answer. I was not able to leave a message.

## 2018-09-13 NOTE — Telephone Encounter (Signed)
Spoke with patient.  He stated that he is doing fantastic! He did say that he has a rash that periodically appears across his shoulders.  I informed patient the next time he has an episode to keep a journal of things that he has consumed or activities he was doing the 6 hours prior to break out.  Patient stated that he would do so and would call back with any future questions or concerns.

## 2018-09-13 NOTE — Telephone Encounter (Signed)
That is fantastic to hear.  Thank you for giving him a call.  Malachi BondsJoel Greer Wainright, MD [facility]

## 2018-09-27 ENCOUNTER — Other Ambulatory Visit: Payer: Self-pay | Admitting: Cardiology

## 2018-10-23 ENCOUNTER — Other Ambulatory Visit: Payer: Self-pay | Admitting: Cardiology

## 2018-10-31 ENCOUNTER — Telehealth: Payer: Self-pay | Admitting: Cardiology

## 2018-10-31 NOTE — Telephone Encounter (Signed)
Spoke with patient and scheduled 1 yr f/u with Dr Anne Fu for 11/02/2018.  He thanked me for the call.

## 2018-10-31 NOTE — Telephone Encounter (Signed)
New Message  Pt states that when he comes to the office and try to schedule a f/u after his appt he is always told that he will be contacted. But states he never hears or receives any reminder telling him to schedule and he ends up running out of meds. Pt states he would like to speak to someone regarding the matter

## 2018-11-01 ENCOUNTER — Encounter: Payer: Self-pay | Admitting: Cardiology

## 2018-11-02 ENCOUNTER — Encounter (INDEPENDENT_AMBULATORY_CARE_PROVIDER_SITE_OTHER): Payer: Self-pay

## 2018-11-02 ENCOUNTER — Ambulatory Visit: Payer: BLUE CROSS/BLUE SHIELD | Admitting: Cardiology

## 2018-11-02 ENCOUNTER — Encounter: Payer: Self-pay | Admitting: Cardiology

## 2018-11-02 VITALS — BP 160/90 | HR 84 | Ht 67.0 in | Wt 217.0 lb

## 2018-11-02 DIAGNOSIS — E785 Hyperlipidemia, unspecified: Secondary | ICD-10-CM | POA: Diagnosis not present

## 2018-11-02 DIAGNOSIS — I2583 Coronary atherosclerosis due to lipid rich plaque: Secondary | ICD-10-CM

## 2018-11-02 DIAGNOSIS — Z951 Presence of aortocoronary bypass graft: Secondary | ICD-10-CM | POA: Diagnosis not present

## 2018-11-02 DIAGNOSIS — I2510 Atherosclerotic heart disease of native coronary artery without angina pectoris: Secondary | ICD-10-CM

## 2018-11-02 MED ORDER — ATORVASTATIN CALCIUM 80 MG PO TABS: 80.0000 mg | ORAL_TABLET | Freq: Every day | ORAL | 3 refills | Status: DC

## 2018-11-02 MED ORDER — CARVEDILOL 6.25 MG PO TABS: 6.2500 mg | ORAL_TABLET | Freq: Two times a day (BID) | ORAL | 3 refills | Status: DC

## 2018-11-02 NOTE — Patient Instructions (Signed)
Medication Instructions:  Your physician recommends that you continue on your current medications as directed. Please refer to the Current Medication list given to you today.  Labwork: None ordered.  Testing/Procedures: None ordered.  Follow-Up: Your physician recommends that you schedule a follow-up appointment in:   6 months with Dr Skains.  Any Other Special Instructions Will Be Listed Below (If Applicable).     If you need a refill on your cardiac medications before your next appointment, please call your pharmacy.  

## 2018-11-02 NOTE — Progress Notes (Signed)
Cardiology Office Note:    Date:  11/02/2018   ID:  Thomas Thomas, DOB Jan 27, 1956, MRN [id number]  PCP:  Remus Loffler, PA-C  Cardiologist:  Donato Schultz, MD  Electrophysiologist:  None   Referring MD: Junie Spencer, FNP     History of Present Illness:    Thomas Thomas is a 63 y.o. male here for the follow-up of coronary artery disease status post CABG for severe three-vessel coronary disease Dr. Maren Beach 01/02/2016.  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  Overall been doing quite well.  No fevers chills nausea vomiting syncope bleeding. A little winded. Sharp pain at times, MSK.  Behind left shoulder blade pain muscle spasm.  Told me story about his dermatologic ailment, saw allergist.  Doing very well now.  Improved.  Past Medical History:  Diagnosis Date  . Anxiety   . Diabetes mellitus type II, non insulin dependent (HCC) 11/2015  . Hypertension   . NSTEMI (non-ST elevated myocardial infarction) (HCC) 12/23/2015    Past Surgical History:  Procedure Laterality Date  . CARDIAC CATHETERIZATION N/A 12/25/2015   Procedure: Left Heart Cath and Coronary Angiography;  Surgeon: Peter M Swaziland, MD;  Location: [facility] INVASIVE CV LAB;  Service: Cardiovascular;  Laterality: N/A;  . CORONARY ARTERY BYPASS GRAFT N/A 01/02/2016   Procedure: CORONARY ARTERY BYPASS GRAFTING (CABG)X 4 LIMA-LAD; SVG-RCA(ENDARDERECTOMY); SVG-DIAG; SVG-OM ENDOSCOPIC GREATER RIGHT SAPHENOUS VEIN HARVEST;  Surgeon: Kerin Perna, MD; LIMA-LAD, SVG-D, SVG-OM, SVG-RCA-PDA    . HERNIA REPAIR    . KNEE SURGERY    . TEE WITHOUT CARDIOVERSION N/A 01/02/2016   Procedure: TRANSESOPHAGEAL ECHOCARDIOGRAM (TEE);  Surgeon: Kerin Perna, MD;  Location: [facility] OR;  Service: Open Heart Surgery;  Laterality: N/A;  . TONSILLECTOMY    . US ECHOCARDIOGRAPHY  12/24/2015   EF 55-60%, grade 1 dd    Current Medications: Current Meds  Medication Sig  . aspirin EC 81 MG EC tablet Take 1 tablet (81 mg total) by  mouth daily.  Marland Kitchen atorvastatin (LIPITOR) 80 MG tablet Take 1 tablet (80 mg total) by mouth daily.  . carvedilol (COREG) 6.25 MG tablet Take 1 tablet (6.25 mg total) by mouth 2 (two) times daily with a meal.  . lisinopril (PRINIVIL,ZESTRIL) 2.5 MG tablet Take 1 tablet (2.5 mg total) by mouth daily. Please make overdue appt with Dr. Anne Fu before anymore refills. 1st attempt  . sildenafil (REVATIO) 20 MG tablet Take 2-5 tabs PRN prior to sexual activity  . [DISCONTINUED] atorvastatin (LIPITOR) 80 MG tablet Take 1 tablet (80 mg total) by mouth daily.  . [DISCONTINUED] carvedilol (COREG) 6.25 MG tablet TAKE 1 TABLET BY MOUTH TWICE DAILY WITH A MEAL (PLEASE SCHEDULE FOLLOW UP APPOINTMENT WITH DR Anne Fu BEFORE FURTHER REFILLS)     Allergies:   Patient has no known allergies.   Social History   Socioeconomic History  . Marital status: Married    Spouse name: Not on file  . Number of children: Not on file  . Years of education: Not on file  . Highest education level: Not on file  Occupational History  . Not on file  Social Needs  . Financial resource strain: Not on file  . Food insecurity:    Worry: Not on file    Inability: Not on file  . Transportation needs:    Medical: Not on file    Non-medical: Not on file  Tobacco Use  . Smoking status: Former Smoker    Last attempt to  quit: 06/06/2012    Years since quitting: 6.4  . Smokeless tobacco: Never Used  Substance and Sexual Activity  . Alcohol use: No  . Drug use: No  . Sexual activity: Not on file  Lifestyle  . Physical activity:    Days per week: Not on file    Minutes per session: Not on file  . Stress: Not on file  Relationships  . Social connections:    Talks on phone: Not on file    Gets together: Not on file    Attends religious service: Not on file    Active member of club or organization: Not on file    Attends meetings of clubs or organizations: Not on file    Relationship status: Not on file  Other Topics Concern    . Not on file  Social History Narrative  . Not on file     Family History: The patient's family history includes Allergic rhinitis in his sister; Cancer in his father; Heart disease in his mother. There is no history of Asthma or Eczema.  ROS:   Please see the history of present illness.   Denies any fevers chills nausea vomiting syncope bleeding.  All other systems reviewed and are negative.  EKGs/Labs/Other Studies Reviewed:    The following studies were reviewed today:   EKG:  EKG is ordered today.  The ekg ordered today demonstrates normal sinus rhythm 84 nonspecific T wave changes.  Recent Labs: No results found for requested labs within last 8760 hours.  Recent Lipid Panel    Component Value Date/Time   CHOL 188 08/18/2017 1507   TRIG 55 08/18/2017 1507   HDL 39 (L) 08/18/2017 1507   CHOLHDL 4.8 08/18/2017 1507   CHOLHDL 3.7 12/24/2015 0020   VLDL 5 12/24/2015 0020   LDLCALC 138 (H) 08/18/2017 1507    Physical Exam:    VS:  BP (!) 160/90   Pulse 84   Ht 5\' 7"  (1.702 m)   Wt 217 lb (98.4 kg)   BMI 33.99 kg/m     Wt Readings from Last 3 Encounters:  11/02/18 217 lb (98.4 kg)  07/25/18 217 lb (98.4 kg)  06/16/18 216 lb (98 kg)     GEN:  Well nourished, well developed in no acute distress HEENT: Normal NECK: No JVD; No carotid bruits LYMPHATICS: No lymphadenopathy CARDIAC: RRR, no murmurs, rubs, gallops RESPIRATORY:  Clear to auscultation without rales, wheezing or rhonchi  ABDOMEN: Soft, non-tender, non-distended MUSCULOSKELETAL:  No edema; No deformity  SKIN: Warm and dry NEUROLOGIC:  Alert and oriented x 3 PSYCHIATRIC:  Normal affect   ASSESSMENT:    1. Coronary artery disease due to lipid rich plaque   2. S/P CABG x 4   3. Hyperlipidemia, unspecified hyperlipidemia type    PLAN:    In order of problems listed above:  Coronary artery disease post CABG -Overall continue with aggressive secondary risk factor prevention.  Doing very well.   Previously stopped his Plavix after did been 1 year past his non-STEMI.  Hyperlipidemia - Continue with high intensity statin use.  History of non-ST elevation myocardial infarction - Risk factor prevention.  Central hypertension -Elevated today but he has been under a lot of stress.  His truck, serpentine belt failed.  Medication Adjustments/Labs and Tests Ordered: Current medicines are reviewed at length with the patient today.  Concerns regarding medicines are outlined above.  Orders Placed This Encounter  Procedures  . EKG 12-Lead   Meds ordered this  encounter  Medications  . atorvastatin (LIPITOR) 80 MG tablet    Sig: Take 1 tablet (80 mg total) by mouth daily.    Dispense:  90 tablet    Refill:  3  . carvedilol (COREG) 6.25 MG tablet    Sig: Take 1 tablet (6.25 mg total) by mouth 2 (two) times daily with a meal.    Dispense:  180 tablet    Refill:  3    Please consider 90 day supplies to promote better adherence    Patient Instructions  Medication Instructions:  Your physician recommends that you continue on your current medications as directed. Please refer to the Current Medication list given to you today.  Labwork: None ordered.  Testing/Procedures: None ordered.  Follow-Up: Your physician recommends that you schedule a follow-up appointment in:   6 months with Dr Anne FuSkains  Any Other Special Instructions Will Be Listed Below (If Applicable).     If you need a refill on your cardiac medications before your next appointment, please call your pharmacy.     Signed, Donato SchultzMark Shoshana Johal, MD  11/02/2018 3:11 PM    [facility]

## 2018-11-04 ENCOUNTER — Other Ambulatory Visit: Payer: Self-pay | Admitting: Cardiology

## 2018-11-23 ENCOUNTER — Ambulatory Visit: Payer: BLUE CROSS/BLUE SHIELD | Admitting: Nurse Practitioner

## 2018-12-19 ENCOUNTER — Telehealth: Payer: Self-pay | Admitting: Physician Assistant

## 2018-12-19 NOTE — Telephone Encounter (Signed)
What symptoms do you have? Cough congestion sinus drainage sore throat he had a fever but it broke last night  How long have you been sick? Since Sat  Have you been seen for this problem? no  If your provider decides to give you a prescription, which pharmacy would you like for it to be sent to? [facility]   Patient informed that this information will be sent to the clinical staff for review and that they should receive a follow up call.

## 2018-12-19 NOTE — Telephone Encounter (Signed)
appt made to be seen  

## 2018-12-20 ENCOUNTER — Ambulatory Visit: Payer: BLUE CROSS/BLUE SHIELD | Admitting: Physician Assistant

## 2018-12-20 ENCOUNTER — Encounter: Payer: Self-pay | Admitting: Physician Assistant

## 2018-12-20 VITALS — BP 130/75 | HR 76 | Temp 97.5°F | Ht 67.0 in | Wt 215.0 lb

## 2018-12-20 DIAGNOSIS — J0110 Acute frontal sinusitis, unspecified: Secondary | ICD-10-CM

## 2018-12-20 MED ORDER — PREDNISONE 10 MG (21) PO TBPK: ORAL_TABLET | ORAL | 0 refills | Status: DC

## 2018-12-20 MED ORDER — AMOXICILLIN 500 MG PO CAPS: 1000.0000 mg | ORAL_CAPSULE | Freq: Two times a day (BID) | ORAL | 0 refills | Status: DC

## 2018-12-20 MED ORDER — SILDENAFIL CITRATE 20 MG PO TABS: ORAL_TABLET | ORAL | 3 refills | Status: DC

## 2018-12-20 NOTE — Progress Notes (Signed)
BP 130/75   Pulse 76   Temp (!) 97.5 F (36.4 C) (Oral)   Ht 5\' 7"  (1.702 m)   Wt 215 lb (97.5 kg)   BMI 33.67 kg/m    Subjective:    Patient ID: Thomas Thomas, male    DOB: 16-Jul-1956, 63 y.o.   MRN: [id number]  HPI: Thomas Thomas is a 63 y.o. male presenting on 12/20/2018 for Cough (started Saturday ); Nasal Congestion; and Fever  This patient has had many days of sinus headache and postnasal drainage. There is copious drainage at times. Denies any fever at this time. There has been a history of sinus infections in the past.  No history of sinus surgery. There is cough at night. It has become more prevalent in recent days.   Past Medical History:  Diagnosis Date  . Anxiety   . Diabetes mellitus type II, non insulin dependent (HCC) 11/2015  . Hypertension   . NSTEMI (non-ST elevated myocardial infarction) (HCC) 12/23/2015   Relevant past medical, surgical, family and social history reviewed and updated as indicated. Interim medical history since our last visit reviewed. Allergies and medications reviewed and updated. DATA REVIEWED: CHART IN EPIC  Family History reviewed for pertinent findings.  Review of Systems  Constitutional: Positive for fatigue. Negative for appetite change and fever.  HENT: Positive for congestion, sinus pressure, sinus pain and sore throat.   Eyes: Negative.  Negative for pain and visual disturbance.  Respiratory: Positive for cough. Negative for chest tightness, shortness of breath and wheezing.   Cardiovascular: Negative.  Negative for chest pain, palpitations and leg swelling.  Gastrointestinal: Negative.  Negative for abdominal pain, diarrhea, nausea and vomiting.  Endocrine: Negative.   Genitourinary: Negative.   Musculoskeletal: Positive for myalgias. Negative for back pain.  Skin: Negative.  Negative for color change and rash.  Neurological: Positive for headaches. Negative for weakness and numbness.  Psychiatric/Behavioral: Negative.      Allergies as of 12/20/2018   No Known Allergies     Medication List       Accurate as of December 20, 2018  8:57 AM. Always use your most recent med list.        amoxicillin 500 MG capsule Commonly known as:  AMOXIL Take 2 capsules (1,000 mg total) by mouth 2 (two) times daily.   aspirin 81 MG EC tablet Take 1 tablet (81 mg total) by mouth daily.   atorvastatin 80 MG tablet Commonly known as:  LIPITOR Take 1 tablet (80 mg total) by mouth daily.   carvedilol 6.25 MG tablet Commonly known as:  COREG Take 1 tablet (6.25 mg total) by mouth 2 (two) times daily with a meal.   lisinopril 2.5 MG tablet Commonly known as:  PRINIVIL,ZESTRIL TAKE 1 TABLET BY MOUTH ONCE DAILY PLEASE MAKE OVERDUE APPOINTMENT WITH DR. Anne Fu BEFORE ANYMORE REFILLS   predniSONE 10 MG (21) Tbpk tablet Commonly known as:  STERAPRED UNI-PAK 21 TAB As directed x 6 days   sildenafil 20 MG tablet Commonly known as:  REVATIO Take 2-5 tabs PRN prior to sexual activity          Objective:    BP 130/75   Pulse 76   Temp (!) 97.5 F (36.4 C) (Oral)   Ht 5\' 7"  (1.702 m)   Wt 215 lb (97.5 kg)   BMI 33.67 kg/m   No Known Allergies  Wt Readings from Last 3 Encounters:  12/20/18 215 lb (97.5 kg)  11/02/18 217 lb (98.4 kg)  07/25/18 217 lb (98.4 kg)    Physical Exam Constitutional:      Appearance: He is well-developed.  HENT:     Head: Normocephalic and atraumatic.     Right Ear: Tympanic membrane and external ear normal. No middle ear effusion.     Left Ear: Tympanic membrane and external ear normal.  No middle ear effusion.     Nose: Mucosal edema and rhinorrhea present.     Right Sinus: No maxillary sinus tenderness.     Left Sinus: Maxillary sinus tenderness and frontal sinus tenderness present.     Mouth/Throat:     Pharynx: Uvula midline. Posterior oropharyngeal erythema present.  Eyes:     General:        Right eye: No discharge.        Left eye: No discharge.      Conjunctiva/sclera: Conjunctivae normal.     Pupils: Pupils are equal, round, and reactive to light.  Neck:     Musculoskeletal: Normal range of motion.  Cardiovascular:     Rate and Rhythm: Normal rate and regular rhythm.     Heart sounds: Normal heart sounds.  Pulmonary:     Effort: Pulmonary effort is normal. No respiratory distress.     Breath sounds: Normal breath sounds. No wheezing.  Abdominal:     Palpations: Abdomen is soft.  Lymphadenopathy:     Cervical: No cervical adenopathy.  Skin:    General: Skin is warm and dry.  Neurological:     Mental Status: He is alert and oriented to person, place, and time.         Assessment & Plan:   1. Acute non-recurrent frontal sinusitis - amoxicillin (AMOXIL) 500 MG capsule; Take 2 capsules (1,000 mg total) by mouth 2 (two) times daily.  Dispense: 40 capsule; Refill: 0 - predniSONE (STERAPRED UNI-PAK 21 TAB) 10 MG (21) TBPK tablet; As directed x 6 days  Dispense: 21 tablet; Refill: 0   Continue all other maintenance medications as listed above.  Follow up plan: No follow-ups on file.  Educational handout given for survey  Remus Loffler PA-C [facility] [address]  [place], [place] [address] [phone]   12/20/2018, 8:57 AM

## 2019-08-09 ENCOUNTER — Encounter: Payer: Self-pay | Admitting: Cardiology

## 2019-08-09 ENCOUNTER — Ambulatory Visit: Payer: BC Managed Care – PPO | Admitting: Cardiology

## 2019-08-09 ENCOUNTER — Other Ambulatory Visit: Payer: Self-pay

## 2019-08-09 VITALS — BP 150/72 | HR 72 | Ht 67.0 in | Wt 216.0 lb

## 2019-08-09 DIAGNOSIS — E785 Hyperlipidemia, unspecified: Secondary | ICD-10-CM | POA: Diagnosis not present

## 2019-08-09 DIAGNOSIS — I2510 Atherosclerotic heart disease of native coronary artery without angina pectoris: Secondary | ICD-10-CM | POA: Diagnosis not present

## 2019-08-09 DIAGNOSIS — Z951 Presence of aortocoronary bypass graft: Secondary | ICD-10-CM | POA: Diagnosis not present

## 2019-08-09 DIAGNOSIS — I2583 Coronary atherosclerosis due to lipid rich plaque: Secondary | ICD-10-CM

## 2019-08-09 LAB — COMPREHENSIVE METABOLIC PANEL
ALT: 25 IU/L (ref 0–44)
AST: 19 IU/L (ref 0–40)
Albumin/Globulin Ratio: 1.6 (ref 1.2–2.2)
Albumin: 4.2 g/dL (ref 3.8–4.8)
Alkaline Phosphatase: 77 IU/L (ref 39–117)
BUN/Creatinine Ratio: 9 — ABNORMAL LOW (ref 10–24)
BUN: 10 mg/dL (ref 8–27)
Bilirubin Total: 0.5 mg/dL (ref 0.0–1.2)
CO2: 24 mmol/L (ref 20–29)
Calcium: 9.2 mg/dL (ref 8.6–10.2)
Chloride: 101 mmol/L (ref 96–106)
Creatinine, Ser: 1.16 mg/dL (ref 0.76–1.27)
GFR calc Af Amer: 77 mL/min/{1.73_m2} (ref >59)
GFR calc non Af Amer: 66 mL/min/{1.73_m2} (ref >59)
Globulin, Total: 2.6 g/dL (ref 1.5–4.5)
Glucose: 120 mg/dL — ABNORMAL HIGH (ref 65–99)
Potassium: 4.5 mmol/L (ref 3.5–5.2)
Sodium: 138 mmol/L (ref 134–144)
Total Protein: 6.8 g/dL (ref 6.0–8.5)

## 2019-08-09 LAB — CBC
Hematocrit: 43.7 % (ref 37.5–51.0)
Hemoglobin: 14.3 g/dL (ref 13.0–17.7)
MCH: 30 pg (ref 26.6–33.0)
MCHC: 32.7 g/dL (ref 31.5–35.7)
MCV: 92 fL (ref 79–97)
Platelets: 211 10*3/uL (ref 150–450)
RBC: 4.76 x10E6/uL (ref 4.14–5.80)
RDW: 12.9 % (ref 11.6–15.4)
WBC: 9.7 10*3/uL (ref 3.4–10.8)

## 2019-08-09 LAB — LIPID PANEL
Chol/HDL Ratio: 4.9 ratio (ref 0.0–5.0)
Cholesterol, Total: 192 mg/dL (ref 100–199)
HDL: 39 mg/dL — ABNORMAL LOW (ref >39)
LDL Chol Calc (NIH): 140 mg/dL — ABNORMAL HIGH (ref 0–99)
Triglycerides: 73 mg/dL (ref 0–149)
VLDL Cholesterol Cal: 13 mg/dL (ref 5–40)

## 2019-08-09 NOTE — Patient Instructions (Signed)
Medication Instructions:  No changes If you need a refill on your cardiac medications before your next appointment, please call your pharmacy.   Lab work: Today: lipids, cmet, cbc If you have labs (blood work) drawn today and your tests are completely normal, you will receive your results only by: Marland Kitchen MyChart Message (if you have MyChart) OR . A paper copy in the mail If you have any lab test that is abnormal or we need to change your treatment, we will call you to review the results.  Testing/Procedures: none  Follow-Up: At [facility], you and your health needs are our priority.  As part of our continuing mission to provide you with exceptional heart care, we have created designated Provider Care Teams.  These Care Teams include your primary Cardiologist (physician) and Advanced Practice Providers (APPs -  Physician Assistants and Nurse Practitioners) who all work together to provide you with the care you need, when you need it. You will need a follow up appointment in 12 years.  Please call our office 2 months in advance to schedule this appointment.  You may see Candee Furbish, MD or one of the following Advanced Practice Providers on your designated Care Team:   Truitt Merle, NP Cecilie Kicks, NP . Kathyrn Drown, NP  Any Other Special Instructions Will Be Listed Below (If Applicable).

## 2019-08-09 NOTE — Progress Notes (Signed)
Cardiology Office Note:    Date:  08/09/2019   ID:  Thomas Thomas, DOB [birth year], MRN [id number]  PCP:  Remus Loffler, PA-C  Cardiologist:  Donato Schultz, MD  Electrophysiologist:  None   Referring MD: Remus Loffler, PA-C     History of Present Illness:    Thomas Thomas is a 63 y.o. male here for the follow-up of coronary artery disease status post CABG for severe three-vessel coronary disease Dr. Maren Beach 01/02/2016.  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  Overall been doing quite well. A little winded. Sharp pain at times, MSK.  Behind left shoulder blade pain muscle spasm.  Told me story about his dermatologic ailment, saw allergist.  Doing very well now.  Improved.  Overall no significant changes.  He is not feel any chest pain fevers chills nausea vomiting syncope bleeding.  In charge of keeping plant sanitized. BP still a little high. Team lead sore throat, runny nose.   Past Medical History:  Diagnosis Date  . Anxiety   . Diabetes mellitus type II, non insulin dependent (HCC) 11/2015  . Hypertension   . NSTEMI (non-ST elevated myocardial infarction) (HCC) 12/23/2015    Past Surgical History:  Procedure Laterality Date  . CARDIAC CATHETERIZATION N/A 12/25/2015   Procedure: Left Heart Cath and Coronary Angiography;  Surgeon: Peter M Swaziland, MD;  Location: [facility] INVASIVE CV LAB;  Service: Cardiovascular;  Laterality: N/A;  . CORONARY ARTERY BYPASS GRAFT N/A 01/02/2016   Procedure: CORONARY ARTERY BYPASS GRAFTING (CABG)X 4 LIMA-LAD; SVG-RCA(ENDARDERECTOMY); SVG-DIAG; SVG-OM ENDOSCOPIC GREATER RIGHT SAPHENOUS VEIN HARVEST;  Surgeon: Kerin Perna, MD; LIMA-LAD, SVG-D, SVG-OM, SVG-RCA-PDA    . HERNIA REPAIR    . KNEE SURGERY    . TEE WITHOUT CARDIOVERSION N/A 01/02/2016   Procedure: TRANSESOPHAGEAL ECHOCARDIOGRAM (TEE);  Surgeon:  Kerin Perna, MD;  Location: [facility] OR;  Service: Open Heart Surgery;  Laterality: N/A;  . TONSILLECTOMY    . US ECHOCARDIOGRAPHY  12/24/2015   EF 55-60%, grade 1 dd    Current Medications: Current Meds  Medication Sig  . aspirin EC 81 MG EC tablet Take 1 tablet (81 mg total) by mouth daily.  Marland Kitchen atorvastatin (LIPITOR) 80 MG tablet Take 1 tablet (80 mg total) by mouth daily.  . carvedilol (COREG) 6.25 MG tablet Take 1 tablet (6.25 mg total) by mouth 2 (two) times daily with a meal.  . lisinopril (PRINIVIL,ZESTRIL) 2.5 MG tablet TAKE 1 TABLET BY MOUTH ONCE DAILY PLEASE MAKE OVERDUE APPOINTMENT WITH DR. Anne Fu BEFORE ANYMORE REFILLS  . sildenafil (REVATIO) 20 MG tablet Take 2-5 tabs PRN prior to sexual activity     Allergies:   Patient has no known allergies.   Social History   Socioeconomic History  . Marital status: Married    Spouse name: Not on file  . Number of children: Not on file  . Years of education: Not on file  . Highest education level: Not on file  Occupational History  . Not on file  Social Needs  . Financial resource strain: Not on file  . Food insecurity    Worry: Not on file    Inability: Not on file  . Transportation needs    Medical: Not on file    Non-medical: Not on file  Tobacco Use  . Smoking status: Former Engineer, civil (consulting)  date: 06/06/2012    Years since quitting: 7.1  . Smokeless tobacco: Never Used  Substance and Sexual Activity  . Alcohol use: No  . Drug use: No  . Sexual activity: Not on file  Lifestyle  . Physical activity    Days per week: Not on file    Minutes per session: Not on file  . Stress: Not on file  Relationships  . Social Herbalist on phone: Not on file    Gets together: Not on file    Attends religious service: Not on file    Active member of club or organization: Not on file    Attends meetings of clubs or organizations: Not on file    Relationship status: Not on file  Other Topics Concern  . Not on file   Social History Narrative  . Not on file     Family History: The patient's family history includes Allergic rhinitis in his sister; Cancer in his father; Heart disease in his mother. There is no history of Asthma or Eczema.  ROS:   Please see the history of present illness.   Denies any fevers chills nausea vomiting syncope bleeding.  All other systems reviewed and are negative.  EKGs/Labs/Other Studies Reviewed:    The following studies were reviewed today:   EKG: 11/02/2018-sinus rhythm nonspecific T wave changes, prior normal sinus rhythm 84 nonspecific T wave changes.  Recent Labs: No results found for requested labs within last 8760 hours.  Recent Lipid Panel    Component Value Date/Time   CHOL 188 08/18/2017 1507   TRIG 55 08/18/2017 1507   HDL 39 (L) 08/18/2017 1507   CHOLHDL 4.8 08/18/2017 1507   CHOLHDL 3.7 12/24/2015 0020   VLDL 5 12/24/2015 0020   LDLCALC 138 (H) 08/18/2017 1507    Physical Exam:    VS:  BP (!) 150/72   Pulse 72   Ht 5\' 7"  (1.702 m)   Wt 216 lb (98 kg)   SpO2 96%   BMI 33.83 kg/m     Wt Readings from Last 3 Encounters:  08/09/19 216 lb (98 kg)  12/20/18 215 lb (97.5 kg)  11/02/18 217 lb (98.4 kg)    GEN: Well nourished, well developed, in no acute distress  HEENT: normal overweight Neck: no JVD, carotid bruits, or masses Cardiac: RRR; no murmurs, rubs, or gallops,no edema  Respiratory:  clear to auscultation bilaterally, normal work of breathing GI: soft, nontender, nondistended, + BS MS: no deformity or atrophy  Skin: warm and dry, no rash Neuro:  Alert and Oriented x 3, Strength and sensation are intact Psych: euthymic mood, full affect   ASSESSMENT:    1. Coronary artery disease due to lipid rich plaque   2. S/P CABG x 4   3. Atherosclerosis of native coronary artery of native heart without angina pectoris   4. Hyperlipidemia, unspecified hyperlipidemia type    PLAN:    In order of problems listed above:  Coronary  artery disease post CABG 2017 -Overall continue with aggressive secondary risk factor prevention.   Previously stopped his Plavix after did been 1 year past his non-STEMI.  Does not seem to be having any anginal symptoms.  Excellent.  He did have some mild left-sided chest discomfort for about a week after Covid started.  He change his diet and it went away.  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  This goes away after he takes his bag off.  Musculoskeletal.  Nerve.  Hyperlipidemia - Continue with high intensity statin use.  Prevention secondary.  We will go ahead and check labs.  History of non-ST elevation myocardial infarction - Risk factor prevention.  No further recurrences.  Essential hypertension -At last visit he was under quite a bit of stress since his trucks serpentine belt failed.  His blood pressure was elevated at the time.  Still mildly elevated today.  Prior degree of whitecoat hypertension.  Making sure that he is taking his medications.  Medication Adjustments/Labs and Tests Ordered: Current medicines are reviewed at length with the patient today.  Concerns regarding medicines are outlined above.  Orders Placed This Encounter  Procedures  . Lipid panel  . Comprehensive metabolic panel  . CBC   No orders of the defined types were placed in this encounter.   Patient Instructions  Medication Instructions:  No changes If you need a refill on your cardiac medications before your next appointment, please call your pharmacy.   Lab work: Today: lipids, cmet, cbc If you have labs (blood work) drawn today and your tests are completely normal, you will receive your results only by: Marland Kitchen. MyChart Message (if you have MyChart) OR . A paper copy in the mail If you have any lab test that is abnormal or we need to change your treatment, we will call you to review the results.  Testing/Procedures: none   Follow-Up: At [facility], you and your health needs are our priority.  As part of our continuing mission to provide you with exceptional heart care, we have created designated Provider Care Teams.  These Care Teams include your primary Cardiologist (physician) and Advanced Practice Providers (APPs -  Physician Assistants and Nurse Practitioners) who all work together to provide you with the care you need, when you need it. You will need a follow up appointment in 12 years.  Please call our office 2 months in advance to schedule this appointment.  You may see Donato SchultzMark Yusef Lamp, MD or one of the following Advanced Practice Providers on your designated Care Team:   Norma FredricksonLori Gerhardt, NP Nada BoozerLaura Ingold, NP . Georgie ChardJill McDaniel, NP  Any Other Special Instructions Will Be Listed Below (If Applicable).       Signed, Donato SchultzMark Courteny Egler, MD  08/09/2019 9:16 AM    [facility]

## 2019-08-11 ENCOUNTER — Other Ambulatory Visit: Payer: Self-pay | Admitting: *Deleted

## 2019-08-11 DIAGNOSIS — E785 Hyperlipidemia, unspecified: Secondary | ICD-10-CM

## 2019-08-11 DIAGNOSIS — Z79899 Other long term (current) drug therapy: Secondary | ICD-10-CM

## 2019-08-11 MED ORDER — ROSUVASTATIN CALCIUM 20 MG PO TABS: 20.0000 mg | ORAL_TABLET | Freq: Every day | ORAL | 3 refills | Status: DC

## 2019-11-06 ENCOUNTER — Other Ambulatory Visit: Payer: Self-pay | Admitting: Cardiology

## 2019-11-10 ENCOUNTER — Other Ambulatory Visit: Payer: BC Managed Care – PPO

## 2019-11-27 ENCOUNTER — Other Ambulatory Visit: Payer: Self-pay

## 2019-11-27 ENCOUNTER — Other Ambulatory Visit: Payer: BC Managed Care – PPO | Admitting: *Deleted

## 2019-11-27 DIAGNOSIS — Z79899 Other long term (current) drug therapy: Secondary | ICD-10-CM

## 2019-11-27 DIAGNOSIS — E785 Hyperlipidemia, unspecified: Secondary | ICD-10-CM | POA: Diagnosis not present

## 2019-11-27 LAB — LIPID PANEL
Chol/HDL Ratio: 2.4 ratio (ref 0.0–5.0)
Cholesterol, Total: 95 mg/dL — ABNORMAL LOW (ref 100–199)
HDL: 39 mg/dL — ABNORMAL LOW (ref >39)
LDL Chol Calc (NIH): 43 mg/dL (ref 0–99)
Triglycerides: 55 mg/dL (ref 0–149)
VLDL Cholesterol Cal: 13 mg/dL (ref 5–40)

## 2019-11-27 LAB — ALT: ALT: 13 IU/L (ref 0–44)

## 2019-11-28 ENCOUNTER — Telehealth: Payer: Self-pay | Admitting: Cardiology

## 2019-11-28 NOTE — Telephone Encounter (Signed)
The patient has been notified of the result and verbalized understanding.  All questions (if any) were answered. Loa Socks, LPN 12/26/6710 4:58 PM

## 2019-11-28 NOTE — Telephone Encounter (Signed)
Thomas Bathe, MD  11/28/2019 7:13 AM EST    LDL 43 from 140. Excellent results. Prevention.  Continue Thomas Schultz, MD    Attempted to call the pt back and no answer, and his VM is still full. 2nd attempt at trying to make contact with the pt, to endorse lab results per Dr. Anne Thomas.

## 2019-11-28 NOTE — Telephone Encounter (Signed)
  Patient is returning call for lab results 

## 2020-02-19 ENCOUNTER — Ambulatory Visit: Payer: BLUE CROSS/BLUE SHIELD | Admitting: Family Medicine

## 2020-03-06 ENCOUNTER — Ambulatory Visit: Payer: BLUE CROSS/BLUE SHIELD | Admitting: Family Medicine

## 2020-03-15 DIAGNOSIS — Z23 Encounter for immunization: Secondary | ICD-10-CM | POA: Diagnosis not present

## 2020-04-12 DIAGNOSIS — Z23 Encounter for immunization: Secondary | ICD-10-CM | POA: Diagnosis not present

## 2020-08-24 ENCOUNTER — Other Ambulatory Visit: Payer: Self-pay | Admitting: Cardiology

## 2020-08-30 ENCOUNTER — Encounter: Payer: Self-pay | Admitting: Cardiology

## 2020-08-30 ENCOUNTER — Ambulatory Visit: Payer: BC Managed Care – PPO | Admitting: Cardiology

## 2020-08-30 ENCOUNTER — Other Ambulatory Visit: Payer: Self-pay

## 2020-08-30 VITALS — BP 150/80 | HR 63 | Ht 67.0 in | Wt 211.0 lb

## 2020-08-30 DIAGNOSIS — I2583 Coronary atherosclerosis due to lipid rich plaque: Secondary | ICD-10-CM

## 2020-08-30 DIAGNOSIS — Z79899 Other long term (current) drug therapy: Secondary | ICD-10-CM

## 2020-08-30 DIAGNOSIS — I2510 Atherosclerotic heart disease of native coronary artery without angina pectoris: Secondary | ICD-10-CM

## 2020-08-30 DIAGNOSIS — E785 Hyperlipidemia, unspecified: Secondary | ICD-10-CM | POA: Diagnosis not present

## 2020-08-30 MED ORDER — LISINOPRIL 20 MG PO TABS: 20.0000 mg | ORAL_TABLET | Freq: Every day | ORAL | 3 refills | Status: DC

## 2020-08-30 NOTE — Patient Instructions (Signed)
Medication Instructions:  Your physician has recommended you make the following change in your medication:  1.  INCREASE the Lisinopril to 20 mg taking 1 daily  *If you need a refill on your cardiac medications before your next appointment, please call your pharmacy*   Lab Work: None ordered  If you have labs (blood work) drawn today and your tests are completely normal, you will receive your results only by: Marland Kitchen MyChart Message (if you have MyChart) OR . A paper copy in the mail If you have any lab test that is abnormal or we need to change your treatment, we will call you to review the results.   Testing/Procedures: None ordered   Follow-Up: At [facility], you and your health needs are our priority.  As part of our continuing mission to provide you with exceptional heart care, we have created designated Provider Care Teams.  These Care Teams include your primary Cardiologist (physician) and Advanced Practice Providers (APPs -  Physician Assistants and Nurse Practitioners) who all work together to provide you with the care you need, when you need it.  We recommend signing up for the patient portal called "MyChart".  Sign up information is provided on this After Visit Summary.  MyChart is used to connect with patients for Virtual Visits (Telemedicine).  Patients are able to view lab/test results, encounter notes, upcoming appointments, etc.  Non-urgent messages can be sent to your provider as well.   To learn more about what you can do with MyChart, go to [email or web address].    Your next appointment:   2 month(s)   10/30/2020 ARRIVE AT 8:45   The format for your next appointment:   In Person  Provider:   Donato Schultz, MD   Other Instructions

## 2020-08-30 NOTE — Progress Notes (Signed)
Cardiology Office Note:    Date:  08/30/2020   ID:  Thomas Thomas, DOB [birth year], MRN [id number]  PCP:  Remus Loffler, PA-C  [facility] Cardiologist:  Donato Schultz, MD  [facility] Electrophysiologist:  None   Referring MD: Remus Loffler, PA-C     History of Present Illness:    Thomas Thomas is a 64 y.o. 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  Works at [employer].  At times he can be a little bit winded with activity, occasional sharp atypical pain.  Doing well without any fevers chills nausea vomiting syncope bleeding.  He is in charge of keeping the plan sanitize.  Past Medical History:  Diagnosis Date  . Anxiety   . Diabetes mellitus type II, non insulin dependent (HCC) 11/2015  . Hypertension   . NSTEMI (non-ST elevated myocardial infarction) (HCC) 12/23/2015    Past Surgical History:  Procedure Laterality Date  . CARDIAC CATHETERIZATION N/A 12/25/2015   Procedure: Left Heart Cath and Coronary Angiography;  Surgeon: Peter M Swaziland, MD;  Location: [facility] INVASIVE CV LAB;  Service: Cardiovascular;  Laterality: N/A;  . CORONARY ARTERY BYPASS GRAFT N/A 01/02/2016   Procedure: CORONARY ARTERY BYPASS GRAFTING (CABG)X 4 LIMA-LAD; SVG-RCA(ENDARDERECTOMY); SVG-DIAG; SVG-OM ENDOSCOPIC GREATER RIGHT SAPHENOUS VEIN HARVEST;  Surgeon: Kerin Perna, MD; LIMA-LAD, SVG-D, SVG-OM, SVG-RCA-PDA    . HERNIA REPAIR    . KNEE SURGERY    . TEE WITHOUT CARDIOVERSION N/A 01/02/2016   Procedure: TRANSESOPHAGEAL ECHOCARDIOGRAM (TEE);  Surgeon: Kerin Perna, MD;  Location: [facility] OR;  Service: Open Heart Surgery;  Laterality: N/A;  . TONSILLECTOMY    . US ECHOCARDIOGRAPHY  12/24/2015   EF 55-60%, grade 1 dd    Current Medications: Current Meds  Medication Sig  . aspirin EC 81 MG EC tablet Take 1 tablet (81 mg total) by mouth daily.  . carvedilol (COREG) 6.25 MG  tablet Take 1 tablet (6.25 mg total) by mouth 2 (two) times daily with a meal.  . rosuvastatin (CRESTOR) 20 MG tablet Take 1 tablet by mouth once daily  . sildenafil (REVATIO) 20 MG tablet Take 2-5 tabs PRN prior to sexual activity  . [DISCONTINUED] lisinopril (ZESTRIL) 2.5 MG tablet Take 1 tablet by mouth once daily     Allergies:   Patient has no known allergies.   Social History   Socioeconomic History  . Marital status: Married    Spouse name: Not on file  . Number of children: Not on file  . Years of education: Not on file  . Highest education level: Not on file  Occupational History  . Not on file  Tobacco Use  . Smoking status: Former Smoker    Quit date: 06/06/2012    Years since quitting: 8.2  . Smokeless tobacco: Never Used  Vaping Use  . Vaping Use: Never used  Substance and Sexual Activity  . Alcohol use: No  . Drug use: No  . Sexual activity: Not on file  Other Topics Concern  . Not on file  Social History Narrative  . Not on file   Social Determinants of Health   Financial Resource Strain:   . Difficulty of Paying Living Expenses: Not on file  Food Insecurity:   . Worried About Programme researcher, broadcasting/film/video in the Last Year: Not on file  . Ran Out of Food in the Last Year: Not on  file  Transportation Needs:   . Freight forwarder (Medical): Not on file  . Lack of Transportation (Non-Medical): Not on file  Physical Activity:   . Days of Exercise per Week: Not on file  . Minutes of Exercise per Session: Not on file  Stress:   . Feeling of Stress : Not on file  Social Connections:   . Frequency of Communication with Friends and Family: Not on file  . Frequency of Social Gatherings with Friends and Family: Not on file  . Attends Religious Services: Not on file  . Active Member of Clubs or Organizations: Not on file  . Attends Banker Meetings: Not on file  . Marital Status: Not on file     Family History: The patient's family history includes  Allergic rhinitis in his sister; Cancer in his father; Heart disease in his mother. There is no history of Asthma or Eczema.  ROS:   Please see the history of present illness.    Denies any fevers chills nausea vomiting syncope bleeding all other systems reviewed and are negative.  EKGs/Labs/Other Studies Reviewed:     EKG:  EKG is  ordered today.  The ekg ordered today demonstrates sinus rhythm 63 with nonspecific T wave changes.  No significant change from prior.  Recent Labs: 11/27/2019: ALT 13  Recent Lipid Panel    Component Value Date/Time   CHOL 95 (L) 11/27/2019 0738   TRIG 55 11/27/2019 0738   HDL 39 (L) 11/27/2019 0738   CHOLHDL 2.4 11/27/2019 0738   CHOLHDL 3.7 12/24/2015 0020   VLDL 5 12/24/2015 0020   LDLCALC 43 11/27/2019 0738     Risk Assessment/Calculations:      Physical Exam:    VS:  BP (!) 150/80   Pulse 63   Ht 5\' 7"  (1.702 m)   Wt 211 lb (95.7 kg)   SpO2 96%   BMI 33.05 kg/m     Wt Readings from Last 3 Encounters:  08/30/20 211 lb (95.7 kg)  08/09/19 216 lb (98 kg)  12/20/18 215 lb (97.5 kg)     GEN:  Well nourished, well developed in no acute distress HEENT: Normal NECK: No JVD; No carotid bruits LYMPHATICS: No lymphadenopathy CARDIAC: RRR, no murmurs, rubs, gallops RESPIRATORY:  Clear to auscultation without rales, wheezing or rhonchi  ABDOMEN: Soft, non-tender, non-distended MUSCULOSKELETAL:  No edema; No deformity  SKIN: Warm and dry NEUROLOGIC:  Alert and oriented x 3 PSYCHIATRIC:  Normal affect   ASSESSMENT:    1. Hyperlipidemia, unspecified hyperlipidemia type   2. Long-term use of high-risk medication   3. Coronary artery disease due to lipid rich plaque    PLAN:    In order of problems listed above:  Coronary artery disease status post CABG 2017 -Doing very well.  On aspirin lisinopril carvedilol Crestor high intensity dose.  No changes made.  Hyperlipidemia -Continue with high intensity Crestor 20 mg.  LDL goal is  less than 70.  His last check was 43 on 11/27/2019 from outside labs.  Triglycerides were 55.  Hemoglobin 14.3 creatinine 1.16 potassium 4.5 ALT 13 demonstrating normal liver function.  Excellent.  Essential hypertension -Has been a little bit high.  Has been under increased stress.  Does have a prior degree of whitecoat hypertension.  Make sure that he monitors his blood pressures regularly. -Even at home however his blood pressures at times are in the 140s to 150s.  We will go ahead and increase his lisinopril from 2.5  mg once a day up to 20 mg once a day. -I will see him back in 2 months with blood pressure readings.    Shared Decision Making/Informed Consent      Medication Adjustments/Labs and Tests Ordered: Current medicines are reviewed at length with the patient today.  Concerns regarding medicines are outlined above.  Orders Placed This Encounter  Procedures  . EKG 12-Lead   Meds ordered this encounter  Medications  . lisinopril (ZESTRIL) 20 MG tablet    Sig: Take 1 tablet (20 mg total) by mouth daily.    Dispense:  90 tablet    Refill:  3    Patient Instructions  Medication Instructions:  Your physician has recommended you make the following change in your medication:  1.  INCREASE the Lisinopril to 20 mg taking 1 daily  *If you need a refill on your cardiac medications before your next appointment, please call your pharmacy*   Lab Work: None ordered  If you have labs (blood work) drawn today and your tests are completely normal, you will receive your results only by: Marland Kitchen MyChart Message (if you have MyChart) OR . A paper copy in the mail If you have any lab test that is abnormal or we need to change your treatment, we will call you to review the results.   Testing/Procedures: None ordered   Follow-Up: At [facility], you and your health needs are our priority.  As part of our continuing mission to provide you with exceptional heart care, we have created  designated Provider Care Teams.  These Care Teams include your primary Cardiologist (physician) and Advanced Practice Providers (APPs -  Physician Assistants and Nurse Practitioners) who all work together to provide you with the care you need, when you need it.  We recommend signing up for the patient portal called "MyChart".  Sign up information is provided on this After Visit Summary.  MyChart is used to connect with patients for Virtual Visits (Telemedicine).  Patients are able to view lab/test results, encounter notes, upcoming appointments, etc.  Non-urgent messages can be sent to your provider as well.   To learn more about what you can do with MyChart, go to [email or web address].    Your next appointment:   2 month(s)   10/30/2020 ARRIVE AT 8:45   The format for your next appointment:   In Person  Provider:   Donato Schultz, MD   Other Instructions      Signed, Donato Schultz, MD  08/30/2020 9:21 AM    [facility]

## 2020-09-16 DIAGNOSIS — Z23 Encounter for immunization: Secondary | ICD-10-CM | POA: Diagnosis not present

## 2020-09-20 ENCOUNTER — Other Ambulatory Visit: Payer: Self-pay | Admitting: Cardiology

## 2020-10-30 ENCOUNTER — Ambulatory Visit: Payer: BC Managed Care – PPO | Admitting: Cardiology

## 2020-11-06 ENCOUNTER — Encounter: Payer: Self-pay | Admitting: Family Medicine

## 2020-11-06 ENCOUNTER — Other Ambulatory Visit: Payer: Self-pay

## 2020-11-06 ENCOUNTER — Ambulatory Visit: Payer: BC Managed Care – PPO | Admitting: Family Medicine

## 2020-11-06 VITALS — BP 153/82 | HR 85 | Temp 98.5°F | Ht 67.0 in | Wt 208.5 lb

## 2020-11-06 DIAGNOSIS — N529 Male erectile dysfunction, unspecified: Secondary | ICD-10-CM

## 2020-11-06 DIAGNOSIS — Z6832 Body mass index (BMI) 32.0-32.9, adult: Secondary | ICD-10-CM | POA: Diagnosis not present

## 2020-11-06 DIAGNOSIS — E8881 Metabolic syndrome: Secondary | ICD-10-CM | POA: Diagnosis not present

## 2020-11-06 DIAGNOSIS — M25552 Pain in left hip: Secondary | ICD-10-CM | POA: Diagnosis not present

## 2020-11-06 DIAGNOSIS — I509 Heart failure, unspecified: Secondary | ICD-10-CM | POA: Diagnosis not present

## 2020-11-06 DIAGNOSIS — I10 Essential (primary) hypertension: Secondary | ICD-10-CM

## 2020-11-06 DIAGNOSIS — I2510 Atherosclerotic heart disease of native coronary artery without angina pectoris: Secondary | ICD-10-CM

## 2020-11-06 DIAGNOSIS — I2583 Coronary atherosclerosis due to lipid rich plaque: Secondary | ICD-10-CM

## 2020-11-06 MED ORDER — METHYLPREDNISOLONE ACETATE 80 MG/ML IJ SUSP
80.0000 mg | Freq: Once | INTRAMUSCULAR | Status: AC
  Administered 2020-11-06: 80 mg via INTRAMUSCULAR

## 2020-11-06 MED ORDER — PREDNISONE 20 MG PO TABS: ORAL_TABLET | ORAL | 0 refills | Status: DC

## 2020-11-06 MED ORDER — SILDENAFIL CITRATE 20 MG PO TABS: ORAL_TABLET | ORAL | 3 refills | Status: DC

## 2020-11-06 NOTE — Patient Instructions (Signed)
Heart-Healthy Eating Plan Many factors influence your heart (coronary) health, including eating and exercise habits. Coronary risk increases with abnormal blood fat (lipid) levels. Heart-healthy meal planning includes limiting unhealthy fats, increasing healthy fats, and making other diet and lifestyle changes. What is my plan? Your health care provider may recommend that you:  Limit your fat intake to _________% or less of your total calories each day.  Limit your saturated fat intake to _________% or less of your total calories each day.  Limit the amount of cholesterol in your diet to less than _________ mg per day. What are tips for following this plan? Cooking Cook foods using methods other than frying. Baking, boiling, grilling, and broiling are all good options. Other ways to reduce fat include:  Removing the skin from poultry.  Removing all visible fats from meats.  Steaming vegetables in water or broth. Meal planning  At meals, imagine dividing your plate into fourths: ? Fill one-half of your plate with vegetables and green salads. ? Fill one-fourth of your plate with whole grains. ? Fill one-fourth of your plate with lean protein foods.  Eat 4-5 servings of vegetables per day. One serving equals 1 cup raw or cooked vegetable, or 2 cups raw leafy greens.  Eat 4-5 servings of fruit per day. One serving equals 1 medium whole fruit,  cup dried fruit,  cup fresh, frozen, or canned fruit, or  cup 100% fruit juice.  Eat more foods that contain soluble fiber. Examples include apples, broccoli, carrots, beans, peas, and barley. Aim to get 25-30 g of fiber per day.  Increase your consumption of legumes, nuts, and seeds to 4-5 servings per week. One serving of dried beans or legumes equals  cup cooked, 1 serving of nuts is  cup, and 1 serving of seeds equals 1 tablespoon.   Fats  Choose healthy fats more often. Choose monounsaturated and polyunsaturated fats, such as olive and  canola oils, flaxseeds, walnuts, almonds, and seeds.  Eat more omega-3 fats. Choose salmon, mackerel, sardines, tuna, flaxseed oil, and ground flaxseeds. Aim to eat fish at least 2 times each week.  Check food labels carefully to identify foods with trans fats or high amounts of saturated fat.  Limit saturated fats. These are found in animal products, such as meats, butter, and cream. Plant sources of saturated fats include palm oil, palm kernel oil, and coconut oil.  Avoid foods with partially hydrogenated oils in them. These contain trans fats. Examples are stick margarine, some tub margarines, cookies, crackers, and other baked goods.  Avoid fried foods. General information  Eat more home-cooked food and less restaurant, buffet, and fast food.  Limit or avoid alcohol.  Limit foods that are high in starch and sugar.  Lose weight if you are overweight. Losing just 5-10% of your body weight can help your overall health and prevent diseases such as diabetes and heart disease.  Monitor your salt (sodium) intake, especially if you have high blood pressure. Talk with your health care provider about your sodium intake.  Try to incorporate more vegetarian meals weekly. What foods can I eat? Fruits All fresh, canned (in natural juice), or frozen fruits. Vegetables Fresh or frozen vegetables (raw, steamed, roasted, or grilled). Green salads. Grains Most grains. Choose whole wheat and whole grains most of the time. Rice and pasta, including brown rice and pastas made with whole wheat. Meats and other proteins Lean, well-trimmed beef, veal, pork, and lamb. Chicken and turkey without skin. All fish and shellfish.   Wild duck, rabbit, pheasant, and venison. Egg whites or low-cholesterol egg substitutes. Dried beans, peas, lentils, and tofu. Seeds and most nuts. Dairy Low-fat or nonfat cheeses, including ricotta and mozzarella. Skim or 1% milk (liquid, powdered, or evaporated). Buttermilk made  with low-fat milk. Nonfat or low-fat yogurt. Fats and oils Non-hydrogenated (trans-free) margarines. Vegetable oils, including soybean, sesame, sunflower, olive, peanut, safflower, corn, canola, and cottonseed. Salad dressings or mayonnaise made with a vegetable oil. Beverages Water (mineral or sparkling). Coffee and tea. Diet carbonated beverages. Sweets and desserts Sherbet, gelatin, and fruit ice. Small amounts of dark chocolate. Limit all sweets and desserts. Seasonings and condiments All seasonings and condiments. The items listed above may not be a complete list of foods and beverages you can eat. Contact a dietitian for more options. What foods are not recommended? Fruits Canned fruit in heavy syrup. Fruit in cream or butter sauce. Fried fruit. Limit coconut. Vegetables Vegetables cooked in cheese, cream, or butter sauce. Fried vegetables. Grains Breads made with saturated or trans fats, oils, or whole milk. Croissants. Sweet rolls. Donuts. High-fat crackers, such as cheese crackers. Meats and other proteins Fatty meats, such as hot dogs, ribs, sausage, bacon, rib-eye roast or steak. High-fat deli meats, such as salami and bologna. Caviar. Domestic duck and goose. Organ meats, such as liver. Dairy Cream, sour cream, cream cheese, and creamed cottage cheese. Whole milk cheeses. Whole or 2% milk (liquid, evaporated, or condensed). Whole buttermilk. Cream sauce or high-fat cheese sauce. Whole-milk yogurt. Fats and oils Meat fat, or shortening. Cocoa butter, hydrogenated oils, palm oil, coconut oil, palm kernel oil. Solid fats and shortenings, including bacon fat, salt pork, lard, and butter. Nondairy cream substitutes. Salad dressings with cheese or sour cream. Beverages Regular sodas and any drinks with added sugar. Sweets and desserts Frosting. Pudding. Cookies. Cakes. Pies. Milk chocolate or white chocolate. Buttered syrups. Full-fat ice cream or ice cream drinks. The items listed  above may not be a complete list of foods and beverages to avoid. Contact a dietitian for more information. Summary  Heart-healthy meal planning includes limiting unhealthy fats, increasing healthy fats, and making other diet and lifestyle changes.  Lose weight if you are overweight. Losing just 5-10% of your body weight can help your overall health and prevent diseases such as diabetes and heart disease.  Focus on eating a balance of foods, including fruits and vegetables, low-fat or nonfat dairy, lean protein, nuts and legumes, whole grains, and heart-healthy oils and fats. This information is not intended to replace advice given to you by your health care provider. Make sure you discuss any questions you have with your health care provider. Document Revised: 11/19/2017 Document Reviewed: 11/19/2017 Elsevier Patient Education  2021 [employer] Hip Pain The hip is the joint between the upper legs and the lower pelvis. The bones, cartilage, tendons, and muscles of your hip joint support your body and allow you to move around. Hip pain can range from a minor ache to severe pain in one or both of your hips. The pain may be felt on the inside of the hip joint near the groin, or on the outside near the buttocks and upper thigh. You may also have swelling or stiffness in your hip area. Follow these instructions at home: Managing pain, stiffness, and swelling  If directed, put ice on the painful area. To do this: ? Put ice in a plastic bag. ? Place a towel between your skin and the bag. ? Leave the ice on for  20 minutes, 2-3 times a day.  If directed, apply heat to the affected area as often as told by your health care provider. Use the heat source that your health care provider recommends, such as a moist heat pack or a heating pad. ? Place a towel between your skin and the heat source. ? Leave the heat on for 20-30 minutes. ? Remove the heat if your skin turns bright red. This is especially  important if you are unable to feel pain, heat, or cold. You may have a greater risk of getting burned.      Activity  Do exercises as told by your health care provider.  Avoid activities that cause pain. General instructions  Take over-the-counter and prescription medicines only as told by your health care provider.  Keep a journal of your symptoms. Write down: ? How often you have hip pain. ? The location of your pain. ? What the pain feels like. ? What makes the pain worse.  Sleep with a pillow between your legs on your most comfortable side.  Keep all follow-up visits as told by your health care provider. This is important.   Contact a health care provider if:  You cannot put weight on your leg.  Your pain or swelling continues or gets worse after one week.  It gets harder to walk.  You have a fever. Get help right away if:  You fall.  You have a sudden increase in pain and swelling in your hip.  Your hip is red or swollen or very tender to touch. Summary  Hip pain can range from a minor ache to severe pain in one or both of your hips.  The pain may be felt on the inside of the hip joint near the groin, or on the outside near the buttocks and upper thigh.  Avoid activities that cause pain.  Write down how often you have hip pain, the location of the pain, what makes it worse, and what it feels like. This information is not intended to replace advice given to you by your health care provider. Make sure you discuss any questions you have with your health care provider. Document Revised: 02/27/2019 Document Reviewed: 02/27/2019 Elsevier Patient Education  2021 [employer].

## 2020-11-06 NOTE — Progress Notes (Signed)
Established Patient Office Visit  Subjective:  Patient ID: Thomas Thomas, male    DOB: 05/29/1956  Age: 65 y.o. MRN: [id number]  CC:  Chief Complaint  Patient presents with  . Hip Pain    HPI Thomas Thomas presents for chronic follow up. He has one new concern today.  1. Left hip pain Thomas Thomas reports left hip pain after sleeping on a lumpy couch during a camping trip about a week ago. The pain has gotten worse but he has been up moving around more. He has been using a can due to the pain this week. The pain feels like a pinching sensation that is constant. Sitting upright helps to relieve the pain. The pain is worse with movement. He did have some swelling but reports improvement. He has not tried ice with a little relief. He has not tried any medications because he prefers not to take medications. Denies numbness or tingling in his feet, changes in bowel or bladder control, or fever. Denies injury or fall.    2. HTN Complaint with meds - Yes Current Medications - Coreg, lisinopril Checking BP at home ranging: 130s/70s Exercising Regularly - stays active Watching Salt intake - Yes Pertinent ROS:  Headache - No Fatigue - No Visual Disturbances - No Chest pain - No Dyspnea - No Palpitations - No LE edema - No They report good compliance with medications and can restate their regimen by memory. No medication side effects.  Family, social, and smoking history reviewed.   BP Readings from Last 3 Encounters:  11/06/20 (!) 153/82  08/30/20 (!) 150/80  08/09/19 (!) 150/72   CMP Latest Ref Rng & Units 11/27/2019 08/09/2019 08/18/2017  Glucose 65 - 99 mg/dL - 409(W120(H) 119(J103(H)  BUN 8 - 27 mg/dL - 10 16  Creatinine 4.780.76 - 1.27 mg/dL - 2.951.16 6.211.05  Sodium 308134 - 144 mmol/L - 138 137  Potassium 3.5 - 5.2 mmol/L - 4.5 5.0  Chloride 96 - 106 mmol/L - 101 98  CO2 20 - 29 mmol/L - 24 25  Calcium 8.6 - 10.2 mg/dL - 9.2 9.7  Total Protein 6.0 - 8.5 g/dL - 6.8 7.3  Total Bilirubin 0.0 - 1.2  mg/dL - 0.5 0.3  Alkaline Phos 39 - 117 IU/L - 77 67  AST 0 - 40 IU/L - 19 14  ALT 0 - 44 IU/L 13 25 23        Past Medical History:  Diagnosis Date  . Anxiety   . Diabetes mellitus type II, non insulin dependent (HCC) 11/2015  . Hypertension   . NSTEMI (non-ST elevated myocardial infarction) (HCC) 12/23/2015    Past Surgical History:  Procedure Laterality Date  . CARDIAC CATHETERIZATION N/A 12/25/2015   Procedure: Left Heart Cath and Coronary Angiography;  Surgeon: Peter M SwazilandJordan, MD;  [facility];  Service: Cardiovascular;  Laterality: N/A;  . CORONARY ARTERY BYPASS GRAFT N/A 01/02/2016   Procedure: CORONARY ARTERY BYPASS GRAFTING (CABG)X 4 LIMA-LAD; SVG-RCA(ENDARDERECTOMY); SVG-DIAG; SVG-OM ENDOSCOPIC GREATER RIGHT SAPHENOUS VEIN HARVEST;  Surgeon: Kerin PernaPeter Van Trigt, MD; LIMA-LAD, SVG-D, SVG-OM, SVG-RCA-PDA    . HERNIA REPAIR    . KNEE SURGERY    . TEE WITHOUT CARDIOVERSION N/A 01/02/2016   Procedure: TRANSESOPHAGEAL ECHOCARDIOGRAM (TEE);  Surgeon: Kerin PernaPeter Van Trigt, MD;  [facility];  Service: Open Heart Surgery;  Laterality: N/A;  . TONSILLECTOMY    . US ECHOCARDIOGRAPHY  12/24/2015   EF 55-60%, grade 1 dd    Family History  Problem Relation  Age of Onset  . Heart disease Mother   . Cancer Father        stomach  . Allergic rhinitis Sister   . Asthma Neg Hx   . Eczema Neg Hx     Social History   Socioeconomic History  . Marital status: Married    Spouse name: Not on file  . Number of children: Not on file  . Years of education: Not on file  . Highest education level: Not on file  Occupational History  . Not on file  Tobacco Use  . Smoking status: Former Smoker    Quit date: 06/06/2012    Years since quitting: 8.4  . Smokeless tobacco: Never Used  Vaping Use  . Vaping Use: Never used  Substance and Sexual Activity  . Alcohol use: No  . Drug use: No  . Sexual activity: Not on file  Other Topics Concern  . Not on file  Social History Narrative   . Not on file   Social Determinants of Health   Financial Resource Strain: Not on file  Food Insecurity: Not on file  Transportation Needs: Not on file  Physical Activity: Not on file  Stress: Not on file  Social Connections: Not on file  Intimate Partner Violence: Not on file    Outpatient Medications Prior to Visit  Medication Sig Dispense Refill  . aspirin EC 81 MG EC tablet Take 1 tablet (81 mg total) by mouth daily.    . carvedilol (COREG) 6.25 MG tablet TAKE 1 TABLET BY MOUTH TWICE DAILY WITH MEALS 60 tablet 10  . lisinopril (ZESTRIL) 20 MG tablet Take 1 tablet (20 mg total) by mouth daily. 90 tablet 3  . rosuvastatin (CRESTOR) 20 MG tablet Take 1 tablet by mouth once daily 90 tablet 0  . sildenafil (REVATIO) 20 MG tablet Take 2-5 tabs PRN prior to sexual activity 50 tablet 3   No facility-administered medications prior to visit.    No Known Allergies  ROS Review of Systems Negative unless specially indicated above in HPI.    Objective:    Physical Exam Vitals and nursing note reviewed.  Constitutional:      General: He is not in acute distress.    Appearance: Normal appearance. He is not ill-appearing, toxic-appearing or diaphoretic.  HENT:     Head: Normocephalic and atraumatic.  Eyes:     Extraocular Movements: Extraocular movements intact.     Conjunctiva/sclera: Conjunctivae normal.     Pupils: Pupils are equal, round, and reactive to light.  Neck:     Vascular: No carotid bruit.  Cardiovascular:     Rate and Rhythm: Normal rate and regular rhythm.     Heart sounds: Normal heart sounds. No murmur heard.   Pulmonary:     Effort: Pulmonary effort is normal. No respiratory distress.     Breath sounds: Normal breath sounds.  Abdominal:     General: There is no distension.     Palpations: Abdomen is soft.     Tenderness: There is no abdominal tenderness.  Musculoskeletal:        General: Normal range of motion.     Cervical back: Neck supple. No  tenderness.     Lumbar back: No edema or bony tenderness. Negative left straight leg raise test.     Left hip: Tenderness present. No bony tenderness. Normal range of motion. Normal strength.     Right lower leg: No edema.     Left lower leg: No edema.  Legs:  Skin:    General: Skin is warm and dry.  Neurological:     General: No focal deficit present.     Mental Status: He is alert and oriented to person, place, and time.  Psychiatric:        Mood and Affect: Mood normal.        Behavior: Behavior normal.        Thought Content: Thought content normal.        Judgment: Judgment normal.     BP (!) 153/82   Pulse 85   Temp 98.5 F (36.9 C) (Temporal)   Ht 5\' 7"  (1.702 m)   Wt 208 lb 8 oz (94.6 kg)   BMI 32.66 kg/m  Wt Readings from Last 3 Encounters:  11/06/20 208 lb 8 oz (94.6 kg)  08/30/20 211 lb (95.7 kg)  08/09/19 216 lb (98 kg)     Health Maintenance Due  Topic Date Due  . COVID-19 Vaccine (1) Never done  . COLONOSCOPY (Pts 45-42yrs Insurance coverage will need to be confirmed)  Never done  . TETANUS/TDAP  11/02/2018  . PNA vac Low Risk Adult (1 of 2 - PCV13) Never done    There are no preventive care reminders to display for this patient.  Lab Results  Component Value Date   TSH 1.538 12/24/2015   Lab Results  Component Value Date   WBC 9.7 08/09/2019   HGB 14.3 08/09/2019   HCT 43.7 08/09/2019   MCV 92 08/09/2019   PLT 211 08/09/2019   Lab Results  Component Value Date   NA 138 08/09/2019   K 4.5 08/09/2019   CO2 24 08/09/2019   GLUCOSE 120 (H) 08/09/2019   BUN 10 08/09/2019   CREATININE 1.16 08/09/2019   BILITOT 0.5 08/09/2019   ALKPHOS 77 08/09/2019   AST 19 08/09/2019   ALT 13 11/27/2019   PROT 6.8 08/09/2019   ALBUMIN 4.2 08/09/2019   CALCIUM 9.2 08/09/2019   ANIONGAP 11 01/07/2016   Lab Results  Component Value Date   CHOL 95 (L) 11/27/2019   Lab Results  Component Value Date   HDL 39 (L) 11/27/2019   Lab Results   Component Value Date   LDLCALC 43 11/27/2019   Lab Results  Component Value Date   TRIG 55 11/27/2019   Lab Results  Component Value Date   CHOLHDL 2.4 11/27/2019   Lab Results  Component Value Date   HGBA1C 6.7 (H) 12/24/2015      Assessment & Plan:   Samuell was seen today for hip pain.  Diagnoses and all orders for this visit:  BMI 32/Metabolic syndrome Will have lab work completed at cardiology visit in 2 weeks. Diet and exercise.   Primary hypertension Elevated today in office, but controlled at home. Heart healthy diet handout given.   Congestive heart failure, unspecified HF chronicity, unspecified heart failure type (HCC)/Coronary artery disease due to lipid rich plaque Managed by cardiology.   Erectile dysfunction, unspecified erectile dysfunction type Well controlled on current regimen.  -     sildenafil (REVATIO) 20 MG tablet; Take 2-5 tabs PRN prior to sexual activity  Left hip pain Discussed ice, heat, tylenol. Steroid injection today if office. Steroid PO burst provided. Discussed PT, however patient declined today. Return to office for new or worsening symptoms, or if symptoms persist.  -     methylPREDNISolone acetate (DEPO-MEDROL) injection 80 mg -     predniSONE (DELTASONE) 20 MG tablet; 2 po at sametime  daily for 5 days- start tomorrow  Healthcare maintenance Declined colon cancer screening today. Will address at next visit. Will scheduled appointment for PNA vaccine in 2 weeks.   No orders of the defined types were placed in this encounter.   Follow-up: 6 months for chronic follow up, sooner if needed.   The patient indicates understanding of these issues and agrees with the plan.   Gabriel Earing, FNP

## 2020-11-07 ENCOUNTER — Telehealth: Payer: Self-pay

## 2020-11-07 NOTE — Telephone Encounter (Signed)
[name] Key: Jaquelyn Bitter - [place] Case ID: [id number] - Rx #: [id number] Need help? Call us at [phone]  Status Sent to [employer]  Drug Sildenafil Citrate 20MG  tablets  Form [place] Form [phone] NCPDP) Original Claim Info [phone]

## 2020-11-09 NOTE — Telephone Encounter (Signed)
[place] denied insurance does not cover sildenafil for ED.

## 2020-11-21 ENCOUNTER — Telehealth (INDEPENDENT_AMBULATORY_CARE_PROVIDER_SITE_OTHER): Payer: BC Managed Care – PPO | Admitting: Cardiology

## 2020-11-21 ENCOUNTER — Telehealth: Payer: Self-pay | Admitting: *Deleted

## 2020-11-21 ENCOUNTER — Other Ambulatory Visit: Payer: Self-pay

## 2020-11-21 ENCOUNTER — Encounter: Payer: Self-pay | Admitting: Cardiology

## 2020-11-21 VITALS — BP 138/77 | HR 71 | Ht 67.0 in | Wt 207.0 lb

## 2020-11-21 DIAGNOSIS — E785 Hyperlipidemia, unspecified: Secondary | ICD-10-CM

## 2020-11-21 DIAGNOSIS — Z951 Presence of aortocoronary bypass graft: Secondary | ICD-10-CM

## 2020-11-21 DIAGNOSIS — I2583 Coronary atherosclerosis due to lipid rich plaque: Secondary | ICD-10-CM

## 2020-11-21 DIAGNOSIS — I2510 Atherosclerotic heart disease of native coronary artery without angina pectoris: Secondary | ICD-10-CM

## 2020-11-21 NOTE — Telephone Encounter (Signed)
  Patient Consent for Virtual Visit         Thomas Thomas has provided verbal consent on 11/21/2020 for a virtual visit (video or telephone).   CONSENT FOR VIRTUAL VISIT FOR:  Thomas Thomas  By participating in this virtual visit I agree to the following:  I hereby voluntarily request, consent and authorize [facility] and its employed or contracted physicians, physician assistants, nurse practitioners or other licensed health care professionals (the Practitioner), to provide me with telemedicine health care services (the "Services") as deemed necessary by the treating Practitioner. I acknowledge and consent to receive the Services by the Practitioner via telemedicine. I understand that the telemedicine visit will involve communicating with the Practitioner through live audiovisual communication technology and the disclosure of certain medical information by electronic transmission. I acknowledge that I have been given the opportunity to request an in-person assessment or other available alternative prior to the telemedicine visit and am voluntarily participating in the telemedicine visit.  I understand that I have the right to withhold or withdraw my consent to the use of telemedicine in the course of my care at any time, without affecting my right to future care or treatment, and that the Practitioner or I may terminate the telemedicine visit at any time. I understand that I have the right to inspect all information obtained and/or recorded in the course of the telemedicine visit and may receive copies of available information for a reasonable fee.  I understand that some of the potential risks of receiving the Services via telemedicine include:  Marland Kitchen Delay or interruption in medical evaluation due to technological equipment failure or disruption; . Information transmitted may not be sufficient (e.g. poor resolution of images) to allow for appropriate medical decision making by the Practitioner;  and/or  . In rare instances, security protocols could fail, causing a breach of personal health information.  Furthermore, I acknowledge that it is my responsibility to provide information about my medical history, conditions and care that is complete and accurate to the best of my ability. I acknowledge that Practitioner's advice, recommendations, and/or decision may be based on factors not within their control, such as incomplete or inaccurate data provided by me or distortions of diagnostic images or specimens that may result from electronic transmissions. I understand that the practice of medicine is not an exact science and that Practitioner makes no warranties or guarantees regarding treatment outcomes. I acknowledge that a copy of this consent can be made available to me via my patient portal [facility] MyChart), or I can request a printed copy by calling the office of [facility].    I understand that my insurance will be billed for this visit.   I have read or had this consent read to me. . I understand the contents of this consent, which adequately explains the benefits and risks of the Services being provided via telemedicine.  . I have been provided ample opportunity to ask questions regarding this consent and the Services and have had my questions answered to my satisfaction. . I give my informed consent for the services to be provided through the use of telemedicine in my medical care

## 2020-11-21 NOTE — Patient Instructions (Signed)

## 2020-11-21 NOTE — Progress Notes (Signed)
Virtual Visit via Telephone Note   This visit type was conducted due to national recommendations for restrictions regarding the COVID-19 Pandemic (e.g. social distancing) in an effort to limit this patient's exposure and mitigate transmission in our community.  Due to his co-morbid illnesses, this patient is at least at moderate risk for complications without adequate follow up.  This format is felt to be most appropriate for this patient at this time.  The patient did not have access to video technology/had technical difficulties with video requiring transitioning to audio format only (telephone).  All issues noted in this document were discussed and addressed.  No physical exam could be performed with this format.  Please refer to the patient's chart for his  consent to telehealth for [facility].    Date:  11/21/2020   ID:  Thomas Thomas, DOB June 19, 1956, MRN [id number] The patient was identified using 2 identifiers.  Patient Location: Home Provider Location: Home Office  PCP:  Gabriel Earing, FNP  Cardiologist:  Donato Schultz, MD  Electrophysiologist:  None   Evaluation Performed:  Follow-Up Visit    History of Present Illness:    Thomas Thomas is a 65 y.o. male here for follow-up of coronary artery disease prior CABG 2017.  Has had a little bit of windedness with activity and sharp atypical pain.  Works at [employer].  In charge of keeping the plant sanitized.  He also states that he has had some subtle left lower rib cage discomfort.  He has been under increased stress, one of his friends who is 54 years old she recently died of a heart attack at [facility].  He states that he had encouraged her to go and get checked out earlier.  At last visit was also struggling a little bit with blood pressures.  These have improved after increasing the lisinopril to 20 mg.  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  He is considering retiring or working part-time  soon.  The patient does not have symptoms concerning for COVID-19 infection (fever, chills, cough, or new shortness of breath).    Past Medical History:  Diagnosis Date  . Anxiety   . Diabetes mellitus type II, non insulin dependent (HCC) 11/2015  . Hypertension   . NSTEMI (non-ST elevated myocardial infarction) (HCC) 12/23/2015   Past Surgical History:  Procedure Laterality Date  . CARDIAC CATHETERIZATION N/A 12/25/2015   Procedure: Left Heart Cath and Coronary Angiography;  Surgeon: Peter M Swaziland, MD;  Location: [facility] INVASIVE CV LAB;  Service: Cardiovascular;  Laterality: N/A;  . CORONARY ARTERY BYPASS GRAFT N/A 01/02/2016   Procedure: CORONARY ARTERY BYPASS GRAFTING (CABG)X 4 LIMA-LAD; SVG-RCA(ENDARDERECTOMY); SVG-DIAG; SVG-OM ENDOSCOPIC GREATER RIGHT SAPHENOUS VEIN HARVEST;  Surgeon: Kerin Perna, MD; LIMA-LAD, SVG-D, SVG-OM, SVG-RCA-PDA    . HERNIA REPAIR    . KNEE SURGERY    . TEE WITHOUT CARDIOVERSION N/A 01/02/2016   Procedure: TRANSESOPHAGEAL ECHOCARDIOGRAM (TEE);  Surgeon: Kerin Perna, MD;  Location: [facility];  Service: Open Heart Surgery;  Laterality: N/A;  . TONSILLECTOMY    . US ECHOCARDIOGRAPHY  12/24/2015   EF 55-60%, grade 1 dd     Current Meds  Medication Sig  . aspirin EC 81 MG EC tablet Take 1 tablet (81 mg total) by mouth daily.  . carvedilol (COREG) 6.25 MG tablet TAKE 1 TABLET BY MOUTH TWICE DAILY WITH MEALS  . lisinopril (ZESTRIL) 20 MG tablet Take 1 tablet (20 mg total) by mouth daily.  Marland Kitchen  rosuvastatin (CRESTOR) 20 MG tablet Take 1 tablet by mouth once daily  . sildenafil (REVATIO) 20 MG tablet Take 2-5 tabs PRN prior to sexual activity     Allergies:   Patient has no known allergies.   Social History   Tobacco Use  . Smoking status: Former Smoker    Quit date: 06/06/2012    Years since quitting: 8.4  . Smokeless tobacco: Never Used  Vaping Use  . Vaping Use: Never used  Substance Use Topics  . Alcohol use: No  . Drug use: No     Family Hx: The  patient's family history includes Allergic rhinitis in his sister; Cancer in his father; Heart disease in his mother. There is no history of Asthma or Eczema.  ROS:   Please see the history of present illness.     All other systems reviewed and are negative.    Recent Labs: 11/27/2019: ALT 13   Recent Lipid Panel Lab Results  Component Value Date/Time   CHOL 95 (L) 11/27/2019 07:38 AM   TRIG 55 11/27/2019 07:38 AM   HDL 39 (L) 11/27/2019 07:38 AM   CHOLHDL 2.4 11/27/2019 07:38 AM   CHOLHDL 3.7 12/24/2015 12:20 AM   LDLCALC 43 11/27/2019 07:38 AM    Wt Readings from Last 3 Encounters:  11/21/20 207 lb (93.9 kg)  11/06/20 208 lb 8 oz (94.6 kg)  08/30/20 211 lb (95.7 kg)     Risk Assessment/Calculations:      Objective:    Vital Signs:  BP 138/77   Pulse 71   Ht 5\' 7"  (1.702 m)   Wt 207 lb (93.9 kg)   BMI 32.42 kg/m    VITAL SIGNS:  reviewed  ASSESSMENT & PLAN:    Coronary artery disease -Post CABG 2017. -Doing well without any anginal symptoms.  On aspirin lisinopril carvedilol Crestor high intensity dose.  No myalgias.  Atypical chest pain -Having some left-sided below left rib cage fleeting discomfort.  He will keep an eye on this.  Low threshold for further testing if this intensifies.  Hyperlipidemia -On Crestor 20.  LDL is less than 70.  Previously checked was 43.  Doing well.  ALT 13.  No myalgias.  Social hypertension -Stress has brought about some increase in blood pressures.  Even at home sometimes though he is in the 140s 150s.  At last visit we increased his lisinopril from 2.5 up to 20.  Now 134/70.  Excellent job.  He will keep an eye on this.  Feels well.    COVID-19 Education: The signs and symptoms of COVID-19 were discussed with the patient and how to seek care for testing (follow up with PCP or arrange E-visit).  The importance of social distancing was discussed today.  Time:   Today, I have spent 21 minutes with the patient with  telehealth technology discussing the above problems.     Medication Adjustments/Labs and Tests Ordered: Current medicines are reviewed at length with the patient today.  Concerns regarding medicines are outlined above.   Tests Ordered: No orders of the defined types were placed in this encounter.   Medication Changes: No orders of the defined types were placed in this encounter.   Follow Up:  In Person in 6 month(s)  Signed, 2018, MD  11/21/2020 4:51 PM    [facility]

## 2020-12-04 ENCOUNTER — Other Ambulatory Visit: Payer: Self-pay | Admitting: Cardiology

## 2021-06-26 ENCOUNTER — Other Ambulatory Visit: Payer: Self-pay | Admitting: *Deleted

## 2021-06-26 MED ORDER — CARVEDILOL 6.25 MG PO TABS: 6.2500 mg | ORAL_TABLET | Freq: Two times a day (BID) | ORAL | 3 refills | Status: DC

## 2021-08-08 ENCOUNTER — Telehealth: Payer: Self-pay | Admitting: Cardiology

## 2021-08-08 ENCOUNTER — Other Ambulatory Visit: Payer: Self-pay

## 2021-08-08 ENCOUNTER — Encounter: Payer: Self-pay | Admitting: Cardiology

## 2021-08-08 ENCOUNTER — Telehealth (INDEPENDENT_AMBULATORY_CARE_PROVIDER_SITE_OTHER): Payer: BC Managed Care – PPO | Admitting: Cardiology

## 2021-08-08 DIAGNOSIS — I2510 Atherosclerotic heart disease of native coronary artery without angina pectoris: Secondary | ICD-10-CM

## 2021-08-08 DIAGNOSIS — R030 Elevated blood-pressure reading, without diagnosis of hypertension: Secondary | ICD-10-CM | POA: Diagnosis not present

## 2021-08-08 DIAGNOSIS — R779 Abnormality of plasma protein, unspecified: Secondary | ICD-10-CM | POA: Diagnosis not present

## 2021-08-08 DIAGNOSIS — E7800 Pure hypercholesterolemia, unspecified: Secondary | ICD-10-CM | POA: Diagnosis not present

## 2021-08-08 NOTE — Telephone Encounter (Signed)
Pt would like to know if he can keep his appt for today but do the appt virtually due to several employees being out with Covid. Pt does not have any symptoms.  Pt's appt is today at 3:40pm

## 2021-08-08 NOTE — Assessment & Plan Note (Signed)
-  Post CABG 2017. -Doing well without any anginal symptoms. On aspirin lisinopril carvedilol Crestor high intensity dose. No myalgias.

## 2021-08-08 NOTE — Patient Instructions (Signed)

## 2021-08-08 NOTE — Progress Notes (Signed)
Virtual Visit via Video Note   This visit type was conducted due to national recommendations for restrictions regarding the COVID-19 Pandemic (e.g. social distancing) in an effort to limit this patient's exposure and mitigate transmission in our community.  Due to his co-morbid illnesses, this patient is at least at moderate risk for complications without adequate follow up.  This format is felt to be most appropriate for this patient at this time.  All issues noted in this document were discussed and addressed.  A limited physical exam was performed with this format.  Please refer to the patient's chart for his consent to telehealth for [facility].       Date:  08/08/2021   ID:  Thomas Thomas, DOB [birth year], MRN [id number] The patient was identified using 2 identifiers.  Patient Location: Home Provider Location: Office/Clinic   PCP:  Gabriel Earing, FNP   [facility] Providers Cardiologist:  Donato Schultz, MD     Evaluation Performed:  Follow-Up Visit  Chief Complaint:  CAD  History of Present Illness:    Thomas Thomas is a 65 y.o. male follow-up of CAD post CABG 2017. Works at [employer].  In charge of keeping the plant sanitized.  He also states previously that he has had some subtle left lower rib cage discomfort.  He has been under increased stress, one of his friends who is 79 years old she recently died of a heart attack at [facility].  He states that he had encouraged her to go and get checked out earlier.   At last visit was also struggling a little bit with blood pressures.  These have improved after increasing the lisinopril to 20 mg.  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   He is considering retiring or working part-time soon.  He still wants to stay active.  The patient does not have symptoms concerning for COVID-19 infection (fever, chills, cough, or new shortness of breath).    Past Medical History:  Diagnosis Date   Anxiety     Diabetes mellitus type II, non insulin dependent (HCC) 11/2015   Hypertension    NSTEMI (non-ST elevated myocardial infarction) (HCC) 12/23/2015   Past Surgical History:  Procedure Laterality Date   CARDIAC CATHETERIZATION N/A 12/25/2015   Procedure: Left Heart Cath and Coronary Angiography;  Surgeon: Peter M Swaziland, MD;  Location: [facility] INVASIVE CV LAB;  Service: Cardiovascular;  Laterality: N/A;   CORONARY ARTERY BYPASS GRAFT N/A 01/02/2016   Procedure: CORONARY ARTERY BYPASS GRAFTING (CABG)X 4 LIMA-LAD; SVG-RCA(ENDARDERECTOMY); SVG-DIAG; SVG-OM ENDOSCOPIC GREATER RIGHT SAPHENOUS VEIN HARVEST;  Surgeon: Kerin Perna, MD; LIMA-LAD, SVG-D, SVG-OM, SVG-RCA-PDA     HERNIA REPAIR     KNEE SURGERY     TEE WITHOUT CARDIOVERSION N/A 01/02/2016   Procedure: TRANSESOPHAGEAL ECHOCARDIOGRAM (TEE);  Surgeon: Kerin Perna, MD;  Location: [facility] OR;  Service: Open Heart Surgery;  Laterality: N/A;   TONSILLECTOMY     US ECHOCARDIOGRAPHY  12/24/2015   EF 55-60%, grade 1 dd     Current Meds  Medication Sig   aspirin EC 81 MG EC tablet Take 1 tablet (81 mg total) by mouth daily.   carvedilol (COREG) 6.25 MG tablet Take 1 tablet (6.25 mg total) by mouth 2 (two) times daily with a meal.   lisinopril (ZESTRIL) 20 MG tablet Take 1 tablet (20 mg total) by mouth daily.   rosuvastatin (CRESTOR) 20 MG tablet Take 1 tablet by mouth once daily   sildenafil (REVATIO)  20 MG tablet Take 2-5 tabs PRN prior to sexual activity     Allergies:   Patient has no known allergies.   Social History   Tobacco Use   Smoking status: Former    Types: Cigarettes    Quit date: 06/06/2012    Years since quitting: 9.1   Smokeless tobacco: Never  Vaping Use   Vaping Use: Never used  Substance Use Topics   Alcohol use: No   Drug use: No     Family Hx: The patient's family history includes Allergic rhinitis in his sister; Cancer in his father; Heart disease in his mother. There is no history of Asthma or Eczema.  ROS:    Please see the history of present illness.    Denies any fevers chills nausea vomiting syncope bleeding All other systems reviewed and are negative.  Recent Lipid Panel Lab Results  Component Value Date/Time   CHOL 95 (L) 11/27/2019 07:38 AM   TRIG 55 11/27/2019 07:38 AM   HDL 39 (L) 11/27/2019 07:38 AM   CHOLHDL 2.4 11/27/2019 07:38 AM   CHOLHDL 3.7 12/24/2015 12:20 AM   LDLCALC 43 11/27/2019 07:38 AM    Wt Readings from Last 3 Encounters:  08/08/21 210 lb (95.3 kg)  11/21/20 207 lb (93.9 kg)  11/06/20 208 lb 8 oz (94.6 kg)       Objective:    Vital Signs:  BP 138/78   Pulse 79   Ht 5\' 7"  (1.702 m)   Wt 210 lb (95.3 kg)   BMI 32.89 kg/m    He is alert and oriented x3, normal respiratory effort, pleasant, moves all extremities.  ASSESSMENT & PLAN:    Coronary artery disease -Post CABG 2017. -Doing well without any anginal symptoms.  On aspirin lisinopril carvedilol Crestor high intensity dose.  No myalgias.   Atypical chest pain -Having some left-sided below left rib cage fleeting discomfort.  He will keep an eye on this.  Low threshold for further testing if this intensifies.   Hyperlipidemia -On Crestor 20.  LDL is less than 70.  Previously checked was 43.  Doing well.  ALT 13.  No myalgias.   Social hypertension -Stress has brought about some increase in blood pressures.  Even at home sometimes though he is in the 140s 150s.  At last visit we increased his lisinopril from 2.5 up to 20.  Now 134/70.  Excellent job.  He will keep an eye on this.  Feels well.   59-month follow-up   COVID-19 Education: The signs and symptoms of COVID-19 were discussed with the patient and how to seek care for testing (follow up with PCP or arrange E-visit).  The importance of social distancing was discussed today.  Time:   Today, I have spent 21 minutes with the patient with telehealth technology discussing the above problems.     Medication Adjustments/Labs and Tests  Ordered: Current medicines are reviewed at length with the patient today.  Concerns regarding medicines are outlined above.   Tests Ordered: No orders of the defined types were placed in this encounter.   Medication Changes: No orders of the defined types were placed in this encounter.   Follow Up:  In Person in 6 month(s)  Signed, 8-month, MD  08/08/2021 4:11 PM    [facility]

## 2021-08-08 NOTE — Telephone Encounter (Signed)
OK to change to VV per Dr Anne Fu. Pt aware and agreeable to MyChart visit.     Patient Consent for Virtual Visit        Thomas Thomas has provided verbal consent on 08/08/2021 for a virtual visit (video or telephone).   CONSENT FOR VIRTUAL VISIT FOR:  Thomas Thomas  By participating in this virtual visit I agree to the following:  I hereby voluntarily request, consent and authorize [facility] and its employed or contracted physicians, physician assistants, nurse practitioners or other licensed health care professionals (the Practitioner), to provide me with telemedicine health care services (the "Services") as deemed necessary by the treating Practitioner. I acknowledge and consent to receive the Services by the Practitioner via telemedicine. I understand that the telemedicine visit will involve communicating with the Practitioner through live audiovisual communication technology and the disclosure of certain medical information by electronic transmission. I acknowledge that I have been given the opportunity to request an in-person assessment or other available alternative prior to the telemedicine visit and am voluntarily participating in the telemedicine visit.  I understand that I have the right to withhold or withdraw my consent to the use of telemedicine in the course of my care at any time, without affecting my right to future care or treatment, and that the Practitioner or I may terminate the telemedicine visit at any time. I understand that I have the right to inspect all information obtained and/or recorded in the course of the telemedicine visit and may receive copies of available information for a reasonable fee.  I understand that some of the potential risks of receiving the Services via telemedicine include:  Delay or interruption in medical evaluation due to technological equipment failure or disruption; Information transmitted may not be sufficient (e.g. poor resolution of  images) to allow for appropriate medical decision making by the Practitioner; and/or  In rare instances, security protocols could fail, causing a breach of personal health information.  Furthermore, I acknowledge that it is my responsibility to provide information about my medical history, conditions and care that is complete and accurate to the best of my ability. I acknowledge that Practitioner's advice, recommendations, and/or decision may be based on factors not within their control, such as incomplete or inaccurate data provided by me or distortions of diagnostic images or specimens that may result from electronic transmissions. I understand that the practice of medicine is not an exact science and that Practitioner makes no warranties or guarantees regarding treatment outcomes. I acknowledge that a copy of this consent can be made available to me via my patient portal [facility] MyChart), or I can request a printed copy by calling the office of [facility].    I understand that my insurance will be billed for this visit.   I have read or had this consent read to me. I understand the contents of this consent, which adequately explains the benefits and risks of the Services being provided via telemedicine.  I have been provided ample opportunity to ask questions regarding this consent and the Services and have had my questions answered to my satisfaction. I give my informed consent for the services to be provided through the use of telemedicine in my medical care    .

## 2021-08-08 NOTE — Assessment & Plan Note (Signed)
Stress has brought about some increase in blood pressures. Even at home sometimes though he is in the 140s 150s. At prior visit we increased his lisinopril from 2.5 up to 20. Now 134/70.Excellent job. He will keep an eye on this. Feels well.

## 2021-08-08 NOTE — Assessment & Plan Note (Signed)
-  On Crestor 20. LDL is less than 70. Previously checked was 43. Doing well. ALT 13. No myalgias.

## 2021-08-30 ENCOUNTER — Other Ambulatory Visit: Payer: Self-pay | Admitting: Cardiology

## 2021-09-01 ENCOUNTER — Encounter: Payer: Self-pay | Admitting: Nurse Practitioner

## 2021-09-01 ENCOUNTER — Other Ambulatory Visit: Payer: Self-pay

## 2021-09-01 ENCOUNTER — Ambulatory Visit: Payer: BC Managed Care – PPO | Admitting: Nurse Practitioner

## 2021-09-01 VITALS — BP 157/74 | HR 67 | Temp 98.5°F | Ht 67.0 in | Wt 215.5 lb

## 2021-09-01 DIAGNOSIS — H9201 Otalgia, right ear: Secondary | ICD-10-CM | POA: Diagnosis not present

## 2021-09-01 MED ORDER — OFLOXACIN 0.3 % OT SOLN: 10.0000 [drp] | Freq: Every day | OTIC | 0 refills | Status: DC

## 2021-09-01 MED ORDER — IBUPROFEN 600 MG PO TABS: 600.0000 mg | ORAL_TABLET | Freq: Three times a day (TID) | ORAL | 0 refills | Status: DC | PRN

## 2021-09-01 NOTE — Progress Notes (Signed)
Acute Office Visit  Subjective:    Patient ID: Thomas Thomas, male    DOB: 1956-05-31, 65 y.o.   MRN: [id number]  Chief Complaint  Patient presents with   Ear Pain    Otalgia  There is pain in the right ear. This is a new problem. The current episode started in the past 7 days. The problem occurs constantly. The problem has been unchanged. There has been no fever. The pain is moderate. Pertinent negatives include no headaches, hearing loss, neck pain, rash or sore throat. Treatments tried: Flushing with sweet oil. The treatment provided mild relief.    Past Medical History:  Diagnosis Date   Anxiety    Diabetes mellitus type II, non insulin dependent (HCC) 11/2015   Hypertension    NSTEMI (non-ST elevated myocardial infarction) (HCC) 12/23/2015    Past Surgical History:  Procedure Laterality Date   CARDIAC CATHETERIZATION N/A 12/25/2015   Procedure: Left Heart Cath and Coronary Angiography;  Surgeon: Peter M Swaziland, MD;  Location: [facility] INVASIVE CV LAB;  Service: Cardiovascular;  Laterality: N/A;   CORONARY ARTERY BYPASS GRAFT N/A 01/02/2016   Procedure: CORONARY ARTERY BYPASS GRAFTING (CABG)X 4 LIMA-LAD; SVG-RCA(ENDARDERECTOMY); SVG-DIAG; SVG-OM ENDOSCOPIC GREATER RIGHT SAPHENOUS VEIN HARVEST;  Surgeon: Kerin Perna, MD; LIMA-LAD, SVG-D, SVG-OM, SVG-RCA-PDA     HERNIA REPAIR     KNEE SURGERY     TEE WITHOUT CARDIOVERSION N/A 01/02/2016   Procedure: TRANSESOPHAGEAL ECHOCARDIOGRAM (TEE);  Surgeon: Kerin Perna, MD;  Location: [facility] OR;  Service: Open Heart Surgery;  Laterality: N/A;   TONSILLECTOMY     US ECHOCARDIOGRAPHY  12/24/2015   EF 55-60%, grade 1 dd    Family History  Problem Relation Age of Onset   Heart disease Mother    Cancer Father        stomach   Allergic rhinitis Sister    Asthma Neg Hx    Eczema Neg Hx     Social History   Socioeconomic History   Marital status: Married    Spouse name: Not on file   Number of children: Not on file   Years of education:  Not on file   Highest education level: Not on file  Occupational History   Not on file  Tobacco Use   Smoking status: Former    Types: Cigarettes    Quit date: 06/06/2012    Years since quitting: 9.2   Smokeless tobacco: Never  Vaping Use   Vaping Use: Never used  Substance and Sexual Activity   Alcohol use: No   Drug use: No   Sexual activity: Not on file  Other Topics Concern   Not on file  Social History Narrative   Not on file   Social Determinants of Health   Financial Resource Strain: Not on file  Food Insecurity: Not on file  Transportation Needs: Not on file  Physical Activity: Not on file  Stress: Not on file  Social Connections: Not on file  Intimate Partner Violence: Not on file    Outpatient Medications Prior to Visit  Medication Sig Dispense Refill   aspirin EC 81 MG EC tablet Take 1 tablet (81 mg total) by mouth daily.     carvedilol (COREG) 6.25 MG tablet Take 1 tablet (6.25 mg total) by mouth 2 (two) times daily with a meal. 60 tablet 3   lisinopril (ZESTRIL) 20 MG tablet Take 1 tablet by mouth once daily 90 tablet 0   rosuvastatin (CRESTOR) 20 MG tablet Take 1 tablet  by mouth once daily 90 tablet 3   sildenafil (REVATIO) 20 MG tablet Take 2-5 tabs PRN prior to sexual activity 50 tablet 3   No facility-administered medications prior to visit.    No Known Allergies  Review of Systems  HENT:  Positive for ear pain. Negative for hearing loss and sore throat.   Cardiovascular: Negative.   Gastrointestinal: Negative.   Musculoskeletal:  Negative for neck pain.  Skin:  Negative for rash.  Neurological:  Negative for headaches.  All other systems reviewed and are negative.     Objective:    Physical Exam Vitals and nursing note reviewed.  Constitutional:      Appearance: Normal appearance.  HENT:     Head: Normocephalic.     Right Ear: Hearing and external ear normal. Swelling and tenderness present. There is no impacted cerumen. No foreign body.      Left Ear: Hearing and external ear normal. There is impacted cerumen. No foreign body.     Nose: Nose normal. No congestion.  Eyes:     Conjunctiva/sclera: Conjunctivae normal.  Cardiovascular:     Rate and Rhythm: Normal rate and regular rhythm.     Pulses: Normal pulses.     Heart sounds: Normal heart sounds.  Pulmonary:     Effort: Pulmonary effort is normal.     Breath sounds: Normal breath sounds.  Abdominal:     General: Bowel sounds are normal.  Skin:    General: Skin is warm.     Findings: No rash.  Neurological:     Mental Status: He is alert.  Psychiatric:        Mood and Affect: Mood normal.        Behavior: Behavior normal.    BP (!) 157/74   Pulse 67   Temp 98.5 F (36.9 C) (Temporal)   Ht 5\' 7"  (1.702 m)   Wt 215 lb 8 oz (97.8 kg)   BMI 33.75 kg/m  Wt Readings from Last 3 Encounters:  09/01/21 215 lb 8 oz (97.8 kg)  08/08/21 210 lb (95.3 kg)  11/21/20 207 lb (93.9 kg)    Health Maintenance Due  Topic Date Due   COVID-19 Vaccine (1) Never done   Pneumonia Vaccine 41+ Years old (1 - PCV) Never done   Zoster Vaccines- Shingrix (1 of 2) Never done   COLONOSCOPY (Pts 45-65yrs Insurance coverage will need to be confirmed)  Never done   TETANUS/TDAP  11/02/2018    There are no preventive care reminders to display for this patient.   Lab Results  Component Value Date   TSH 1.538 12/24/2015   Lab Results  Component Value Date   WBC 9.7 08/09/2019   HGB 14.3 08/09/2019   HCT 43.7 08/09/2019   MCV 92 08/09/2019   PLT 211 08/09/2019   Lab Results  Component Value Date   NA 138 08/09/2019   K 4.5 08/09/2019   CO2 24 08/09/2019   GLUCOSE 120 (H) 08/09/2019   BUN 10 08/09/2019   CREATININE 1.16 08/09/2019   BILITOT 0.5 08/09/2019   ALKPHOS 77 08/09/2019   AST 19 08/09/2019   ALT 13 11/27/2019   PROT 6.8 08/09/2019   ALBUMIN 4.2 08/09/2019   CALCIUM 9.2 08/09/2019   ANIONGAP 11 01/07/2016   Lab Results  Component Value Date   CHOL 95  (L) 11/27/2019   Lab Results  Component Value Date   HDL 39 (L) 11/27/2019   Lab Results  Component Value Date  LDLCALC 43 11/27/2019   Lab Results  Component Value Date   TRIG 55 11/27/2019   Lab Results  Component Value Date   CHOLHDL 2.4 11/27/2019   Lab Results  Component Value Date   HGBA1C 6.7 (H) 12/24/2015       Assessment & Plan:   Problem List Items Addressed This Visit       Other   Right ear pain - Primary    Unresolved right ear pain in the past 7 days.  Switched over to flush wax have helped mildly.  Advised patient to use anti-inflammatory for pain, on assessment, patient's ear is tender and swollen.  Slight ringing and whooshing sound.  Ofloxacin octic solution for 10 days.  Debrox to be wax left ear.  Rx sent to pharmacy.  Follow-up with worsening unresolved symptoms.      Relevant Medications   ofloxacin (FLOXIN OTIC) 0.3 % OTIC solution   ibuprofen (ADVIL) 600 MG tablet     Meds ordered this encounter  Medications   ofloxacin (FLOXIN OTIC) 0.3 % OTIC solution    Sig: Place 10 drops into the right ear daily.    Dispense:  5 mL    Refill:  0    Order Specific Question:   Supervising Provider    Answer:   Standley Brooking   ibuprofen (ADVIL) 600 MG tablet    Sig: Take 1 tablet (600 mg total) by mouth every 8 (eight) hours as needed.    Dispense:  30 tablet    Refill:  0    Order Specific Question:   Supervising Provider    Answer:   Mechele Claude [[id number]]     Daryll Drown, NP

## 2021-09-01 NOTE — Patient Instructions (Signed)
Earache, Adult An earache, or ear pain, can be caused by many things, including: An infection. Ear wax buildup. Ear pressure. Something in the ear that should not be there (foreign body). A sore throat. Tooth problems. Jaw problems. Treatment of the earache will depend on the cause. If the cause is not clear or cannot be determined, you may need to watch your symptoms until your earache goes away or until a cause is found. Follow these instructions at home: Medicines Take or apply over-the-counter and prescription medicines only as told by your health care provider. If you were prescribed an antibiotic medicine, use it as told by your health care provider. Do not stop using the antibiotic even if you start to feel better. Do not put anything in your ear other than medicine that is prescribed by your health care provider. Managing pain If directed, apply heat to the affected area as often as told by your health care provider. Use the heat source that your health care provider recommends, such as a moist heat pack or a heating pad. Place a towel between your skin and the heat source. Leave the heat on for 20-30 minutes. Remove the heat if your skin turns bright red. This is especially important if you are unable to feel pain, heat, or cold. You may have a greater risk of getting burned. If directed, put ice on the affected area as often as told by your health care provider. To do this:   Put ice in a plastic bag. Place a towel between your skin and the bag. Leave the ice on for 20 minutes, 2-3 times a day. General instructions Pay attention to any changes in your symptoms. Try resting in an upright position instead of lying down. This may help to reduce pressure in your ear and relieve pain. Chew gum if it helps to relieve your ear pain. Treat any allergies as told by your health care provider. Drink enough fluid to keep your urine pale yellow. It is up to you to get the results of any  tests that were done. Ask your health care provider, or the department that is doing the tests, when your results will be ready. Keep all follow-up visits as told by your health care provider. This is important. Contact a health care provider if: Your pain does not improve within 2 days. Your earache gets worse. You have new symptoms. You have a fever. Get help right away if you: Have a severe headache. Have a stiff neck. Have trouble swallowing. Have redness or swelling behind your ear. Have fluid or blood coming from your ear. Have hearing loss. Feel dizzy. Summary An earache, or ear pain, can be caused by many things. Treatment of the earache will depend on the cause. Follow recommendations from your health care provider to treat your ear pain. If the cause is not clear or cannot be determined, you may need to watch your symptoms until your earache goes away or until a cause is found. Keep all follow-up visits as told by your health care provider. This is important. This information is not intended to replace advice given to you by your health care provider. Make sure you discuss any questions you have with your health care provider. Document Revised: 05/20/2019 Document Reviewed: 05/20/2019 Elsevier Patient Education  2022 Elsevier Inc.  

## 2021-09-01 NOTE — Assessment & Plan Note (Signed)
Unresolved right ear pain in the past 7 days.  Switched over to flush wax have helped mildly.  Advised patient to use anti-inflammatory for pain, on assessment, patient's ear is tender and swollen.  Slight ringing and whooshing sound.  Ofloxacin octic solution for 10 days.  Debrox to be wax left ear.  Rx sent to pharmacy.  Follow-up with worsening unresolved symptoms.

## 2021-10-27 ENCOUNTER — Encounter (HOSPITAL_COMMUNITY): Payer: Self-pay | Admitting: *Deleted

## 2021-10-27 ENCOUNTER — Other Ambulatory Visit: Payer: Self-pay

## 2021-10-27 ENCOUNTER — Emergency Department (HOSPITAL_COMMUNITY)
Admission: EM | Admit: 2021-10-27 | Discharge: 2021-10-27 | Disposition: A | Discharge status: Home / Self Care | Payer: BC Managed Care – PPO | Attending: Emergency Medicine | Admitting: Emergency Medicine

## 2021-10-27 ENCOUNTER — Emergency Department (HOSPITAL_COMMUNITY): Payer: BC Managed Care – PPO

## 2021-10-27 DIAGNOSIS — I10 Essential (primary) hypertension: Secondary | ICD-10-CM | POA: Insufficient documentation

## 2021-10-27 DIAGNOSIS — Z79899 Other long term (current) drug therapy: Secondary | ICD-10-CM | POA: Insufficient documentation

## 2021-10-27 DIAGNOSIS — I2510 Atherosclerotic heart disease of native coronary artery without angina pectoris: Secondary | ICD-10-CM | POA: Diagnosis not present

## 2021-10-27 DIAGNOSIS — Z7982 Long term (current) use of aspirin: Secondary | ICD-10-CM | POA: Insufficient documentation

## 2021-10-27 DIAGNOSIS — R0789 Other chest pain: Secondary | ICD-10-CM | POA: Diagnosis not present

## 2021-10-27 DIAGNOSIS — Z951 Presence of aortocoronary bypass graft: Secondary | ICD-10-CM | POA: Diagnosis not present

## 2021-10-27 DIAGNOSIS — R079 Chest pain, unspecified: Secondary | ICD-10-CM | POA: Diagnosis not present

## 2021-10-27 LAB — BASIC METABOLIC PANEL
Anion gap: 10 (ref 5–15)
BUN: 19 mg/dL (ref 8–23)
CO2: 24 mmol/L (ref 22–32)
Calcium: 8.9 mg/dL (ref 8.9–10.3)
Chloride: 99 mmol/L (ref 98–111)
Creatinine, Ser: 1.49 mg/dL — ABNORMAL HIGH (ref 0.61–1.24)
GFR, Estimated: 51 mL/min — ABNORMAL LOW (ref >60)
Glucose, Bld: 114 mg/dL — ABNORMAL HIGH (ref 70–99)
Potassium: 3.8 mmol/L (ref 3.5–5.1)
Sodium: 133 mmol/L — ABNORMAL LOW (ref 135–145)

## 2021-10-27 LAB — TROPONIN I (HIGH SENSITIVITY)
Troponin I (High Sensitivity): 8 ng/L (ref <18)
Troponin I (High Sensitivity): 7 ng/L (ref <18)

## 2021-10-27 LAB — CBC
HCT: 43.8 % (ref 39.0–52.0)
Hemoglobin: 14 g/dL (ref 13.0–17.0)
MCH: 29.5 pg (ref 26.0–34.0)
MCHC: 32 g/dL (ref 30.0–36.0)
MCV: 92.2 fL (ref 80.0–100.0)
Platelets: 200 10*3/uL (ref 150–400)
RBC: 4.75 MIL/uL (ref 4.22–5.81)
RDW: 14.6 % (ref 11.5–15.5)
WBC: 10.8 10*3/uL — ABNORMAL HIGH (ref 4.0–10.5)
nRBC: 0 % (ref 0.0–0.2)

## 2021-10-27 NOTE — Discharge Instructions (Signed)
Continue taking home medications as prescribed.  Call doctor Anne Fu' office tomorrow to set up a follow up appointment. Return to the emergency room if you develop severe worsening chest pain, difficulty breathing, any new or worsening, or concerning symptoms

## 2021-10-27 NOTE — ED Provider Notes (Signed)
[facility] EMERGENCY DEPARTMENT Provider Note   CSN: [id number] Arrival date & time: 10/27/21  1142     History  Chief Complaint  Patient presents with   Chest Pain    Thomas Thomas is a 66 y.o. male presenting for evaluation of chest pain.  Patient states for the past 3 nights he has had intermittent chest pain.  He has been completely chest pain-free today without intervention.  He states last week was extremely stressful for him at work, he did have periods of elevated blood pressure which improved when he sat down and took a drink of water.  He has a history of CAD with CABG in 2017, follows regularly with Dr. Marlou Porch with cardiology.  He has been taking all his medication as prescribed.  When he has his pain, he has no associated shortness of breath, clamminess, nausea, vomiting.  Nothing makes his pain better or worse.  It does not wake awaken from sleep at night  HPI     Home Medications Prior to Admission medications   Medication Sig Start Date End Date Taking? Authorizing Provider  aspirin EC 81 MG EC tablet Take 1 tablet (81 mg total) by mouth daily. 01/09/16   Barrett, Erin R, PA-C  carvedilol (COREG) 6.25 MG tablet Take 1 tablet (6.25 mg total) by mouth 2 (two) times daily with a meal. 06/26/21   Jerline Pain, MD  ibuprofen (ADVIL) 600 MG tablet Take 1 tablet (600 mg total) by mouth every 8 (eight) hours as needed. 09/01/21   Ivy Lynn, NP  lisinopril (ZESTRIL) 20 MG tablet Take 1 tablet by mouth once daily 09/01/21   Jerline Pain, MD  ofloxacin (FLOXIN OTIC) 0.3 % OTIC solution Place 10 drops into the right ear daily. 09/01/21   Ivy Lynn, NP  rosuvastatin (CRESTOR) 20 MG tablet Take 1 tablet by mouth once daily 12/05/20   Jerline Pain, MD  sildenafil (REVATIO) 20 MG tablet Take 2-5 tabs PRN prior to sexual activity 11/06/20   Gwenlyn Perking, FNP      Allergies    Patient has no known allergies.    Review of Systems   Review of Systems   Cardiovascular:  Positive for chest pain (intermittent, none currently).  All other systems reviewed and are negative.  Physical Exam Updated Vital Signs BP (!) 169/84    Pulse 73    Temp 97.7 F (36.5 C) (Oral)    Resp 16    SpO2 98%  Physical Exam Vitals and nursing note reviewed.  Constitutional:      General: He is not in acute distress.    Appearance: Normal appearance.     Comments: Resting in the bed in NAD  HENT:     Head: Normocephalic and atraumatic.  Eyes:     Conjunctiva/sclera: Conjunctivae normal.     Pupils: Pupils are equal, round, and reactive to light.  Cardiovascular:     Rate and Rhythm: Normal rate and regular rhythm.     Pulses: Normal pulses.  Pulmonary:     Effort: Pulmonary effort is normal. No respiratory distress.     Breath sounds: Normal breath sounds. No wheezing.     Comments: Speaking in full sentences.  Clear lung sounds in all fields. Abdominal:     General: There is no distension.     Palpations: Abdomen is soft. There is no mass.     Tenderness: There is no abdominal tenderness. There is no guarding or rebound.  Musculoskeletal:        General: Normal range of motion.     Cervical back: Normal range of motion and neck supple.     Right lower leg: No edema.     Left lower leg: No edema.  Skin:    General: Skin is warm and dry.     Capillary Refill: Capillary refill takes less than 2 seconds.  Neurological:     Mental Status: He is alert and oriented to person, place, and time.  Psychiatric:        Mood and Affect: Mood and affect normal.        Speech: Speech normal.        Behavior: Behavior normal.    ED Results / Procedures / Treatments   Labs (all labs ordered are listed, but only abnormal results are displayed) Labs Reviewed  BASIC METABOLIC PANEL - Abnormal; Notable for the following components:      Result Value   Sodium 133 (*)    Glucose, Bld 114 (*)    Creatinine, Ser 1.49 (*)    GFR, Estimated 51 (*)    All other  components within normal limits  CBC - Abnormal; Notable for the following components:   WBC 10.8 (*)    All other components within normal limits  TROPONIN I (HIGH SENSITIVITY)  TROPONIN I (HIGH SENSITIVITY)    EKG EKG Interpretation  Date/Time:  Monday October 27 2021 12:03:08 EST Ventricular Rate:  77 PR Interval:  158 QRS Duration: 82 QT Interval:  374 QTC Calculation: 423 R Axis:   90 Text Interpretation: Normal sinus rhythm Rightward axis Confirmed by Edwin Dada ([id number]) on 10/27/2021 2:11:58 PM  Radiology DG Chest 2 View  Result Date: 10/27/2021 CLINICAL DATA:  Chest pain. EXAM: CHEST - 2 VIEW COMPARISON:  February 05, 2016. FINDINGS: The heart size and mediastinal contours are within normal limits. Both lungs are clear. Status post coronary bypass graft. The visualized skeletal structures are unremarkable. IMPRESSION: No active cardiopulmonary disease. Electronically Signed   By: Lupita Raider M.D.   On: 10/27/2021 12:18    Procedures Procedures    Medications Ordered in ED Medications - No data to display  ED Course/ Medical Decision Making/ A&P                           Medical Decision Making   This patient presents to the ED for concern of chest pain. This involves an extensive number of treatment options, and is a complaint that carries with it a high risk of complications and morbidity.  The differential diagnosis includes ACS, infection, viral illness, GERD, anxiety.   Co morbidities: CAD status post CABG x4, htn   Additional history: Reviewed cardiology notes including office visit from fall 2022. Last cardiac imaging/tests in 2017.   Lab Tests:  I ordered, and personally interpreted labs.  The pertinent results include: Negative troponins x2.  Kidney function slightly elevated from baseline, however not double.  Does not meet criteria for AKI.  Likely due to recently elevated blood pressure and to be followed up with cardiology   Imaging Studies: I  ordered imaging studies including chest x-ray. I independently visualized and interpreted imaging which showed no pneumonia, pneumothorax, effusion I agree with the radiologist interpretation   Cardiac Monitoring:  The patient was maintained on a cardiac monitor.  I personally viewed and interpreted the cardiac monitored which showed an underlying rhythm of: NSR  Dispostion:  After consideration of the diagnostic results and the patients response to treatment, I feel that the patent would benefit from discharge home with close outpatient follow-up with cardiology.  Symptoms very atypical for ACS, and with normal troponins x2, there is no evidence of acute damage to the myocardium.  Discussed findings and plan with patient, who is agreeable.  He will contact Dr. Marlou Porch office to set up a follow-up appointment.  Encouraged prompt return to the ER with any worsening or change chest pain.  At this time, patient appears safe for discharge.  Patient states he understands and agrees to plan.   Final Clinical Impression(s) / ED Diagnoses Final diagnoses:  Atypical chest pain  Hypertension, unspecified type    Rx / DC Orders ED Discharge Orders     None         Franchot Heidelberg, PA-C 10/27/21 1505    Truddie Hidden, MD 10/27/21 2146

## 2021-10-27 NOTE — ED Triage Notes (Signed)
Chest pain onset 3 days ago

## 2021-10-29 ENCOUNTER — Ambulatory Visit: Payer: BC Managed Care – PPO | Admitting: Family Medicine

## 2021-10-29 ENCOUNTER — Encounter: Payer: Self-pay | Admitting: Family Medicine

## 2021-10-29 VITALS — BP 148/77 | HR 74 | Temp 98.0°F | Ht 67.0 in | Wt 214.0 lb

## 2021-10-29 DIAGNOSIS — R7989 Other specified abnormal findings of blood chemistry: Secondary | ICD-10-CM

## 2021-10-29 DIAGNOSIS — Z951 Presence of aortocoronary bypass graft: Secondary | ICD-10-CM

## 2021-10-29 DIAGNOSIS — R079 Chest pain, unspecified: Secondary | ICD-10-CM | POA: Diagnosis not present

## 2021-10-29 LAB — BMP8+EGFR
BUN/Creatinine Ratio: 11 (ref 10–24)
BUN: 17 mg/dL (ref 8–27)
CO2: 21 mmol/L (ref 20–29)
Calcium: 9.4 mg/dL (ref 8.6–10.2)
Chloride: 102 mmol/L (ref 96–106)
Creatinine, Ser: 1.5 mg/dL — ABNORMAL HIGH (ref 0.76–1.27)
Glucose: 124 mg/dL — ABNORMAL HIGH (ref 70–99)
Potassium: 4.4 mmol/L (ref 3.5–5.2)
Sodium: 136 mmol/L (ref 134–144)
eGFR: 51 mL/min/{1.73_m2} — ABNORMAL LOW (ref >59)

## 2021-10-29 NOTE — Patient Instructions (Signed)
Follow up with cardiology as discussed.  Any new, worsening, or persistent symptoms - return to ED.

## 2021-10-29 NOTE — Progress Notes (Signed)
Subjective:  Patient ID: Thomas Thomas, male    DOB: 03-11-1956, 66 y.o.   MRN: [id number]  Patient Care Team: Gwenlyn Perking, FNP as PCP - General (Family Medicine) Jerline Pain, MD as PCP - Cardiology (Cardiology)   Chief Complaint:  Follow-up (Atypical chest pain)   HPI: Thomas Thomas is a 66 y.o. male presenting on 10/29/2021 for Follow-up (Atypical chest pain)   Patient presents today for follow-up after ED visit for atypical chest pain.  Patient states prior to ED visit he had worked 98 hours under stressful conditions prior weeks.  States that he has had intermittent, left-sided, sharp/shooting chest discomfort.  The discomfort was 6-8 out of 10 at worst and only lasted for a brief second.  He went to the ED for evaluation.  History of coronary artery disease/CABG x4.  Pain had resolved at time of arrival to the ED.  Work-up including chest x-ray and serial troponins was negative. Creatinine was slightly elevated. He was discharged home with instructions to follow up with cardiology.  States since discharge from the hospital he has not been back to work and has not experienced any recurrent chest pain.  He denies any associated palpitations, dizziness, syncope, diaphoresis, shortness of breath, orthopnea, PND, nausea, vomiting, pallor, leg swelling, or fatigue. He has an appointment with cardiology scheduled.     Relevant past medical, surgical, family, and social history reviewed and updated as indicated.  Allergies and medications reviewed and updated. Data reviewed: Chart in Epic.   Past Medical History:  Diagnosis Date   Anxiety    Diabetes mellitus type II, non insulin dependent ([facility]) 11/2015   Hypertension    NSTEMI (non-ST elevated myocardial infarction) ([facility]) 12/23/2015    Past Surgical History:  Procedure Laterality Date   CARDIAC CATHETERIZATION N/A 12/25/2015   Procedure: Left Heart Cath and Coronary Angiography;  Surgeon: Peter M Martinique, MD;  Location: [facility];  Service: Cardiovascular;  Laterality: N/A;   CORONARY ARTERY BYPASS GRAFT N/A 01/02/2016   Procedure: CORONARY ARTERY BYPASS GRAFTING (CABG)X 4 LIMA-LAD; SVG-RCA(ENDARDERECTOMY); SVG-DIAG; SVG-OM ENDOSCOPIC GREATER RIGHT SAPHENOUS VEIN HARVEST;  Surgeon: Ivin Poot, MD; LIMA-LAD, SVG-D, SVG-OM, SVG-RCA-PDA     HERNIA REPAIR     KNEE SURGERY     TEE WITHOUT CARDIOVERSION N/A 01/02/2016   Procedure: TRANSESOPHAGEAL ECHOCARDIOGRAM (TEE);  Surgeon: Ivin Poot, MD;  Location: [facility];  Service: Open Heart Surgery;  Laterality: N/A;   TONSILLECTOMY     US ECHOCARDIOGRAPHY  12/24/2015   EF 55-60%, grade 1 dd    Social History   Socioeconomic History   Marital status: Married    Spouse name: Not on file   Number of children: Not on file   Years of education: Not on file   Highest education level: Not on file  Occupational History   Not on file  Tobacco Use   Smoking status: Former    Types: Cigarettes    Quit date: 06/06/2012    Years since quitting: 9.4   Smokeless tobacco: Never  Vaping Use   Vaping Use: Never used  Substance and Sexual Activity   Alcohol use: No   Drug use: No   Sexual activity: Not on file  Other Topics Concern   Not on file  Social History Narrative   Not on file   Social Determinants of Health   Financial Resource Strain: Not on file  Food Insecurity: Not on file  Transportation Needs: Not on file  Physical Activity: Not on file  Stress: Not on file  Social Connections: Not on file  Intimate Partner Violence: Not on file    Outpatient Encounter Medications as of 10/29/2021  Medication Sig   aspirin EC 81 MG EC tablet Take 1 tablet (81 mg total) by mouth daily.   carvedilol (COREG) 6.25 MG tablet Take 1 tablet (6.25 mg total) by mouth 2 (two) times daily with a meal.   lisinopril (ZESTRIL) 20 MG tablet Take 1 tablet by mouth once daily   rosuvastatin (CRESTOR) 20 MG tablet Take 1 tablet by mouth once daily   sildenafil (REVATIO)  20 MG tablet Take 2-5 tabs PRN prior to sexual activity   [DISCONTINUED] ibuprofen (ADVIL) 600 MG tablet Take 1 tablet (600 mg total) by mouth every 8 (eight) hours as needed. (Patient not taking: Reported on 10/29/2021)   [DISCONTINUED] ofloxacin (FLOXIN OTIC) 0.3 % OTIC solution Place 10 drops into the right ear daily.   No facility-administered encounter medications on file as of 10/29/2021.    No Known Allergies  Review of Systems  Constitutional:  Negative for activity change, appetite change, chills, diaphoresis, fatigue, fever and unexpected weight change.  HENT: Negative.    Eyes: Negative.   Respiratory:  Negative for cough, chest tightness and shortness of breath.   Cardiovascular:  Negative for chest pain, palpitations and leg swelling.  Gastrointestinal:  Negative for abdominal pain, blood in stool, constipation, diarrhea, nausea and vomiting.  Endocrine: Negative.   Genitourinary:  Negative for dysuria, frequency and urgency.  Musculoskeletal:  Negative for arthralgias and myalgias.  Skin: Negative.  Negative for color change and pallor.  Allergic/Immunologic: Negative.   Neurological:  Negative for dizziness, tremors, seizures, syncope, facial asymmetry, speech difficulty, weakness, light-headedness, numbness and headaches.  Hematological: Negative.   Psychiatric/Behavioral:  Negative for confusion, hallucinations, sleep disturbance and suicidal ideas.   All other systems reviewed and are negative.      Objective:  BP (!) 148/77    Pulse 74    Temp 98 F (36.7 C)    Ht _0  (1.702 m)    Wt 214 lb (97.1 kg)    SpO2 94%    BMI 33.52 kg/m    Wt Readings from Last 3 Encounters:  10/29/21 214 lb (97.1 kg)  09/01/21 215 lb 8 oz (97.8 kg)  08/08/21 210 lb (95.3 kg)    Physical Exam Vitals and nursing note reviewed.  Constitutional:      General: He is not in acute distress.    Appearance: Normal appearance. He is well-developed and well-groomed. He is obese. He is not  ill-appearing, toxic-appearing or diaphoretic.  HENT:     Head: Normocephalic and atraumatic.     Jaw: There is normal jaw occlusion.     Right Ear: Hearing normal.     Left Ear: Hearing normal.     Nose: Nose normal.     Mouth/Throat:     Lips: Pink.     Mouth: Mucous membranes are moist.     Pharynx: Oropharynx is clear. Uvula midline.  Eyes:     General: Lids are normal.     Extraocular Movements: Extraocular movements intact.     Conjunctiva/sclera: Conjunctivae normal.     Pupils: Pupils are equal, round, and reactive to light.  Neck:     Thyroid: No thyroid mass, thyromegaly or thyroid tenderness.     Vascular: No carotid bruit or JVD.     Trachea: Trachea and phonation normal.  Cardiovascular:  Rate and Rhythm: Normal rate and regular rhythm.     Chest Wall: PMI is not displaced.     Pulses: Normal pulses.     Heart sounds: Normal heart sounds. No murmur heard.   No friction rub. No gallop.  Pulmonary:     Effort: Pulmonary effort is normal. No respiratory distress.     Breath sounds: Normal breath sounds. No wheezing.  Abdominal:     General: There is no abdominal bruit.     Palpations: There is no hepatomegaly or splenomegaly.  Musculoskeletal:        General: Normal range of motion.     Cervical back: Normal range of motion and neck supple.     Right lower leg: No edema.     Left lower leg: No edema.  Lymphadenopathy:     Cervical: No cervical adenopathy.  Skin:    General: Skin is warm and dry.     Capillary Refill: Capillary refill takes less than 2 seconds.     Coloration: Skin is not cyanotic, jaundiced or pale.     Findings: No rash.  Neurological:     General: No focal deficit present.     Mental Status: He is alert and oriented to person, place, and time.     Sensory: Sensation is intact.     Motor: Motor function is intact.     Coordination: Coordination is intact.     Gait: Gait is intact.     Deep Tendon Reflexes: Reflexes are normal and  symmetric.  Psychiatric:        Attention and Perception: Attention and perception normal.        Mood and Affect: Mood and affect normal.        Speech: Speech normal.        Behavior: Behavior normal. Behavior is cooperative.        Thought Content: Thought content normal.        Cognition and Memory: Cognition and memory normal.        Judgment: Judgment normal.    Results for orders placed or performed during the hospital encounter of 10/62/69  Basic metabolic panel  Result Value Ref Range   Sodium 133 (L) 135 - 145 mmol/L   Potassium 3.8 3.5 - 5.1 mmol/L   Chloride 99 98 - 111 mmol/L   CO2 24 22 - 32 mmol/L   Glucose, Bld 114 (H) 70 - 99 mg/dL   BUN 19 8 - 23 mg/dL   Creatinine, Ser 1.49 (H) 0.61 - 1.24 mg/dL   Calcium 8.9 8.9 - 10.3 mg/dL   GFR, Estimated 51 (L) >60 mL/min   Anion gap 10 5 - 15  CBC  Result Value Ref Range   WBC 10.8 (H) 4.0 - 10.5 K/uL   RBC 4.75 4.22 - 5.81 MIL/uL   Hemoglobin 14.0 13.0 - 17.0 g/dL   HCT 43.8 39.0 - 52.0 %   MCV 92.2 80.0 - 100.0 fL   MCH 29.5 26.0 - 34.0 pg   MCHC 32.0 30.0 - 36.0 g/dL   RDW 14.6 11.5 - 15.5 %   Platelets 200 150 - 400 K/uL   nRBC 0.0 0.0 - 0.2 %  Troponin I (High Sensitivity)  Result Value Ref Range   Troponin I (High Sensitivity) 8 <18 ng/L  Troponin I (High Sensitivity)  Result Value Ref Range   Troponin I (High Sensitivity) 7 <18 ng/L       Pertinent labs & imaging results that were  available during my care of the patient were reviewed by me and considered in my medical decision making.  Assessment & Plan:  Thomas Thomas was seen today for follow-up.  Diagnoses and all orders for this visit:  Nonspecific chest pain S/P CABG x 4 Symptoms have resolved.  Work-up in ED, including serial troponins, negative for ischemic event.  Has follow-up with cardiology scheduled. Aspirin, statin, and beta-blocker therapy.  Patient aware of red flags which require emergent evaluation and treatment.  Would benefit from  stress testing in an outpatient setting.  Elevated serum creatinine Elevated during ED visit.  We will recheck today.  Adequate hydration discussed in detail. -     BMP8+EGFR     Continue all other maintenance medications.  Follow up plan: Return if symptoms worsen or fail to improve.   Continue healthy lifestyle choices, including diet (rich in fruits, vegetables, and lean proteins, and low in salt and simple carbohydrates) and exercise (at least 30 minutes of moderate physical activity daily).  Educational handout given for nonspecific chest pain  The above assessment and management plan was discussed with the patient. The patient verbalized understanding of and has agreed to the management plan. Patient is aware to call the clinic if they develop any new symptoms or if symptoms persist or worsen. Patient is aware when to return to the clinic for a follow-up visit. Patient educated on when it is appropriate to go to the emergency department.   Monia Pouch, FNP-C [facility] [phone]

## 2021-10-30 ENCOUNTER — Other Ambulatory Visit: Payer: Self-pay | Admitting: Family Medicine

## 2021-10-30 DIAGNOSIS — R7989 Other specified abnormal findings of blood chemistry: Secondary | ICD-10-CM

## 2021-10-30 DIAGNOSIS — Z029 Encounter for administrative examinations, unspecified: Secondary | ICD-10-CM

## 2021-11-25 ENCOUNTER — Other Ambulatory Visit: Payer: Self-pay | Admitting: Cardiology

## 2022-03-31 ENCOUNTER — Ambulatory Visit: Payer: BC Managed Care – PPO | Admitting: Family

## 2022-03-31 DIAGNOSIS — J069 Acute upper respiratory infection, unspecified: Secondary | ICD-10-CM | POA: Diagnosis not present

## 2022-03-31 MED ORDER — FLUTICASONE PROPIONATE 50 MCG/ACT NA SUSP: 2.0000 | Freq: Every day | NASAL | 6 refills | Status: DC

## 2022-03-31 MED ORDER — BENZONATATE 200 MG PO CAPS: 200.0000 mg | ORAL_CAPSULE | Freq: Three times a day (TID) | ORAL | 1 refills | Status: DC | PRN

## 2022-03-31 MED ORDER — CETIRIZINE HCL 10 MG PO TABS: 10.0000 mg | ORAL_TABLET | Freq: Every day | ORAL | 1 refills | Status: DC

## 2022-03-31 NOTE — Patient Instructions (Signed)

## 2022-03-31 NOTE — Progress Notes (Signed)
Virtual Visit  Note Due to COVID-19 pandemic this visit was conducted virtually. This visit type was conducted due to national recommendations for restrictions regarding the COVID-19 Pandemic (e.g. social distancing, sheltering in place) in an effort to limit this patient's exposure and mitigate transmission in our community. All issues noted in this document were discussed and addressed.  A physical exam was not performed with this format.  I connected with Thomas Thomas on 03/31/22 at 1:47 pm  by telephone and verified that I am speaking with the correct person using two identifiers. Thomas Thomas is currently located at home and no one is currently with him during visit. The provider, Jannifer Rodney, FNP is located in their office at time of visit.  I discussed the limitations, risks, security and privacy concerns of performing an evaluation and management service by telephone and the availability of in person appointments. I also discussed with the patient that there may be a patient responsible charge related to this service. The patient expressed understanding and agreed to proceed.   History and Present Illness:  Pt call the office today with a URI symptoms that started this morning. Took two COVID tests that was negative.  Cough This is a new problem. The current episode started today. The problem has been gradually worsening. The problem occurs constantly. The cough is Productive of sputum. Associated symptoms include myalgias, nasal congestion, postnasal drip, rhinorrhea, shortness of breath and wheezing. Pertinent negatives include no chills, ear congestion, ear pain, fever or headaches. He has tried rest for the symptoms. The treatment provided mild relief.    Review of Systems  Constitutional:  Negative for chills and fever.  HENT:  Positive for postnasal drip and rhinorrhea. Negative for ear pain.   Respiratory:  Positive for cough, shortness of breath and wheezing.    Musculoskeletal:  Positive for myalgias.  Neurological:  Negative for headaches.  All other systems reviewed and are negative.   Observations/Objective: No SOB or distress noted   Assessment and Plan: 1. Viral URI with cough - Take meds as prescribed - Use a cool mist humidifier  -Use saline nose sprays frequently -Force fluids -For any cough or congestion  Use plain Mucinex- regular strength or max strength is fine -For fever or aces or pains- take tylenol or ibuprofen. -Throat lozenges if help -Follow up if symptoms worsen or do not improve  - fluticasone (FLONASE) 50 MCG/ACT nasal spray; Place 2 sprays into both nostrils daily.  Dispense: 16 g; Refill: 6 - cetirizine (ZYRTEC ALLERGY) 10 MG tablet; Take 1 tablet (10 mg total) by mouth daily.  Dispense: 90 tablet; Refill: 1 - benzonatate (TESSALON) 200 MG capsule; Take 1 capsule (200 mg total) by mouth 3 (three) times daily as needed.  Dispense: 30 capsule; Refill: 1     I discussed the assessment and treatment plan with the patient. The patient was provided an opportunity to ask questions and all were answered. The patient agreed with the plan and demonstrated an understanding of the instructions.   The patient was advised to call back or seek an in-person evaluation if the symptoms worsen or if the condition fails to improve as anticipated.  The above assessment and management plan was discussed with the patient. The patient verbalized understanding of and has agreed to the management plan. Patient is aware to call the clinic if symptoms persist or worsen. Patient is aware when to return to the clinic for a follow-up visit. Patient educated on when it is appropriate  to go to the emergency department.   Time call ended:  1:58 pm   I provided 11 minutes of  non face-to-face time during this encounter.    Jannifer Rodney, FNP

## 2022-04-02 ENCOUNTER — Telehealth: Payer: Self-pay | Admitting: Family Medicine

## 2022-04-02 MED ORDER — DOXYCYCLINE HYCLATE 100 MG PO TABS: 100.0000 mg | ORAL_TABLET | Freq: Two times a day (BID) | ORAL | 0 refills | Status: DC

## 2022-04-02 NOTE — Telephone Encounter (Signed)
Doxycyline Prescription sent to pharmacy   

## 2022-04-02 NOTE — Telephone Encounter (Signed)
Patient had appt 6/6 over the phone with [facility] and said he was told to call back if he thought he needed antibiotics. He feels like he is not getting any better and would like to have antibiotics called in to [facility] in [place]. Please call him back to let him know when prescription is sent.

## 2022-04-02 NOTE — Telephone Encounter (Signed)
Patient aware.

## 2022-05-14 ENCOUNTER — Telehealth: Payer: Self-pay

## 2022-05-14 NOTE — Chronic Care Management (AMB) (Signed)
  Care Coordination  Note  05/14/2022 Name: Thomas Thomas MRN: [id number] DOB: 1956-10-13  [name] is a 66 y.o. year old male who is a primary care patient of Gabriel Earing, FNP. I reached out to [name] by phone today to offer care coordination services.      [name] was given information about Care Coordination services today including:  The Care Coordination services include support from the care team which includes your Nurse Coordinator, Clinical Social Worker, or Pharmacist.  The Care Coordination team is here to help remove barriers to the health concerns and goals most important to you. Care Coordination services are voluntary and the patient may decline or stop services at any time by request to their care team member.   Patient did not agree to participate in care coordination services at this time.  Follow up plan: Patient declines participation in care coordination services.   Penne Lash, RMA Care Guide [facility]  [place], [place] [address] Direct Dial: [phone] [email or web address]

## 2022-06-17 ENCOUNTER — Ambulatory Visit: Payer: BC Managed Care – PPO | Admitting: Nurse Practitioner

## 2022-06-17 ENCOUNTER — Encounter: Payer: Self-pay | Admitting: Nurse Practitioner

## 2022-06-17 DIAGNOSIS — R052 Subacute cough: Secondary | ICD-10-CM | POA: Diagnosis not present

## 2022-06-17 MED ORDER — PREDNISONE 20 MG PO TABS: 20.0000 mg | ORAL_TABLET | Freq: Every day | ORAL | 0 refills | Status: DC

## 2022-06-17 MED ORDER — BENZONATATE 200 MG PO CAPS
200.0000 mg | ORAL_CAPSULE | Freq: Two times a day (BID) | ORAL | 0 refills | Status: DC | PRN
Start: 2022-06-17 — End: 2022-11-02

## 2022-06-17 MED ORDER — DM-GUAIFENESIN ER 30-600 MG PO TB12: 1.0000 | ORAL_TABLET | Freq: Two times a day (BID) | ORAL | 0 refills | Status: DC

## 2022-06-17 NOTE — Progress Notes (Signed)
   Virtual Visit  Note Due to COVID-19 pandemic this visit was conducted virtually. This visit type was conducted due to national recommendations for restrictions regarding the COVID-19 Pandemic (e.g. social distancing, sheltering in place) in an effort to limit this patient's exposure and mitigate transmission in our community. All issues noted in this document were discussed and addressed.  A physical exam was not performed with this format.  I connected with Thomas Thomas on 06/17/22 at 8:50 am  by telephone and verified that I am speaking with the correct person using two identifiers. Thomas Thomas is currently located at home during visit. The provider, Daryll Drown, NP is located in their office at time of visit.  I discussed the limitations, risks, security and privacy concerns of performing an evaluation and management service by telephone and the availability of in person appointments. I also discussed with the patient that there may be a patient responsible charge related to this service. The patient expressed understanding and agreed to proceed.   History and Present Illness:  Cough This is a new problem. Episode onset: in the past 3 days. The problem has been gradually worsening. The cough is Productive of sputum. Pertinent negatives include no chest pain, chills, ear congestion, ear pain, nasal congestion, rash or shortness of breath. Nothing aggravates the symptoms. He has tried OTC cough suppressant for the symptoms. The treatment provided no relief.      Review of Systems  Constitutional: Negative.  Negative for chills.  HENT: Negative.  Negative for ear pain.   Eyes: Negative.   Respiratory:  Positive for cough. Negative for shortness of breath.   Cardiovascular:  Negative for chest pain.  Skin: Negative.  Negative for itching and rash.  All other systems reviewed and are negative.    Observations/Objective: Tele-visit patient not in distress  Assessment and  Plan: Patient presents with unresolved cough.  Patient denies fever, body aches, nausea or vomiting. Take meds as prescribed - Use a cool mist humidifier  -Use saline nose sprays frequently -Force fluids -For fever or aches or pains- take Tylenol or ibuprofen. -Guaifenesin for cough and congestion -Prednisone 20 mg tablet by mouth daily for 6 days -Tessalon Perles for cough -If symptoms do not improve, she may need to be COVID tested to rule this out   Follow Up Instructions: Follow up with worsening unresolved symptoms     I discussed the assessment and treatment plan with the patient. The patient was provided an opportunity to ask questions and all were answered. The patient agreed with the plan and demonstrated an understanding of the instructions.   The patient was advised to call back or seek an in-person evaluation if the symptoms worsen or if the condition fails to improve as anticipated.  The above assessment and management plan was discussed with the patient. The patient verbalized understanding of and has agreed to the management plan. Patient is aware to call the clinic if symptoms persist or worsen. Patient is aware when to return to the clinic for a follow-up visit. Patient educated on when it is appropriate to go to the emergency department.   Time call ended: 9:00 am    I provided  11 minutes of  non face-to-face time during this encounter.    Daryll Drown, NP

## 2022-08-14 ENCOUNTER — Other Ambulatory Visit: Payer: Self-pay | Admitting: Cardiology

## 2022-08-16 ENCOUNTER — Other Ambulatory Visit: Payer: Self-pay | Admitting: Cardiology

## 2022-09-03 ENCOUNTER — Other Ambulatory Visit: Payer: Self-pay | Admitting: Cardiology

## 2022-09-09 ENCOUNTER — Other Ambulatory Visit: Payer: Self-pay | Admitting: Cardiology

## 2022-11-02 ENCOUNTER — Ambulatory Visit (INDEPENDENT_AMBULATORY_CARE_PROVIDER_SITE_OTHER): Payer: PPO | Admitting: Family Medicine

## 2022-11-02 ENCOUNTER — Encounter: Payer: Self-pay | Admitting: Family Medicine

## 2022-11-02 VITALS — BP 140/72 | HR 95 | Temp 98.4°F | Ht 67.0 in | Wt 200.1 lb

## 2022-11-02 DIAGNOSIS — I2510 Atherosclerotic heart disease of native coronary artery without angina pectoris: Secondary | ICD-10-CM

## 2022-11-02 DIAGNOSIS — N529 Male erectile dysfunction, unspecified: Secondary | ICD-10-CM

## 2022-11-02 DIAGNOSIS — M62838 Other muscle spasm: Secondary | ICD-10-CM

## 2022-11-02 DIAGNOSIS — Z951 Presence of aortocoronary bypass graft: Secondary | ICD-10-CM

## 2022-11-02 DIAGNOSIS — I2583 Coronary atherosclerosis due to lipid rich plaque: Secondary | ICD-10-CM

## 2022-11-02 DIAGNOSIS — I10 Essential (primary) hypertension: Secondary | ICD-10-CM

## 2022-11-02 MED ORDER — SILDENAFIL CITRATE 20 MG PO TABS: ORAL_TABLET | ORAL | 3 refills | Status: AC

## 2022-11-02 MED ORDER — TIZANIDINE HCL 4 MG PO TABS: 4.0000 mg | ORAL_TABLET | Freq: Four times a day (QID) | ORAL | 0 refills | Status: DC | PRN

## 2022-11-02 NOTE — Progress Notes (Signed)
Acute Office Visit  Subjective:     Patient ID: Thomas Thomas, male    DOB: 1956-05-18, 67 y.o.   MRN: [id number]  Chief Complaint  Patient presents with   Shoulder Pain    HPI Patient is in today for right upper back pain for 1 week. It is a spot between his spine and shoulder blade. He pulled a muscle in this area many years ago and has flares of pain in this area since then. He has been moving a lot of heavy boxes over the last week and this has flared up the pain again. It is a constant dull ache. He has had spasms in the area that have been very painful. He can feel a knot in the area at times. Massage and ice has been mildy helpful. Denies new injury. He has not taken any OTC medications.   He has an upcoming appt with cardiology for his follow up. They prescribe his crestor, lisinopril, and coreg. He has a his of CAD with CABG x 4. He is currently out of his medications as he has not followed up as directed. He has been checking his BP at home. This morning his BP was 140/72. He denies chest pain.    ROS As per HPI.      Objective:    BP (!) 140/72 Comment: at home reading per pt  Pulse 95   Temp 98.4 F (36.9 C) (Temporal)   Ht 5\' 7"  (1.702 m)   Wt 200 lb 2 oz (90.8 kg)   SpO2 95%   BMI 31.34 kg/m    Physical Exam Vitals and nursing note reviewed.  Constitutional:      General: He is not in acute distress.    Appearance: He is not ill-appearing, toxic-appearing or diaphoretic.  Pulmonary:     Effort: Pulmonary effort is normal. No respiratory distress.  Musculoskeletal:     Thoracic back: Spasms and tenderness present. No swelling, edema, deformity, signs of trauma or bony tenderness. Normal range of motion.       Back:     Right lower leg: No edema.     Left lower leg: No edema.     Comments: Tenderness to area pictured above.   Skin:    General: Skin is warm and dry.  Neurological:     General: No focal deficit present.     Mental Status: He is alert  and oriented to person, place, and time.  Psychiatric:        Mood and Affect: Mood normal.        Behavior: Behavior normal.     No results found for any visits on 11/02/22.      Assessment & Plan:   Thomas Thomas was seen today for shoulder pain.  Diagnoses and all orders for this visit:  Muscle spasm Tizanidine as below. Discussed ice, heat, massage, rest. Discussed referral to PT if no improvement.  -     tiZANidine (ZANAFLEX) 4 MG tablet; Take 1 tablet (4 mg total) by mouth every 6 (six) hours as needed for muscle spasms.  Erectile dysfunction, unspecified erectile dysfunction type Refills provided.  -     sildenafil (REVATIO) 20 MG tablet; Take 2-5 tabs PRN prior to sexual activity  Primary hypertension BP not quite at goal today. Continue to monitor at home. He is currently out of lisinopril, which is prescribed by cardiology due to inadequate follow up. He has an appointment scheduled next week.   Coronary artery disease  due to lipid rich plaque S/P CABG x 4 Managed by cardiology on statin and coreg.    Return in about 6 months (around 05/03/2023) for schedule CPE.  Gwenlyn Perking, FNP

## 2022-11-02 NOTE — Patient Instructions (Signed)
Muscle Cramps and Spasms Muscle cramps and spasms occur when a muscle or muscles tighten and you have no control over this tightening (involuntary muscle contraction). They are a common problem and can develop in any muscle. The most common place is in the calf muscles of the leg. Muscle cramps and muscle spasms are both involuntary muscle contractions, but there are some differences between the two: Muscle cramps are painful. They come and go and may last for a few seconds or up to 15 minutes. Muscle cramps are often more forceful and last longer than muscle spasms. Muscle spasms may or may not be painful. They may also last just a few seconds or much longer. Certain medical conditions, such as diabetes or Parkinson's disease, can make it more likely to develop cramps or spasms. However, cramps or spasms are usually not caused by a serious underlying problem. Common causes include: Doing more physical work or exercise than your body is ready for (overexertion). Overuse from repeating certain movements too many times. Remaining in a certain position for a long period of time. Improper preparation, form, or technique while playing a sport or doing an activity. Dehydration. Injury. Side effects of some medicines. Abnormally low levels of the salts and minerals in your blood (electrolytes), especially potassium and calcium. This could happen if you are taking water pills (diuretics) or if you are pregnant. In many cases, the cause of muscle cramps or spasms is not known. Follow these instructions at home: Managing pain and stiffness     Try massaging, stretching, and relaxing the affected muscle. Do this for several minutes at a time. If directed, apply heat to tight or tense muscles as often as told by your health care provider. Use the heat source that your health care provider recommends, such as a moist heat pack or a heating pad. Place a towel between your skin and the heat source. Leave the  heat on for 20-30 minutes. Remove the heat if your skin turns bright red. This is especially important if you are unable to feel pain, heat, or cold. You may have a greater risk of getting burned. If directed, put ice on the affected area. This may help if you are sore or have pain after a cramp or spasm. Put ice in a plastic bag. Place a towel between your skin and the bag. Leave the ice on for 20 minutes, 2-3 times a day. Try taking hot showers or baths to help relax tight muscles. Eating and drinking Drink enough fluid to keep your urine pale yellow. Staying well hydrated may help prevent cramps or spasms. Eat a healthy diet that includes plenty of nutrients to help your muscles function. A healthy diet includes fruits and vegetables, lean protein, whole grains, and low-fat or nonfat dairy products. General instructions If you are having frequent cramps, avoid intense exercise for several days. Take over-the-counter and prescription medicines only as told by your health care provider. Pay attention to any changes in your symptoms. Keep all follow-up visits as told by your health care provider. This is important. Contact a health care provider if: Your cramps or spasms get more severe or happen more often. Your cramps or spasms do not improve over time. Summary Muscle cramps and spasms occur when a muscle or muscles tighten and you have no control over this tightening (involuntary muscle contraction). The most common place for cramps or spasms to occur is in the calf muscles of the leg. Massaging, stretching, and relaxing the affected   muscle may relieve the cramp or spasm. Drink enough fluid to keep your urine pale yellow. Staying well hydrated may help prevent cramps or spasms. This information is not intended to replace advice given to you by your health care provider. Make sure you discuss any questions you have with your health care provider. Document Revised: 05/02/2021 Document  Reviewed: 05/02/2021 Elsevier Patient Education  2023 Elsevier Inc.  

## 2022-11-05 ENCOUNTER — Telehealth: Payer: Self-pay | Admitting: Cardiology

## 2022-11-05 NOTE — Telephone Encounter (Signed)
*  STAT* If patient is at the pharmacy, call can be transferred to refill team.   1. Which medications need to be refilled? (please list name of each medication and dose if known)  lisinopril (ZESTRIL) 20 MG tablet carvedilol (COREG) 6.25 MG tablet  2. Which pharmacy/location (including street and city if local pharmacy) is medication to be sent to? [facility], [place] - [address]    3. Do they need a 30 day or 90 day supply?   Patient states he has been completely out of medication for 1 week. He is requesting enough medication to last him until 1/31 appointment with [name], PA.

## 2022-11-06 MED ORDER — CARVEDILOL 6.25 MG PO TABS: 6.2500 mg | ORAL_TABLET | Freq: Two times a day (BID) | ORAL | 0 refills | Status: DC

## 2022-11-06 MED ORDER — LISINOPRIL 20 MG PO TABS: 20.0000 mg | ORAL_TABLET | Freq: Every day | ORAL | 0 refills | Status: DC

## 2022-11-06 NOTE — Telephone Encounter (Signed)
Pt's medication was sent to pt's pharmacy as requested. Confirmation received.

## 2022-11-13 ENCOUNTER — Ambulatory Visit: Payer: BC Managed Care – PPO | Admitting: Cardiology

## 2022-11-24 NOTE — Progress Notes (Unsigned)
Cardiology Office Note:    Date:  11/25/2022   ID:  Thomas Thomas, DOB September 16, 1956, MRN [id number]  PCP:  Gwenlyn Perking, FNP   HeartCare Providers Cardiologist:  Candee Furbish, MD     Referring MD: Gwenlyn Perking, FNP   Patient Profile: Coronary artery disease S/p CABG in 2017 TTE 12/24/2015: Mild LVH, EF 55-60, inferior and inferolateral HK, GR 2 DD, mild LAE, normal RVSF, RAP 15   Hypertension  Hyperlipidemia  Diabetes mellitus    History of Present Illness:   Thomas Thomas is a 67 y.o. male with the above problem list.  He was last seen by Dr. Marlou Porch via video visit 08/08/2021.  He went to the emergency room in Jan 2023 with chest pain. His hsTrops were neg x 2. He returns for f/u on CAD. He is here alone. Unfortunately, his uncle passed away this AM. He has had several family members die over the past year (aunt, sister, mother). He has not had chest pain. He has occasional L scapular pain that seems to be MSK in nature. He did have L scapular pain when he presented for his CABG. However, this is not the same discomfort. He has noted some dyspnea on exertion since he retired this past year. He has been less active. He has gained some weight as well. He has been drinking soft drinks. He has not had orthopnea, leg edema, syncope.   EKG: NSR, HR 73, Left Bundle Branch Block, QTc 462 ms      Reviewed and updated this encounter:  Tobacco  Allergies  Meds  Problems  Med Hx  Surg Hx  Fam Hx     Review of Systems  Gastrointestinal:  Negative for hematochezia and melena.  Genitourinary:  Negative for hematuria.    Labs/Other Test Reviewed:   Recent Labs: No results found for requested labs within last 365 days.   Recent Lipid Panel No results for input(s): "CHOL", "TRIG", "HDL", "VLDL", "LDLCALC", "LDLDIRECT" in the last 8760 hours.   Risk Assessment/Calculations/Metrics:         HYPERTENSION CONTROL Vitals:   11/25/22 1011 11/25/22 1121  BP: (!) 162/98 (!)  170/90    The patient's blood pressure is elevated above target today.  In order to address the patient's elevated BP: A current anti-hypertensive medication was adjusted today.      Physical Exam:   VS:  BP (!) 170/90   Pulse 74   Ht 5\' 6"  (1.676 m)   Wt 225 lb 9.6 oz (102.3 kg) Comment: when pt was at pcp should have been 223 per pt  SpO2 96%   BMI 36.41 kg/m    Wt Readings from Last 3 Encounters:  11/25/22 225 lb 9.6 oz (102.3 kg)  11/02/22 200 lb 2 oz (90.8 kg)  10/29/21 214 lb (97.1 kg)    Constitutional:      Appearance: Healthy appearance. Not in distress.  Neck:     Vascular: No carotid bruit. JVD normal.  Pulmonary:     Breath sounds: Normal breath sounds. No wheezing. No rales.  Cardiovascular:     Normal rate. Regular rhythm. Normal S1. Normal S2.      Murmurs: There is no murmur.  Edema:    Peripheral edema absent.  Abdominal:     Palpations: Abdomen is soft.          ASSESSMENT & PLAN:   LBBB (left bundle branch block) I reviewed several prior EKGs. The Left Bundle Branch Block appears  to be new. He really has not had any symptoms to suggest unstable angina.  Lexiscan Myoview Echocardiogram F/u 8 weeks   Coronary artery disease involving native coronary artery of native heart without angina pectoris S/p CABG in 2017. As noted, Left Bundle Branch Block is new. He has not had symptoms to suggest angina. He notes some shortness of breath with exertion but this seems to be due to deconditioning. He has not signs of CHF. Continue ASA 81 mg, Coreg 6.25 mg twice daily, Crestor 20 mg once daily. Obtain TTE, Myoview as noted. F/u 8 weeks.   Primary hypertension BP uncontrolled. His BP is higher today due to recent news that his uncle passed away. But his SBPs have been in the 140s.  Increase Lisinopril to 30 mg once daily BMET 2-4 weeks Continue Coreg 6.25 mg twice daily   Pure hypercholesterolemia He just had labs at his work prior to retiring. He will send  [place] a copy of the labs. Continue Crestor 20 mg once daily.           Shared Decision Making/Informed Consent The risks [chest pain, shortness of breath, cardiac arrhythmias, dizziness, blood pressure fluctuations, myocardial infarction, stroke/transient ischemic attack, nausea, vomiting, allergic reaction, radiation exposure, metallic taste sensation and life-threatening complications (estimated to be 1 in 10,000)], benefits (risk stratification, diagnosing coronary artery disease, treatment guidance) and alternatives of a nuclear stress test were discussed in detail with Thomas Thomas and he agrees to proceed.   Dispo:  Return in about 8 weeks (around 01/20/2023) for Follow up after testing w/ Dr. Marlou Porch, or [name], PA-C.   Signed, [name], PA-C  11/25/2022 11:31 AM    [facility] [address], [place], [place]  [address] Phone: [phone]; Fax: [phone]

## 2022-11-25 ENCOUNTER — Ambulatory Visit: Payer: HMO | Attending: Cardiology | Admitting: Physician Assistant

## 2022-11-25 ENCOUNTER — Encounter: Payer: Self-pay | Admitting: Physician Assistant

## 2022-11-25 VITALS — BP 170/90 | HR 74 | Ht 66.0 in | Wt 225.6 lb

## 2022-11-25 DIAGNOSIS — I447 Left bundle-branch block, unspecified: Secondary | ICD-10-CM | POA: Insufficient documentation

## 2022-11-25 DIAGNOSIS — E7800 Pure hypercholesterolemia, unspecified: Secondary | ICD-10-CM

## 2022-11-25 DIAGNOSIS — I2510 Atherosclerotic heart disease of native coronary artery without angina pectoris: Secondary | ICD-10-CM

## 2022-11-25 DIAGNOSIS — I10 Essential (primary) hypertension: Secondary | ICD-10-CM

## 2022-11-25 MED ORDER — LISINOPRIL 30 MG PO TABS
30.0000 mg | ORAL_TABLET | Freq: Every day | ORAL | 3 refills | Status: DC
Start: 2022-11-25 — End: 2023-01-08

## 2022-11-25 NOTE — Patient Instructions (Signed)
Medication Instructions:  Your physician has recommended you make the following change in your medication:   INCREASE the Lisinopril to 30 mg taking 1 daily.  You can take 1 1/2 of the 20 mg tablets daily to use them up.  *If you need a refill on your cardiac medications before your next appointment, please call your pharmacy*   Lab Work: TIME OF TESTING:  BMET  If you have labs (blood work) drawn today and your tests are completely normal, you will receive your results only by: [place] (if you have MyChart) OR A paper copy in the mail If you have any lab test that is abnormal or we need to change your treatment, we will call you to review the results.   Testing/Procedures: Your physician has requested that you have an echocardiogram. Echocardiography is a painless test that uses sound waves to create images of your heart. It provides your doctor with information about the size and shape of your heart and how well your heart's chambers and valves are working. This procedure takes approximately one hour. There are no restrictions for this procedure. Please do NOT wear cologne, perfume, aftershave, or lotions (deodorant is allowed). Please arrive 15 minutes prior to your appointment time.   Your physician has requested that you have a lexiscan myoview. For further information please visit [email or web address]. Please follow instruction sheet, BELOW:    You are scheduled for a Myocardial Perfusion Imaging Study  Please arrive 15 minutes prior to your appointment time for registration and insurance purposes.  The test will take approximately 3 to 4 hours to complete; you may bring reading material.  If someone comes with you to your appointment, they will need to remain in the main lobby due to limited space in the testing area. **If you are pregnant or breastfeeding, please notify the nuclear lab prior to your appointment**  How to prepare for your Myocardial Perfusion Test: Do  not eat or drink 3 hours prior to your test, except you may have water. Do not consume products containing caffeine (regular or decaffeinated) 12 hours prior to your test. (ex: coffee, chocolate, sodas, tea). Do bring a list of your current medications with you.  If not listed below, you may take your medications as normal. Do wear comfortable clothes (no dresses or overalls) and walking shoes, tennis shoes preferred (No heels or open toe shoes are allowed). Do NOT wear cologne, perfume, aftershave, or lotions (deodorant is allowed). If these instructions are not followed, your test will have to be rescheduled.     Follow-Up: At [facility], you and your health needs are our priority.  As part of our continuing mission to provide you with exceptional heart care, we have created designated Provider Care Teams.  These Care Teams include your primary Cardiologist (physician) and Advanced Practice Providers (APPs -  Physician Assistants and Nurse Practitioners) who all work together to provide you with the care you need, when you need it.  We recommend signing up for the patient portal called "MyChart".  Sign up information is provided on this After Visit Summary.  MyChart is used to connect with patients for Virtual Visits (Telemedicine).  Patients are able to view lab/test results, encounter notes, upcoming appointments, etc.  Non-urgent messages can be sent to your provider as well.   To learn more about what you can do with MyChart, go to [email or web address].    Your next appointment:   6-8 week(s)  Provider:   Candee Furbish, MD  or [name], PA-C         Other Instructions

## 2022-11-25 NOTE — Assessment & Plan Note (Signed)
S/p CABG in 2017. As noted, Left Bundle Branch Block is new. He has not had symptoms to suggest angina. He notes some shortness of breath with exertion but this seems to be due to deconditioning. He has not signs of CHF. Continue ASA 81 mg, Coreg 6.25 mg twice daily, Crestor 20 mg once daily. Obtain TTE, Myoview as noted. F/u 8 weeks.

## 2022-11-25 NOTE — Assessment & Plan Note (Signed)
I reviewed several prior EKGs. The Left Bundle Branch Block appears to be new. He really has not had any symptoms to suggest unstable angina.  Lexiscan Myoview Echocardiogram F/u 8 weeks

## 2022-11-25 NOTE — Assessment & Plan Note (Signed)
BP uncontrolled. His BP is higher today due to recent news that his uncle passed away. But his SBPs have been in the 140s.  Increase Lisinopril to 30 mg once daily BMET 2-4 weeks Continue Coreg 6.25 mg twice daily

## 2022-11-25 NOTE — Assessment & Plan Note (Signed)
He just had labs at his work prior to retiring. He will send [place] a copy of the labs. Continue Crestor 20 mg once daily.

## 2022-12-02 ENCOUNTER — Other Ambulatory Visit: Payer: Self-pay | Admitting: Cardiology

## 2022-12-11 ENCOUNTER — Other Ambulatory Visit: Payer: Self-pay | Admitting: Cardiology

## 2022-12-11 ENCOUNTER — Telehealth (HOSPITAL_COMMUNITY): Payer: Self-pay | Admitting: *Deleted

## 2022-12-11 NOTE — Telephone Encounter (Signed)
Pt reached and given instructions for MPI study scheduled on 12/17/22.

## 2022-12-17 ENCOUNTER — Ambulatory Visit (HOSPITAL_BASED_OUTPATIENT_CLINIC_OR_DEPARTMENT_OTHER): Payer: HMO

## 2022-12-17 ENCOUNTER — Ambulatory Visit (HOSPITAL_COMMUNITY): Payer: HMO | Attending: Cardiovascular Disease

## 2022-12-17 DIAGNOSIS — I2510 Atherosclerotic heart disease of native coronary artery without angina pectoris: Secondary | ICD-10-CM | POA: Insufficient documentation

## 2022-12-17 DIAGNOSIS — I447 Left bundle-branch block, unspecified: Secondary | ICD-10-CM

## 2022-12-17 LAB — MYOCARDIAL PERFUSION IMAGING
LV dias vol: 132 mL (ref 62–150)
LV sys vol: 76 mL
Nuc Stress EF: 42 %
Peak HR: 104 {beats}/min
Rest HR: 69 {beats}/min
Rest Nuclear Isotope Dose: 10.9 mCi
SDS: 4
SRS: 8
SSS: 12
ST Depression (mm): 0 mm
Stress Nuclear Isotope Dose: 32.5 mCi
TID: 1.08

## 2022-12-17 LAB — ECHOCARDIOGRAM COMPLETE
Area-P 1/2: 4.1 cm2
S' Lateral: 3.2 cm

## 2022-12-17 MED ORDER — TECHNETIUM TC 99M TETROFOSMIN IV KIT
10.9000 | PACK | Freq: Once | INTRAVENOUS | Status: AC | PRN
  Administered 2022-12-17: 10.9 via INTRAVENOUS

## 2022-12-17 MED ORDER — REGADENOSON 0.4 MG/5ML IV SOLN
0.4000 mg | Freq: Once | INTRAVENOUS | Status: AC
  Administered 2022-12-17: 0.4 mg via INTRAVENOUS

## 2022-12-17 MED ORDER — PERFLUTREN LIPID MICROSPHERE
1.0000 mL | INTRAVENOUS | Status: AC | PRN
  Administered 2022-12-17: 2 mL via INTRAVENOUS

## 2022-12-17 MED ORDER — TECHNETIUM TC 99M TETROFOSMIN IV KIT
32.5000 | PACK | Freq: Once | INTRAVENOUS | Status: AC | PRN
  Administered 2022-12-17: 32.5 via INTRAVENOUS

## 2022-12-21 ENCOUNTER — Telehealth: Payer: Self-pay | Admitting: Physician Assistant

## 2022-12-21 NOTE — Progress Notes (Unsigned)
Cardiology Office Note:    Date:  12/22/2022   ID:  Thomas Thomas, DOB April 16, 1956, MRN [id number]  PCP:  Thomas Perking, FNP  [facility] Providers Cardiologist:  Thomas Furbish, MD     Referring MD: Thomas Perking, FNP   Patient Profile: Coronary artery disease NSTEMI S/p CABG in 2017 TTE 12/24/2015: Mild LVH, EF 55-60, inferior and inferolateral HK, GR 2 DD, mild LAE, normal RVSF, RAP 15  Myoview 12/17/2022: Apical to mid anteroseptal and apical infarction with peri-infarction ischemia, EF 42 HFmrEF (heart failure with mildly reduced ejection fraction)  TTE 12/17/2022: Septal HK, EF 40-45, GR 1 DD, normal RVSF, trivial MR, AV sclerosis, RAP 3    Hypertension  Hyperlipidemia  Diabetes mellitus   History of Present Illness:   Thomas Thomas is a 67 y.o. male with the above problem list.  He was last seen 11/25/2022.  His electrocardiogram demonstrated a new left bundle branch block.  He did note some occasional left scapular pain which seem to be musculoskeletal in nature.  Although, he did note left scapular pain as an anginal equivalent in the past.  A nuclear stress test and echocardiogram were arranged.  His echocardiogram is abnormal with EF 40-45.  His Myoview demonstrates extensive scar in the anteroseptal region with reduced EF.  I favor proceeding with cardiac catheterization to further evaluate.  I reviewed the findings with Dr. Marlou Thomas who agreed.  He returns for follow-up on CAD and to arrange cardiac catheterization.  He is here alone today. He notes dyspnea on exertion for the past year. He thought he was out of shape. He has a hx of L scapular pain with use of his arm. This has been ongoing since an injury years ago. He also had L scapular pain with his MI. He has noted scapular discomfort if he exerts himself (e.g. walking up a couple flights of steps). He has not had rest symptoms. He has not had syncope, paroxysmal nocturnal dyspnea, edema. He does not smoke. He is not  allergic to contrast.   EKG: NSR, HR 73, Left Bundle Branch Block, QTc 478 ms   Subjective    Reviewed and updated this encounter:   Tobacco  Allergies  Meds  Problems  Med Hx  Surg Hx  Fam Hx     Review of Systems  Constitutional: Negative for chills and fever.  Respiratory:  Positive for cough (non-productive since increase of ACE).   Gastrointestinal:  Negative for hematochezia and melena.  Genitourinary:  Negative for hematuria.    Objective   Labs/Other Test Reviewed:   Recent Labs: No results found for requested labs within last 365 days.   Recent Lipid Panel No results for input(s): "CHOL", "TRIG", "HDL", "VLDL", "LDLCALC", "LDLDIRECT" in the last 8760 hours.   Risk Assessment/Calculations/Metrics:             Physical Exam:   VS:  BP 136/80   Pulse 80   Ht '5\' 7"'$  (1.702 m)   Wt 224 lb (101.6 kg)   SpO2 98%   BMI 35.08 kg/m    Wt Readings from Last 3 Encounters:  12/22/22 224 lb (101.6 kg)  12/17/22 225 lb (102.1 kg)  11/25/22 225 lb 9.6 oz (102.3 kg)    Constitutional:      Appearance: Healthy appearance. Not in distress.  Neck:     Vascular: JVD normal.  Pulmonary:     Effort: Pulmonary effort is normal.     Breath sounds: No wheezing.  No rales.  Cardiovascular:     Normal rate. Regular rhythm. Normal S1. Normal S2.      Murmurs: There is no murmur.  Edema:    Peripheral edema absent.  Abdominal:     Palpations: Abdomen is soft.     Tenderness: There is no abdominal tenderness.  Skin:    General: Skin is warm and dry.     Assessment & Plan    ASSESSMENT & PLAN:   Coronary artery disease involving native coronary artery of native heart with angina pectoris ([place]) Hx of NSTEMI followed by CABG in 2017. He presented at that time with pneumonia and developed pulmonary edema during his cardiac catheterization requiring intubation, diuresis. He was recently noted to have Left Bundle Branch Block. His Myoview shows anteroseptal scar with peri-infarct  ischemia. His EF is down on echocardiogram at 40-45%. As noted, I have reviewed with Dr. Marlou Thomas and we have recommended proceeding with cardiac catheterization.  Continue aspirin 81 mg daily, carvedilol 6.25 mg twice daily, rosuvastatin 20 mg daily Prescription given for nitroglycerin as needed BMET, CBC today Schedule cardiac catheterization Follow-up post catheterization  Primary hypertension Fair control.  Continue carvedilol 6.25 mg twice daily, lisinopril 30 mg daily.  He has noted a nonproductive cough since increasing the dose of lisinopril.  If this continues, we can consider changing to an ARB.  Pure hypercholesterolemia Continue Crestor 20 mg daily.  I have received labs obtained at his employer prior to retiring in 07/2022.  HDL was 41, triglycerides 113.  There was no LDL calculated.  We will have to arrange fasting lipids at some point after his cardiac catheterization.  Heart failure with mildly reduced ejection fraction (HFmrEF) (HCC) EF 40-45. NYHA II. Volume status stable. Continue Lisinopril 30 mg once daily, Coreg 6.25 mg twice daily. Depending on results of cardiac catheterization, we can consider changing Lisinopril to Thomas Thomas or starting on SGLT2 inhibitor.           Shared Decision Making/Informed Consent The risks [stroke (1 in 1000), death (1 in 1000), kidney failure [usually temporary] (1 in 500), bleeding (1 in 200), allergic reaction [possibly serious] (1 in 200)], benefits (diagnostic support and management of coronary artery disease) and alternatives of a cardiac catheterization were discussed in detail with Thomas Thomas and he is willing to proceed.   Dispo:  Return for Post Procedure Follow Up, w/ Thomas Dopp, PA-C.   Signed, Thomas Dopp, PA-C  12/22/2022 10:35 AM    [facility] [address], [place], [place]  [address] Phone: [phone]; Fax: [phone]

## 2022-12-21 NOTE — H&P (View-Only) (Signed)
Cardiology Office Note:    Date:  12/22/2022   ID:  Thomas Thomas, DOB 02-22-1956, MRN [id number]  PCP:  Gwenlyn Perking, FNP  [facility] Providers Cardiologist:  Candee Furbish, MD     Referring MD: Gwenlyn Perking, FNP   Patient Profile: Coronary artery disease NSTEMI S/p CABG in 2017 TTE 12/24/2015: Mild LVH, EF 55-60, inferior and inferolateral HK, GR 2 DD, mild LAE, normal RVSF, RAP 15  Myoview 12/17/2022: Apical to mid anteroseptal and apical infarction with peri-infarction ischemia, EF 42 HFmrEF (heart failure with mildly reduced ejection fraction)  TTE 12/17/2022: Septal HK, EF 40-45, GR 1 DD, normal RVSF, trivial MR, AV sclerosis, RAP 3    Hypertension  Hyperlipidemia  Diabetes mellitus   History of Present Illness:   Thomas Thomas is a 67 y.o. male with the above problem list.  He was last seen 11/25/2022.  His electrocardiogram demonstrated a new left bundle branch block.  He did note some occasional left scapular pain which seem to be musculoskeletal in nature.  Although, he did note left scapular pain as an anginal equivalent in the past.  A nuclear stress test and echocardiogram were arranged.  His echocardiogram is abnormal with EF 40-45.  His Myoview demonstrates extensive scar in the anteroseptal region with reduced EF.  I favor proceeding with cardiac catheterization to further evaluate.  I reviewed the findings with Dr. Marlou Porch who agreed.  He returns for follow-up on CAD and to arrange cardiac catheterization.  He is here alone today. He notes dyspnea on exertion for the past year. He thought he was out of shape. He has a hx of L scapular pain with use of his arm. This has been ongoing since an injury years ago. He also had L scapular pain with his MI. He has noted scapular discomfort if he exerts himself (e.g. walking up a couple flights of steps). He has not had rest symptoms. He has not had syncope, paroxysmal nocturnal dyspnea, edema. He does not smoke. He is not  allergic to contrast.   EKG: NSR, HR 73, Left Bundle Branch Block, QTc 478 ms   Subjective    Reviewed and updated this encounter:   Tobacco  Allergies  Meds  Problems  Med Hx  Surg Hx  Fam Hx     Review of Systems  Constitutional: Negative for chills and fever.  Respiratory:  Positive for cough (non-productive since increase of ACE).   Gastrointestinal:  Negative for hematochezia and melena.  Genitourinary:  Negative for hematuria.    Objective   Labs/Other Test Reviewed:   Recent Labs: No results found for requested labs within last 365 days.   Recent Lipid Panel No results for input(s): "CHOL", "TRIG", "HDL", "VLDL", "LDLCALC", "LDLDIRECT" in the last 8760 hours.   Risk Assessment/Calculations/Metrics:             Physical Exam:   VS:  BP 136/80   Pulse 80   Ht '5\' 7"'$  (1.702 m)   Wt 224 lb (101.6 kg)   SpO2 98%   BMI 35.08 kg/m    Wt Readings from Last 3 Encounters:  12/22/22 224 lb (101.6 kg)  12/17/22 225 lb (102.1 kg)  11/25/22 225 lb 9.6 oz (102.3 kg)    Constitutional:      Appearance: Healthy appearance. Not in distress.  Neck:     Vascular: JVD normal.  Pulmonary:     Effort: Pulmonary effort is normal.     Breath sounds: No wheezing.  No rales.  Cardiovascular:     Normal rate. Regular rhythm. Normal S1. Normal S2.      Murmurs: There is no murmur.  Edema:    Peripheral edema absent.  Abdominal:     Palpations: Abdomen is soft.     Tenderness: There is no abdominal tenderness.  Skin:    General: Skin is warm and dry.     Assessment & Plan    ASSESSMENT & PLAN:   Coronary artery disease involving native coronary artery of native heart with angina pectoris ([place]) Hx of NSTEMI followed by CABG in 2017. He presented at that time with pneumonia and developed pulmonary edema during his cardiac catheterization requiring intubation, diuresis. He was recently noted to have Left Bundle Branch Block. His Myoview shows anteroseptal scar with peri-infarct  ischemia. His EF is down on echocardiogram at 40-45%. As noted, I have reviewed with Dr. Marlou Porch and we have recommended proceeding with cardiac catheterization.  Continue aspirin 81 mg daily, carvedilol 6.25 mg twice daily, rosuvastatin 20 mg daily Prescription given for nitroglycerin as needed BMET, CBC today Schedule cardiac catheterization Follow-up post catheterization  Primary hypertension Fair control.  Continue carvedilol 6.25 mg twice daily, lisinopril 30 mg daily.  He has noted a nonproductive cough since increasing the dose of lisinopril.  If this continues, we can consider changing to an ARB.  Pure hypercholesterolemia Continue Crestor 20 mg daily.  I have received labs obtained at his employer prior to retiring in 07/2022.  HDL was 41, triglycerides 113.  There was no LDL calculated.  We will have to arrange fasting lipids at some point after his cardiac catheterization.  Heart failure with mildly reduced ejection fraction (HFmrEF) (HCC) EF 40-45. NYHA II. Volume status stable. Continue Lisinopril 30 mg once daily, Coreg 6.25 mg twice daily. Depending on results of cardiac catheterization, we can consider changing Lisinopril to [facility] or starting on SGLT2 inhibitor.           Shared Decision Making/Informed Consent The risks [stroke (1 in 1000), death (1 in 1000), kidney failure [usually temporary] (1 in 500), bleeding (1 in 200), allergic reaction [possibly serious] (1 in 200)], benefits (diagnostic support and management of coronary artery disease) and alternatives of a cardiac catheterization were discussed in detail with Mr. Senske and he is willing to proceed.   Dispo:  Return for Post Procedure Follow Up, w/ [name], PA-C.   Signed, [name], PA-C  12/22/2022 10:35 AM    [facility] [address], [place],   [address] Phone: [phone]; Fax: [phone]

## 2022-12-21 NOTE — Telephone Encounter (Signed)
Returned call to pt.  He wanted to make sure his appointment tomorrow was only for discussion of Heart Cath, and he was made aware that it was.  He thanked me for all the help he has received today.

## 2022-12-21 NOTE — Telephone Encounter (Signed)
Pt requesting to speak with you again about cath

## 2022-12-22 ENCOUNTER — Encounter: Payer: Self-pay | Admitting: Physician Assistant

## 2022-12-22 ENCOUNTER — Ambulatory Visit: Payer: HMO | Attending: Physician Assistant | Admitting: Physician Assistant

## 2022-12-22 VITALS — BP 136/80 | HR 80 | Ht 67.0 in | Wt 224.0 lb

## 2022-12-22 DIAGNOSIS — I5022 Chronic systolic (congestive) heart failure: Secondary | ICD-10-CM

## 2022-12-22 DIAGNOSIS — E7800 Pure hypercholesterolemia, unspecified: Secondary | ICD-10-CM

## 2022-12-22 DIAGNOSIS — I10 Essential (primary) hypertension: Secondary | ICD-10-CM | POA: Diagnosis not present

## 2022-12-22 DIAGNOSIS — I25119 Atherosclerotic heart disease of native coronary artery with unspecified angina pectoris: Secondary | ICD-10-CM | POA: Diagnosis not present

## 2022-12-22 LAB — COMPREHENSIVE METABOLIC PANEL
ALT: 23 IU/L (ref 0–44)
AST: 21 IU/L (ref 0–40)
Albumin/Globulin Ratio: 1.9 (ref 1.2–2.2)
Albumin: 4.6 g/dL (ref 3.9–4.9)
Alkaline Phosphatase: 71 IU/L (ref 44–121)
BUN/Creatinine Ratio: 9 — ABNORMAL LOW (ref 10–24)
BUN: 12 mg/dL (ref 8–27)
Bilirubin Total: 0.3 mg/dL (ref 0.0–1.2)
CO2: 25 mmol/L (ref 20–29)
Calcium: 9.7 mg/dL (ref 8.6–10.2)
Chloride: 102 mmol/L (ref 96–106)
Creatinine, Ser: 1.34 mg/dL — ABNORMAL HIGH (ref 0.76–1.27)
Globulin, Total: 2.4 g/dL (ref 1.5–4.5)
Glucose: 126 mg/dL — ABNORMAL HIGH (ref 70–99)
Potassium: 4.4 mmol/L (ref 3.5–5.2)
Sodium: 139 mmol/L (ref 134–144)
Total Protein: 7 g/dL (ref 6.0–8.5)
eGFR: 58 mL/min/{1.73_m2} — ABNORMAL LOW (ref >59)

## 2022-12-22 LAB — CBC
Hematocrit: 44 % (ref 37.5–51.0)
Hemoglobin: 14.4 g/dL (ref 13.0–17.7)
MCH: 29 pg (ref 26.6–33.0)
MCHC: 32.7 g/dL (ref 31.5–35.7)
MCV: 89 fL (ref 79–97)
Platelets: 194 10*3/uL (ref 150–450)
RBC: 4.96 x10E6/uL (ref 4.14–5.80)
RDW: 15.6 % — ABNORMAL HIGH (ref 11.6–15.4)
WBC: 10.6 10*3/uL (ref 3.4–10.8)

## 2022-12-22 MED ORDER — NITROGLYCERIN 0.4 MG SL SUBL: 0.4000 mg | SUBLINGUAL_TABLET | SUBLINGUAL | 3 refills | Status: AC | PRN

## 2022-12-22 NOTE — Patient Instructions (Addendum)
Medication Instructions:  Your physician recommends that you continue on your current medications as directed. Please refer to the Current Medication list given to you today.  *If you need a refill on your cardiac medications before your next appointment, please call your pharmacy*   Lab Work: TODAY:  BMET & CBC  If you have labs (blood work) drawn today and your tests are completely normal, you will receive your results only by: [place] (if you have MyChart) OR A paper copy in the mail If you have any lab test that is abnormal or we need to change your treatment, we will call you to review the results.   Testing/Procedures: Your physician has requested that you have a cardiac catheterization. Cardiac catheterization is used to diagnose and/or treat various heart conditions. Doctors may recommend this procedure for a number of different reasons. The most common reason is to evaluate chest pain. Chest pain can be a symptom of coronary artery disease (CAD), and cardiac catheterization can show whether plaque is narrowing or blocking your heart's arteries. This procedure is also used to evaluate the valves, as well as measure the blood flow and oxygen levels in different parts of your heart. For further information please visit [email or web address]. Please follow instruction sheet, BELOW:        Cardiac/Peripheral Catheterization   You are scheduled for a Cardiac Catheterization on Friday, March 1 with Dr. Larae Grooms.  1. Please arrive at the Main Entrance A at [facility]: [place], [place] [address] on March 1 at 5:30 AM (This time is two hours before your procedure to ensure your preparation). Free valet parking service is available. You will check in at ADMITTING. The support person will be asked to wait in the waiting room.  It is OK to have someone drop you off and come back when you are ready to be discharged.        Special note: Every effort is  made to have your procedure done on time. Please understand that emergencies sometimes delay scheduled procedures.   . 2. Diet: Do not eat solid foods after midnight.  You may have clear liquids until 5 AM the day of the procedure.  3. Labs: You will need to have blood drawn on TODAY  4. Medication instructions in preparation for your procedure:   Contrast Allergy: No   Stop taking, Lisinopril (Zestril or Prinivil) Thursday, February 29,   On the morning of your procedure, take Aspirin 81 mg and any morning medicines NOT listed above.  You may use sips of water.  5. Plan to go home the same day, you will only stay overnight if medically necessary. 6. You MUST have a responsible adult to drive you home. 7. An adult MUST be with you the first 24 hours after you arrive home. 8. Bring a current list of your medications, and the last time and date medication taken. 9. Bring ID and current insurance cards. 10.Please wear clothes that are easy to get on and off and wear slip-on shoes.  Thank you for allowing [place] to care for you!   -- [facility]   Follow-Up: At [facility], you and your health needs are our priority.  As part of our continuing mission to provide you with exceptional heart care, we have created designated Provider Care Teams.  These Care Teams include your primary Cardiologist (physician) and Advanced Practice Providers (APPs -  Physician Assistants and Nurse Practitioners) who  all work together to provide you with the care you need, when you need it.  We recommend signing up for the patient portal called "MyChart".  Sign up information is provided on this After Visit Summary.  MyChart is used to connect with patients for Virtual Visits (Telemedicine).  Patients are able to view lab/test results, encounter notes, upcoming appointments, etc.  Non-urgent messages can be sent to your provider as well.   To learn more about what you can  do with MyChart, go to [email or web address].    Your next appointment:   As scheduled    Provider:   [name], PA-C         Other Instructions

## 2022-12-22 NOTE — Assessment & Plan Note (Addendum)
Continue Crestor 20 mg daily.  I have received labs obtained at his employer prior to retiring in 07/2022.  HDL was 41, triglycerides 113.  There was no LDL calculated.  We will have to arrange fasting lipids at some point after his cardiac catheterization.

## 2022-12-22 NOTE — Assessment & Plan Note (Signed)
Fair control.  Continue carvedilol 6.25 mg twice daily, lisinopril 30 mg daily.  He has noted a nonproductive cough since increasing the dose of lisinopril.  If this continues, we can consider changing to an ARB.

## 2022-12-22 NOTE — Assessment & Plan Note (Signed)
Hx of NSTEMI followed by CABG in 2017. He presented at that time with pneumonia and developed pulmonary edema during his cardiac catheterization requiring intubation, diuresis. He was recently noted to have Left Bundle Branch Block. His Myoview shows anteroseptal scar with peri-infarct ischemia. His EF is down on echocardiogram at 40-45%. As noted, I have reviewed with Dr. Marlou Porch and we have recommended proceeding with cardiac catheterization.  Continue aspirin 81 mg daily, carvedilol 6.25 mg twice daily, rosuvastatin 20 mg daily Prescription given for nitroglycerin as needed BMET, CBC today Schedule cardiac catheterization Follow-up post catheterization

## 2022-12-22 NOTE — Assessment & Plan Note (Signed)
EF 40-45. NYHA II. Volume status stable. Continue Lisinopril 30 mg once daily, Coreg 6.25 mg twice daily. Depending on results of cardiac catheterization, we can consider changing Lisinopril to [facility] or starting on SGLT2 inhibitor.

## 2022-12-24 ENCOUNTER — Telehealth: Payer: Self-pay | Admitting: *Deleted

## 2022-12-24 NOTE — Telephone Encounter (Signed)
Cardiac Catheterization scheduled at [facility] for: Friday December 25, 2022 7:30 AM Arrival time and place: [place] Entrance A at: 5:30 AM  Nothing to eat after midnight prior to procedure, clear liquids until 5 AM day of procedure.  Medication instructions: -Hold:  Lisinopril-day before and day of procedure-per protocol GFR 58 -Other usual morning medications can be taken with sips of water including aspirin 81 mg.  Confirmed patient has responsible adult to drive home post procedure and be with patient first 24 hours after arriving home.  Patient reports no new symptoms concerning for COVID-19 in the past 10 days.  Reviewed procedure instructions with patient.

## 2022-12-25 ENCOUNTER — Encounter (HOSPITAL_COMMUNITY)
Admission: RE | Disposition: A | Discharge status: Home / Self Care | Payer: Self-pay | Source: Home / Self Care | Attending: Interventional Cardiology

## 2022-12-25 ENCOUNTER — Ambulatory Visit (HOSPITAL_COMMUNITY)
Admission: RE | Admit: 2022-12-25 | Discharge: 2022-12-25 | Disposition: A | Discharge status: Home / Self Care | Payer: HMO | Attending: Interventional Cardiology | Admitting: Interventional Cardiology

## 2022-12-25 ENCOUNTER — Encounter (HOSPITAL_COMMUNITY): Payer: Self-pay | Admitting: Interventional Cardiology

## 2022-12-25 DIAGNOSIS — I110 Hypertensive heart disease with heart failure: Secondary | ICD-10-CM | POA: Insufficient documentation

## 2022-12-25 DIAGNOSIS — I2582 Chronic total occlusion of coronary artery: Secondary | ICD-10-CM | POA: Insufficient documentation

## 2022-12-25 DIAGNOSIS — Z79899 Other long term (current) drug therapy: Secondary | ICD-10-CM | POA: Diagnosis not present

## 2022-12-25 DIAGNOSIS — I252 Old myocardial infarction: Secondary | ICD-10-CM | POA: Diagnosis not present

## 2022-12-25 DIAGNOSIS — I25118 Atherosclerotic heart disease of native coronary artery with other forms of angina pectoris: Secondary | ICD-10-CM | POA: Insufficient documentation

## 2022-12-25 DIAGNOSIS — Z951 Presence of aortocoronary bypass graft: Secondary | ICD-10-CM | POA: Diagnosis not present

## 2022-12-25 DIAGNOSIS — Z7982 Long term (current) use of aspirin: Secondary | ICD-10-CM | POA: Diagnosis not present

## 2022-12-25 DIAGNOSIS — I5022 Chronic systolic (congestive) heart failure: Secondary | ICD-10-CM | POA: Insufficient documentation

## 2022-12-25 DIAGNOSIS — E7800 Pure hypercholesterolemia, unspecified: Secondary | ICD-10-CM | POA: Diagnosis not present

## 2022-12-25 DIAGNOSIS — I25119 Atherosclerotic heart disease of native coronary artery with unspecified angina pectoris: Secondary | ICD-10-CM

## 2022-12-25 DIAGNOSIS — E119 Type 2 diabetes mellitus without complications: Secondary | ICD-10-CM | POA: Insufficient documentation

## 2022-12-25 HISTORY — PX: LEFT HEART CATH AND CORS/GRAFTS ANGIOGRAPHY: CATH118250

## 2022-12-25 SURGERY — LEFT HEART CATH AND CORS/GRAFTS ANGIOGRAPHY
Anesthesia: LOCAL

## 2022-12-25 MED ORDER — LIDOCAINE HCL (PF) 1 % IJ SOLN
INTRAMUSCULAR | Status: AC
  Filled 2022-12-25: qty 30

## 2022-12-25 MED ORDER — SODIUM CHLORIDE 0.9 % WEIGHT BASED INFUSION: 1.0000 mL/kg/h | INTRAVENOUS | Status: DC

## 2022-12-25 MED ORDER — ONDANSETRON HCL 4 MG/2ML IJ SOLN: 4.0000 mg | Freq: Four times a day (QID) | INTRAMUSCULAR | Status: DC | PRN

## 2022-12-25 MED ORDER — HEPARIN SODIUM (PORCINE) 1000 UNIT/ML IJ SOLN
INTRAMUSCULAR | Status: AC
  Filled 2022-12-25: qty 10

## 2022-12-25 MED ORDER — HYDRALAZINE HCL 20 MG/ML IJ SOLN: 10.0000 mg | INTRAMUSCULAR | Status: DC | PRN

## 2022-12-25 MED ORDER — ASPIRIN 81 MG PO CHEW: 81.0000 mg | CHEWABLE_TABLET | ORAL | Status: DC

## 2022-12-25 MED ORDER — MIDAZOLAM HCL 2 MG/2ML IJ SOLN
INTRAMUSCULAR | Status: AC
  Filled 2022-12-25: qty 2

## 2022-12-25 MED ORDER — HEPARIN (PORCINE) IN NACL 1000-0.9 UT/500ML-% IV SOLN
INTRAVENOUS | Status: DC | PRN
  Administered 2022-12-25 (×2): 500 mL

## 2022-12-25 MED ORDER — VERAPAMIL HCL 2.5 MG/ML IV SOLN
INTRAVENOUS | Status: DC | PRN
  Administered 2022-12-25: 10 mL via INTRA_ARTERIAL

## 2022-12-25 MED ORDER — VERAPAMIL HCL 2.5 MG/ML IV SOLN
INTRAVENOUS | Status: AC
  Filled 2022-12-25: qty 2

## 2022-12-25 MED ORDER — LIDOCAINE HCL (PF) 1 % IJ SOLN
INTRAMUSCULAR | Status: DC | PRN
  Administered 2022-12-25: 2 mL

## 2022-12-25 MED ORDER — SODIUM CHLORIDE 0.9% FLUSH: 3.0000 mL | INTRAVENOUS | Status: DC | PRN

## 2022-12-25 MED ORDER — ACETAMINOPHEN 325 MG PO TABS: 650.0000 mg | ORAL_TABLET | ORAL | Status: DC | PRN

## 2022-12-25 MED ORDER — MIDAZOLAM HCL 2 MG/2ML IJ SOLN
INTRAMUSCULAR | Status: DC | PRN
  Administered 2022-12-25: 2 mg via INTRAVENOUS
  Administered 2022-12-25: 1 mg via INTRAVENOUS

## 2022-12-25 MED ORDER — HEPARIN SODIUM (PORCINE) 1000 UNIT/ML IJ SOLN
INTRAMUSCULAR | Status: DC | PRN
  Administered 2022-12-25: 5000 [IU] via INTRAVENOUS

## 2022-12-25 MED ORDER — SODIUM CHLORIDE 0.9 % IV SOLN: 250.0000 mL | INTRAVENOUS | Status: DC | PRN

## 2022-12-25 MED ORDER — FENTANYL CITRATE (PF) 100 MCG/2ML IJ SOLN
INTRAMUSCULAR | Status: DC | PRN
  Administered 2022-12-25 (×2): 25 ug via INTRAVENOUS

## 2022-12-25 MED ORDER — SODIUM CHLORIDE 0.9% FLUSH: 3.0000 mL | Freq: Two times a day (BID) | INTRAVENOUS | Status: DC

## 2022-12-25 MED ORDER — FENTANYL CITRATE (PF) 100 MCG/2ML IJ SOLN
INTRAMUSCULAR | Status: AC
  Filled 2022-12-25: qty 2

## 2022-12-25 MED ORDER — IOHEXOL 350 MG/ML SOLN
INTRAVENOUS | Status: DC | PRN
  Administered 2022-12-25: 110 mL

## 2022-12-25 MED ORDER — SODIUM CHLORIDE 0.9 % WEIGHT BASED INFUSION
3.0000 mL/kg/h | INTRAVENOUS | Status: AC
  Administered 2022-12-25: 3 mL/kg/h via INTRAVENOUS

## 2022-12-25 MED ORDER — LABETALOL HCL 5 MG/ML IV SOLN: 10.0000 mg | INTRAVENOUS | Status: DC | PRN

## 2022-12-25 MED ORDER — SODIUM CHLORIDE 0.9 % IV SOLN: INTRAVENOUS | Status: AC

## 2022-12-25 SURGICAL SUPPLY — 15 items
BAND CMPR LRG ZPHR (HEMOSTASIS) ×1
BAND ZEPHYR COMPRESS 30 LONG (HEMOSTASIS) IMPLANT
CATH EXPO 5F MPA-1 (CATHETERS) IMPLANT
CATH INFINITI 5 FR AR1 MOD (CATHETERS) IMPLANT
CATH INFINITI 5FR AL1 (CATHETERS) IMPLANT
CATH INFINITI 5FR MULTPACK ANG (CATHETERS) IMPLANT
ELECT DEFIB PAD ADLT CADENCE (PAD) IMPLANT
GLIDESHEATH SLEND SS 6F .021 (SHEATH) IMPLANT
GUIDEWIRE INQWIRE 1.5J.035X260 (WIRE) IMPLANT
INQWIRE 1.5J .035X260CM (WIRE) ×1
KIT HEART LEFT (KITS) ×1 IMPLANT
PACK CARDIAC CATHETERIZATION (CUSTOM PROCEDURE TRAY) ×1 IMPLANT
SHEATH PROBE COVER 6X72 (BAG) IMPLANT
TRANSDUCER W/STOPCOCK (MISCELLANEOUS) ×1 IMPLANT
TUBING CIL FLEX 10 FLL-RA (TUBING) ×1 IMPLANT

## 2022-12-25 NOTE — Interval H&P Note (Signed)
Cath Lab Visit (complete for each Cath Lab visit)  Clinical Evaluation Leading to the Procedure:   ACS: No.  Non-ACS:    Anginal Classification: CCS III  Anti-ischemic medical therapy: Minimal Therapy (1 class of medications)  Non-Invasive Test Results: Intermediate-risk stress test findings: cardiac mortality 1-3%/year  Prior CABG: prior CABG      History and Physical Interval Note:  12/25/2022 7:45 AM  Thomas Thomas  has presented today for surgery, with the diagnosis of chest pain.  The various methods of treatment have been discussed with the patient and family. After consideration of risks, benefits and other options for treatment, the patient has consented to  Procedure(s): LEFT HEART CATH AND CORONARY ANGIOGRAPHY (N/A) as a surgical intervention.  The patient's history has been reviewed, patient examined, no change in status, stable for surgery.  I have reviewed the patient's chart and labs.  Questions were answered to the patient's satisfaction.     Larae Grooms

## 2023-01-07 NOTE — Progress Notes (Signed)
Cardiology Office Note:    Date:  01/08/2023  ID:  Thomas Thomas, DOB 1956-02-18, MRN [id number] [place] Providers Cardiologist:  Candee Furbish, MD      Patient Profile:   Coronary artery disease NSTEMI S/p CABG in 2017 TTE 12/24/2015: Mild LVH, EF 55-60, inferior and inferolateral HK, GR 2 DD, mild LAE, normal RVSF, RAP 15  Myoview 12/17/2022: Apical to mid anteroseptal and apical infarction with peri-infarction ischemia, EF 42 LHC 12/25/2022: LAD proximal 80, D2 75; LCx mid 100; RCA mid 100 with left-to-right collaterals; SVG-PDA 100, SVG-OM patent, LIMA-LAD patent; no marker for SVG-Dx>> med Rx (consider CTO PCI of RCA if refractory angina) HFmrEF (heart failure with mildly reduced ejection fraction)  TTE 12/17/2022: Septal HK, EF 40-45, GR 1 DD, normal RVSF, trivial MR, AV sclerosis, RAP 3    Hypertension  Hyperlipidemia  Diabetes mellitus     History of Present Illness:   Thomas Thomas is a 67 y.o. male who returns for follow-up after recent cardiac catheterization.  He was seen in January and noted to have a new left bundle branch block as well as occasional left scapular pain which was similar to his anginal equivalent in the past.  His EF was down on echocardiogram at 40-45.  Nuclear stress test demonstrated extensive scar in the anteroseptal region.  Cardiac catheterization demonstrated an occluded mid RCA and an occluded SVG-PDA.  There were left-to-right collaterals.  SVG-OM and LIMA-LAD were patent.  There was 75% stenosis in the D2 which is a small vessel.  There was no proximal marker for the SVG-Dx.  There were no obvious targets for PCI.  Medical therapy was recommended.  It was felt that CTO PCI of the RCA could be considered if he has refractory angina.He is here alone today. He started a walking program since his cath (2 miles in the morning and 1 mile in the evening). He has not had any further L scapular pain. He notes improved shortness of breath. He has not had  orthopnea, edema, syncope.   ROS See HPI    Studies Reviewed:    EKG: Not done  Risk Assessment/Calculations:     HYPERTENSION CONTROL Vitals:   01/08/23 0828 01/08/23 0932  BP: (!) 152/70 (!) 146/90    The patient's blood pressure is elevated above target today.  In order to address the patient's elevated BP: A new medication was prescribed today.          Physical Exam:   VS:  BP (!) 146/90   Pulse 70   Ht 5\' 7"  (1.702 m)   Wt 224 lb 12.8 oz (102 kg)   SpO2 94%   BMI 35.21 kg/m    Wt Readings from Last 3 Encounters:  01/08/23 224 lb 12.8 oz (102 kg)  12/25/22 223 lb (101.2 kg)  12/22/22 224 lb (101.6 kg)    Constitutional:      Appearance: Healthy appearance. Not in distress.  Neck:     Vascular: JVD normal.  Pulmonary:     Breath sounds: Normal breath sounds. No wheezing. No rales.  Cardiovascular:     Normal rate. Regular rhythm. Normal S1. Normal S2.      Murmurs: There is no murmur.     Comments: L wrist w/o hematoma Edema:    Peripheral edema absent.  Abdominal:     Palpations: Abdomen is soft.       ASSESSMENT AND PLAN:   Coronary artery disease involving native coronary artery of native heart  with angina pectoris [facility]) Recent evaluation for Left Bundle Branch Block with echocardiogram demonstrated reduced EF at 40-45.  Myoview demonstrated anteroseptal and apical infarction with peri-infarction ischemia.  Cardiac catheterization demonstrated an occluded native RCA as well as an occluded SVG-PDA.  The LIMA-LAD and SVG-OM were patent.  There was no marker for the SVG-Dx.  There was moderate disease in a small D2.  There were no targets for PCI and medical therapy has been recommended.  CTO PCI of the RCA could be considered if he has refractory angina.  Since his cardiac catheterization, he has not had further left scapular pain.  He has been walking on a regular basis and is feeling much better.  Continue aspirin 81 mg daily, carvedilol 6.25 mg twice  daily, Crestor 20 mg daily.  If he has recurrent symptoms, consider the addition of amlodipine versus ranolazine.  He is on PDE-5 inhibitors and therefore cannot take nitrates.  Heart failure with mildly reduced ejection fraction (HFmrEF) (HCC) EF 40-45 by echocardiogram in February 2024.  NYHA II.  Volume status stable.  We discussed the importance of GDMT for management of CHF.  Blood pressure is somewhat above target.  He has had more normal blood pressures at home.  I have recommended that we switch his lisinopril to [facility].  We can always consider the addition of an SGLT2 inhibitor +/- MRA in the future. DC lisinopril After 36 hours, start Entresto 24/26 mg twice daily Continue carvedilol 6.25 mg twice daily BMET 1 month Follow-up 3 months  Primary hypertension Blood sugar elevated today.  He notes better blood pressures at home.  Change lisinopril to [facility] as noted.  This should help improve his blood pressure.  Continue carvedilol 6.25 mg twice daily.  Pure hypercholesterolemia Continue Crestor 20 mg daily.  Obtain CMET, lipids today.        Dispo:  Return in about 3 months (around 04/10/2023) for Routine Follow Up w/ Dr. Marlou Porch, or [name], PA-C.  Signed, [name], PA-C

## 2023-01-08 ENCOUNTER — Ambulatory Visit: Payer: HMO | Attending: Physician Assistant | Admitting: Physician Assistant

## 2023-01-08 ENCOUNTER — Encounter: Payer: Self-pay | Admitting: Physician Assistant

## 2023-01-08 VITALS — BP 146/90 | HR 70 | Ht 67.0 in | Wt 224.8 lb

## 2023-01-08 DIAGNOSIS — I5022 Chronic systolic (congestive) heart failure: Secondary | ICD-10-CM | POA: Diagnosis not present

## 2023-01-08 DIAGNOSIS — I10 Essential (primary) hypertension: Secondary | ICD-10-CM | POA: Diagnosis not present

## 2023-01-08 DIAGNOSIS — E7800 Pure hypercholesterolemia, unspecified: Secondary | ICD-10-CM | POA: Diagnosis not present

## 2023-01-08 DIAGNOSIS — I25119 Atherosclerotic heart disease of native coronary artery with unspecified angina pectoris: Secondary | ICD-10-CM | POA: Diagnosis not present

## 2023-01-08 MED ORDER — ENTRESTO 24-26 MG PO TABS: 1.0000 | ORAL_TABLET | Freq: Two times a day (BID) | ORAL | 1 refills | Status: DC

## 2023-01-08 NOTE — Assessment & Plan Note (Signed)
Recent evaluation for Left Bundle Branch Block with echocardiogram demonstrated reduced EF at 40-45.  Myoview demonstrated anteroseptal and apical infarction with peri-infarction ischemia.  Cardiac catheterization demonstrated an occluded native RCA as well as an occluded SVG-PDA.  The LIMA-LAD and SVG-OM were patent.  There was no marker for the SVG-Dx.  There was moderate disease in a small D2.  There were no targets for PCI and medical therapy has been recommended.  CTO PCI of the RCA could be considered if he has refractory angina.  Since his cardiac catheterization, he has not had further left scapular pain.  He has been walking on a regular basis and is feeling much better.  Continue aspirin 81 mg daily, carvedilol 6.25 mg twice daily, Crestor 20 mg daily.  If he has recurrent symptoms, consider the addition of amlodipine versus ranolazine.  He is on PDE-5 inhibitors and therefore cannot take nitrates.

## 2023-01-08 NOTE — Assessment & Plan Note (Signed)
Continue Crestor 20 mg daily.  Obtain CMET, lipids today.

## 2023-01-08 NOTE — Patient Instructions (Signed)
Medication Instructions:  Your physician has recommended you make the following change in your medication:   STOP Lisinopril  START Entresto 24-26 taking 1 twice a day  START SUNDAY MORNING, 01/10/23   *If you need a refill on your cardiac medications before your next appointment, please call your pharmacy*   Lab Work: TODAY: CMET & LIPID  1 MONTH:  BMET  If you have labs (blood work) drawn today and your tests are completely normal, you will receive your results only by: [place] (if you have MyChart) OR A paper copy in the mail If you have any lab test that is abnormal or we need to change your treatment, we will call you to review the results.   Testing/Procedures: None ordered   Follow-Up: At [facility], you and your health needs are our priority.  As part of our continuing mission to provide you with exceptional heart care, we have created designated Provider Care Teams.  These Care Teams include your primary Cardiologist (physician) and Advanced Practice Providers (APPs -  Physician Assistants and Nurse Practitioners) who all work together to provide you with the care you need, when you need it.  We recommend signing up for the patient portal called "MyChart".  Sign up information is provided on this After Visit Summary.  MyChart is used to connect with patients for Virtual Visits (Telemedicine).  Patients are able to view lab/test results, encounter notes, upcoming appointments, etc.  Non-urgent messages can be sent to your provider as well.   To learn more about what you can do with MyChart, go to [email or web address].    Your next appointment:   3 month(s)  Provider:   Candee Furbish, MD  or [name], PA-C         Other Instructions

## 2023-01-08 NOTE — Assessment & Plan Note (Signed)
EF 40-45 by echocardiogram in February 2024.  NYHA II.  Volume status stable.  We discussed the importance of GDMT for management of CHF.  Blood pressure is somewhat above target.  He has had more normal blood pressures at home.  I have recommended that we switch his lisinopril to [facility].  We can always consider the addition of an SGLT2 inhibitor +/- MRA in the future. DC lisinopril After 36 hours, start Entresto 24/26 mg twice daily Continue carvedilol 6.25 mg twice daily BMET 1 month Follow-up 3 months

## 2023-01-08 NOTE — Assessment & Plan Note (Signed)
Blood sugar elevated today.  He notes better blood pressures at home.  Change lisinopril to [facility] as noted.  This should help improve his blood pressure.  Continue carvedilol 6.25 mg twice daily.

## 2023-01-09 LAB — COMPREHENSIVE METABOLIC PANEL
ALT: 22 IU/L (ref 0–44)
AST: 20 IU/L (ref 0–40)
Albumin/Globulin Ratio: 1.7 (ref 1.2–2.2)
Albumin: 4.3 g/dL (ref 3.9–4.9)
Alkaline Phosphatase: 73 IU/L (ref 44–121)
BUN/Creatinine Ratio: 11 (ref 10–24)
BUN: 14 mg/dL (ref 8–27)
Bilirubin Total: 0.3 mg/dL (ref 0.0–1.2)
CO2: 23 mmol/L (ref 20–29)
Calcium: 9 mg/dL (ref 8.6–10.2)
Chloride: 102 mmol/L (ref 96–106)
Creatinine, Ser: 1.25 mg/dL (ref 0.76–1.27)
Globulin, Total: 2.5 g/dL (ref 1.5–4.5)
Glucose: 139 mg/dL — ABNORMAL HIGH (ref 70–99)
Potassium: 4.2 mmol/L (ref 3.5–5.2)
Sodium: 140 mmol/L (ref 134–144)
Total Protein: 6.8 g/dL (ref 6.0–8.5)
eGFR: 63 mL/min/{1.73_m2} (ref >59)

## 2023-01-09 LAB — LIPID PANEL
Chol/HDL Ratio: 2.4 ratio (ref 0.0–5.0)
Cholesterol, Total: 106 mg/dL (ref 100–199)
HDL: 45 mg/dL (ref >39)
LDL Chol Calc (NIH): 43 mg/dL (ref 0–99)
Triglycerides: 96 mg/dL (ref 0–149)
VLDL Cholesterol Cal: 18 mg/dL (ref 5–40)

## 2023-02-03 ENCOUNTER — Telehealth: Payer: Self-pay | Admitting: Family Medicine

## 2023-02-03 DIAGNOSIS — G8929 Other chronic pain: Secondary | ICD-10-CM

## 2023-02-03 DIAGNOSIS — M62838 Other muscle spasm: Secondary | ICD-10-CM

## 2023-02-03 NOTE — Telephone Encounter (Signed)
REFERRAL REQUEST Telephone Note  Have you been seen at our office for this problem? YES (Advise that they may need an appointment with their PCP before a referral can be done)  Reason for Referral: Left shoulder under the shoulder blade. Having spasms. Referral discussed with patient: YES  Best contact number of patient for referral team: [phone]    Has patient been seen by a specialist for this issue before: NO  Patient provider preference for referral: [facility] Patient location preference for referral: [place]   Patient notified that referrals can take up to a week or longer to process. If they haven't heard anything within a week they should call back and speak with the referral department.

## 2023-02-03 NOTE — Telephone Encounter (Signed)
Pt hasn't been seen since 11/02/22-ok for referral or does pt ntbs?

## 2023-02-04 NOTE — Telephone Encounter (Signed)
I've placed a referral

## 2023-02-04 NOTE — Telephone Encounter (Signed)
Pt aware referral placed 

## 2023-02-12 ENCOUNTER — Ambulatory Visit: Payer: HMO

## 2023-02-17 ENCOUNTER — Ambulatory Visit: Payer: HMO

## 2023-02-24 ENCOUNTER — Ambulatory Visit: Payer: HMO | Attending: Physician Assistant

## 2023-02-24 DIAGNOSIS — I2510 Atherosclerotic heart disease of native coronary artery without angina pectoris: Secondary | ICD-10-CM | POA: Diagnosis not present

## 2023-02-24 DIAGNOSIS — I447 Left bundle-branch block, unspecified: Secondary | ICD-10-CM | POA: Diagnosis not present

## 2023-02-24 LAB — BASIC METABOLIC PANEL
BUN/Creatinine Ratio: 9 — ABNORMAL LOW (ref 10–24)
BUN: 12 mg/dL (ref 8–27)
CO2: 22 mmol/L (ref 20–29)
Calcium: 9.2 mg/dL (ref 8.6–10.2)
Chloride: 101 mmol/L (ref 96–106)
Creatinine, Ser: 1.34 mg/dL — ABNORMAL HIGH (ref 0.76–1.27)
Glucose: 148 mg/dL — ABNORMAL HIGH (ref 70–99)
Potassium: 4.2 mmol/L (ref 3.5–5.2)
Sodium: 138 mmol/L (ref 134–144)
eGFR: 58 mL/min/{1.73_m2} — ABNORMAL LOW (ref >59)

## 2023-02-25 DIAGNOSIS — M7542 Impingement syndrome of left shoulder: Secondary | ICD-10-CM | POA: Diagnosis not present

## 2023-02-25 DIAGNOSIS — M47812 Spondylosis without myelopathy or radiculopathy, cervical region: Secondary | ICD-10-CM | POA: Diagnosis not present

## 2023-02-27 ENCOUNTER — Other Ambulatory Visit: Payer: Self-pay | Admitting: Cardiology

## 2023-04-08 ENCOUNTER — Ambulatory Visit: Payer: Self-pay | Admitting: Family Medicine

## 2023-04-08 NOTE — Progress Notes (Signed)
Cardiology Office Note:    Date:  04/09/2023  ID:  Thomas Thomas, DOB [birth year], MRN [id number] PCP: Gabriel Earing, FNP  [facility] Providers Cardiologist:  Donato Schultz, MD       Patient Profile:      Coronary artery disease NSTEMI S/p CABG in 2017 TTE 12/24/2015: Mild LVH, EF 55-60, inferior and inferolateral HK, GR 2 DD, mild LAE, normal RVSF, RAP 15  Myoview 12/17/2022: Apical to mid anteroseptal and apical infarction with peri-infarction ischemia, EF 42 LHC 12/25/2022: LAD proximal 80, D2 75; LCx mid 100; RCA mid 100 with left-to-right collaterals; SVG-PDA 100, SVG-OM patent, LIMA-LAD patent; no marker for SVG-Dx>> med Rx (consider CTO PCI of RCA if refractory angina) HFmrEF (heart failure with mildly reduced ejection fraction)  TTE 12/17/2022: Septal HK, EF 40-45, GR 1 DD, normal RVSF, trivial MR, AV sclerosis, RAP 3    Hypertension  Hyperlipidemia  Diabetes mellitus       History of Present Illness:   Thomas Thomas is a 67 y.o. male who returns for f/u of HFmrEF (heart failure with mildly reduced ejection fraction), CAD. He was last seen in 12/2022. His ACE was switched to ARNI at that time. He is here alone. He developed shortness of breath with Entresto. He tried to continue on it but stopped it after a month. The shortness of breath improved off the med. He has been walking every day. He is up to 2 miles. He states that he "feels great!" He has not had chest pain. His L shoulder continues to be painful but he did see ortho that notes he has poor union of a clavicular fx. He was given a steroid injection with improvement. He has not had orthopnea, edema.   ROS See HPI    Studies Reviewed:    EKG:  not done   Risk Assessment/Calculations:     HYPERTENSION CONTROL Vitals:   04/09/23 0833 04/09/23 0911  BP: (!) 160/80 (!) 152/84    The patient's blood pressure is elevated above target today.  In order to address the patient's elevated BP:           Physical  Exam:   VS:  BP (!) 152/84   Pulse 74   Ht 5\' 7"  (1.702 m)   Wt 215 lb 12.8 oz (97.9 kg)   SpO2 97%   BMI 33.80 kg/m    Wt Readings from Last 3 Encounters:  04/09/23 215 lb 12.8 oz (97.9 kg)  01/08/23 224 lb 12.8 oz (102 kg)  12/25/22 223 lb (101.2 kg)    Constitutional:      Appearance: Healthy appearance. Not in distress.  Neck:     Vascular: JVD normal.  Pulmonary:     Breath sounds: Normal breath sounds. No wheezing. No rales.  Cardiovascular:     Normal rate. Regular rhythm.     Murmurs: There is no murmur.  Edema:    Peripheral edema absent.  Abdominal:     Palpations: Abdomen is soft.       ASSESSMENT AND PLAN:   Heart failure with mildly reduced ejection fraction (HFmrEF) (HCC) EF 40-45 by echocardiogram in February 2024.  NYHA II.  Volume status stable.  He could not tolerate Entresto and feels good at this time. We discussed the importance GDMT for CHF. I recommend restarting his ACE. He prefers to not take more medications. We can hold off on starting SGLT2i and MRA for now, unless he starts to have symptoms of shortness of  breath or issues with volume overload.  Continue Coreg 6.25 mg twice daily Restart Lisinopril 30 mg once daily.  BMET 1 month F/u 6 mos.   Coronary artery disease involving native coronary artery of native heart with angina pectoris (HCC) Status post non-STEMI followed by CABG in 2017.  Recent cardiac catheterization in March 2024 with occluded vein graft to the PDA, occluded RCA, patent LIMA-LAD, patent SVG-OM.  There was no marker for SVG-Dx.  He has been managed medically.  Overall, he is not having symptoms to suggest angina.  Continue ASA 81 mg daily, carvedilol 6.25 mg twice daily, Crestor 20 mg daily.  Follow-up 6 months.  Primary hypertension Blood pressure elevated.  Continue carvedilol 6.25 mg twice daily.  Restart lisinopril 30 mg daily.  BMET 1 month.  Pure hypercholesterolemia LDL in March 2024 was optimal at 43.  Continue  Crestor 20 mg daily.      Dispo:  Return in about 6 months (around 10/09/2023) for Routine Follow Up w/ Dr. Anne Fu, or Tereso Newcomer, PA-C.  Signed, Tereso Newcomer, PA-C

## 2023-04-09 ENCOUNTER — Other Ambulatory Visit: Payer: Self-pay | Admitting: *Deleted

## 2023-04-09 ENCOUNTER — Ambulatory Visit: Payer: HMO | Attending: Physician Assistant | Admitting: Physician Assistant

## 2023-04-09 ENCOUNTER — Encounter: Payer: Self-pay | Admitting: Physician Assistant

## 2023-04-09 VITALS — BP 152/84 | HR 74 | Ht 67.0 in | Wt 215.8 lb

## 2023-04-09 DIAGNOSIS — I5022 Chronic systolic (congestive) heart failure: Secondary | ICD-10-CM | POA: Diagnosis not present

## 2023-04-09 DIAGNOSIS — I25119 Atherosclerotic heart disease of native coronary artery with unspecified angina pectoris: Secondary | ICD-10-CM

## 2023-04-09 DIAGNOSIS — E7800 Pure hypercholesterolemia, unspecified: Secondary | ICD-10-CM

## 2023-04-09 DIAGNOSIS — I10 Essential (primary) hypertension: Secondary | ICD-10-CM | POA: Diagnosis not present

## 2023-04-09 MED ORDER — LISINOPRIL 20 MG PO TABS: 30.0000 mg | ORAL_TABLET | Freq: Every day | ORAL | 3 refills | Status: DC

## 2023-04-09 NOTE — Assessment & Plan Note (Signed)
Status post non-STEMI followed by CABG in 2017.  Recent cardiac catheterization in March 2024 with occluded vein graft to the PDA, occluded RCA, patent LIMA-LAD, patent SVG-OM.  There was no marker for SVG-Dx.  He has been managed medically.  Overall, he is not having symptoms to suggest angina.  Continue ASA 81 mg daily, carvedilol 6.25 mg twice daily, Crestor 20 mg daily.  Follow-up 6 months.

## 2023-04-09 NOTE — Patient Instructions (Signed)
Medication Instructions:  Your physician has recommended you make the following change in your medication:   STOP Entresto  START Lisinopril 20 mg taking 1 and 1/2 daily - 30 mg   *If you need a refill on your cardiac medications before your next appointment, please call your pharmacy*   Lab Work: 05/09/24:  GO TO [facility] FOR BMET  If you have labs (blood work) drawn today and your tests are completely normal, you will receive your results only by: MyChart Message (if you have MyChart) OR A paper copy in the mail If you have any lab test that is abnormal or we need to change your treatment, we will call you to review the results.   Testing/Procedures: None orderd   Follow-Up: At [facility], you and your health needs are our priority.  As part of our continuing mission to provide you with exceptional heart care, we have created designated Provider Care Teams.  These Care Teams include your primary Cardiologist (physician) and Advanced Practice Providers (APPs -  Physician Assistants and Nurse Practitioners) who all work together to provide you with the care you need, when you need it.  We recommend signing up for the patient portal called "MyChart".  Sign up information is provided on this After Visit Summary.  MyChart is used to connect with patients for Virtual Visits (Telemedicine).  Patients are able to view lab/test results, encounter notes, upcoming appointments, etc.  Non-urgent messages can be sent to your provider as well.   To learn more about what you can do with MyChart, go to [email or web address].    Your next appointment:   6 month(s)  Provider:   Donato Schultz, MD  or Tereso Newcomer, PA-C         Other Instructions

## 2023-04-09 NOTE — Assessment & Plan Note (Signed)
EF 40-45 by echocardiogram in February 2024.  NYHA II.  Volume status stable.  He could not tolerate Entresto and feels good at this time. We discussed the importance GDMT for CHF. I recommend restarting his ACE. He prefers to not take more medications. We can hold off on starting SGLT2i and MRA for now, unless he starts to have symptoms of shortness of breath or issues with volume overload.  Continue Coreg 6.25 mg twice daily Restart Lisinopril 30 mg once daily.  BMET 1 month F/u 6 mos.

## 2023-04-09 NOTE — Assessment & Plan Note (Signed)
LDL in March 2024 was optimal at 43.  Continue Crestor 20 mg daily.

## 2023-04-09 NOTE — Assessment & Plan Note (Signed)
Blood pressure elevated.  Continue carvedilol 6.25 mg twice daily.  Restart lisinopril 30 mg daily.  BMET 1 month.

## 2023-05-10 ENCOUNTER — Other Ambulatory Visit: Payer: Self-pay

## 2023-07-28 ENCOUNTER — Encounter: Payer: Self-pay | Admitting: Family Medicine

## 2023-09-30 ENCOUNTER — Ambulatory Visit: Payer: HMO | Attending: Cardiology | Admitting: Cardiology

## 2023-09-30 ENCOUNTER — Encounter: Payer: Self-pay | Admitting: Cardiology

## 2023-09-30 VITALS — BP 140/86 | HR 77 | Ht 67.0 in | Wt 218.4 lb

## 2023-09-30 DIAGNOSIS — I714 Abdominal aortic aneurysm, without rupture, unspecified: Secondary | ICD-10-CM

## 2023-09-30 DIAGNOSIS — I447 Left bundle-branch block, unspecified: Secondary | ICD-10-CM | POA: Diagnosis not present

## 2023-09-30 DIAGNOSIS — I25119 Atherosclerotic heart disease of native coronary artery with unspecified angina pectoris: Secondary | ICD-10-CM

## 2023-09-30 NOTE — Progress Notes (Signed)
Cardiology Office Note:  .   Date:  09/30/2023  ID:  Thomas Thomas, DOB [birth year], MRN [id number] PCP: Gabriel Earing, FNP  [facility] Providers Cardiologist:  Donato Schultz, MD     History of Present Illness: .   Thomas Thomas is a 67 y.o. male Discussed with the use of AI scribe   History of Present Illness   The patient, a 67 year old with a history of coronary artery disease, underwent cardiac catheterization in March 2024. The procedure revealed a patent LIMA to LAD, a patent SVG to obtuse marginal, and collaterals to the distal RCA. However, the SVG graft to the RCA was occluded, a condition that has persisted since 2017. Despite the lack of clear targets for PCI, the patient was advised to continue with medical therapy. A myocardial perfusion test in February 2024 showed an apical to mid-anteroseptal infarct with peri-infarct ischemia and an EF of 42%. An echocardiogram in February 2024 showed an ejection fraction of 40-45% and trivial mitral regurgitation.  The patient's current medication regimen includes aspirin 81mg , carvedilol 6.25mg  twice daily, lisinopril 30mg  daily, and Crestor 20mg  daily for hyperlipidemia. The patient's LDL was 43 on January 08, 2023, indicating that the hyperlipidemia is well-controlled. The patient's hemoglobin was 14.0, and creatinine was mildly elevated at 1.34.  The patient reported feeling well and has been actively working on building muscle and improving his diet. He reported a significant improvement in his overall well-being after making dietary changes and increasing physical activity. However, the patient experienced a setback due to a broken toe, which has limited his ability to walk for about seven weeks. Despite this, the patient has resumed walking a half mile daily and has purchased a step walker for use during inclement weather.  The patient also reported occasional tingling in the chest but denied experiencing severe chest pain. He  expressed a preference for non-invasive treatment options for his coronary artery disease. The patient also reported pain under the left shoulder blade during strenuous activity, which a chiropractor suggested might be due to an impingement in the neck. The patient is considering chiropractic treatment for this issue.  The patient has been actively working on weight loss and has successfully reduced his weight from 237 to 213 pounds. His goal is to reach 180 pounds by June of the following year. 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 He also volunteered to help feed people affected by a recent natural disaster in [place].          Studies Reviewed: .        Results   LABS LDL: 43 (01/08/2023) Hemoglobin: 14.0 Creatinine: 1.34 Potassium: 4.2  DIAGNOSTIC Cardiac catheterization: Patent LIMA to LAD, patent SVG to obtuse marginal, collaterals to distal RCA, occluded SVG graft to RCA, native RCA occluded since 2017 (12/25/2022) Myocardial perfusion: Apical to mid anteroseptal infarct with peri-infarct ischemia, EF 42% (February 2024) Echocardiogram: EF 40-45%, trivial mitral regurgitation (12/17/2022)     Risk Assessment/Calculations:           Physical Exam:   VS:  BP (!) 140/86   Pulse 77   Ht 5\' 7"  (1.702 m)   Wt 218 lb 6.4 oz (99.1 kg)   SpO2 95%   BMI 34.21 kg/m    Wt Readings from Last 3 Encounters:  09/30/23 218 lb 6.4 oz (99.1 kg)  04/09/23 215 lb 12.8 oz (97.9 kg)  01/08/23 224 lb 12.8 oz (102 kg)  GEN: Well nourished, well developed in no acute distress NECK: No JVD; No carotid bruits CARDIAC: RRR, no murmurs, no rubs, no gallops RESPIRATORY:  Clear to auscultation without rales, wheezing or rhonchi  ABDOMEN: Soft, non-tender, non-distended EXTREMITIES:  No edema; No deformity   ASSESSMENT AND PLAN: .    Assessment and Plan    Coronary Artery Disease (CAD)   Follow-up for CAD. Cardiac  catheterization on 12/25/2022 showed patent LIMA to LAD, patent SVG to obtuse marginal, occluded graft to RCA, and native RCA occluded since 2017. No targets for PCI; continued medical therapy. Prior myocardial perfusion in 11/2022 showed apical to mid anteroseptal infarct with peri-infarct ischemia, EF 42%. Echocardiogram on 12/17/2022 showed EF 40-45%, trivial mitral regurgitation. Currently asymptomatic for refractory angina. Discussed potential for CTO PCI of RCA if refractory angina develops. He is on aspirin 81 mg, carvedilol 6.25 mg BID, lisinopril 30 mg daily, Crestor 20 mg daily. LDL at goal (43 on 01/08/2023). Discussed risks and benefits of CTO PCI of RCA, including potential need for groin access and transient bed rest/immobility post-procedure. He prefers to avoid invasive procedures unless absolutely necessary.   - Continue aspirin 81 mg, carvedilol 6.25 mg BID, lisinopril 30 mg daily, Crestor 20 mg daily   - Monitor for symptoms of refractory angina   - Consider PCI of CTO RCA if refractory angina develops   - Annual follow-up    Left Shoulder Pain   Chronic left shoulder pain exacerbated by stress and physical activity. Previous corticosteroid injection provided temporary relief. Chiropractor diagnosed impingement in the neck causing nerve compression affecting the shoulder. Considering chiropractic manipulation.   - Consider chiropractic manipulation for neck impingement   - Evaluate effectiveness of chiropractic treatment after 3-4 visits   - Consider surgical consultation if symptoms persist    General Health Maintenance   Actively managing weight and diet, currently at 213 lbs with a goal of 180 lbs. Engaged in regular physical activity, recently resumed walking after toe injury. Discussed the importance of continued weight management and physical activity.   - Continue weight management and physical activity   - Monitor and gradually increase walking distance as tolerated   -  Follow-up in one year    Follow-up   - Annual follow-up appointment.               Signed, Donato Schultz, MD

## 2023-09-30 NOTE — Patient Instructions (Signed)
Medication Instructions:  Your physician recommends that you continue on your current medications as directed. Please refer to the Current Medication list given to you today.  *If you need a refill on your cardiac medications before your next appointment, please call your pharmacy*  Lab Work: None ordered today.  Testing/Procedures: Your physician has requested that you have an abdominal aorta duplex. During this test, an ultrasound is used to evaluate the aorta. Allow 30 minutes for this exam. Do not eat after midnight the day before and avoid carbonated beverages.  Please note: We ask at that you not bring children with you during ultrasound (echo/ vascular) testing. Due to room size and safety concerns, children are not allowed in the ultrasound rooms during exams. Our front office staff cannot provide observation of children in our lobby area while testing is being conducted. An adult accompanying a patient to their appointment will only be allowed in the ultrasound room at the discretion of the ultrasound technician under special circumstances. We apologize for any inconvenience.   Follow-Up: At [facility], you and your health needs are our priority.  As part of our continuing mission to provide you with exceptional heart care, we have created designated Provider Care Teams.  These Care Teams include your primary Cardiologist (physician) and Advanced Practice Providers (APPs -  Physician Assistants and Nurse Practitioners) who all work together to provide you with the care you need, when you need it.  Your next appointment:   1 year(s)  The format for your next appointment:   In Person  Provider:   Donato Schultz, MD

## 2023-10-01 ENCOUNTER — Ambulatory Visit (INDEPENDENT_AMBULATORY_CARE_PROVIDER_SITE_OTHER): Payer: HMO

## 2023-10-01 VITALS — Ht 67.0 in | Wt 218.0 lb

## 2023-10-01 DIAGNOSIS — Z0000 Encounter for general adult medical examination without abnormal findings: Secondary | ICD-10-CM

## 2023-10-01 NOTE — Patient Instructions (Signed)
Thomas Thomas , Thank you for taking time to come for your Medicare Wellness Visit. I appreciate your ongoing commitment to your health goals. Please review the following plan we discussed and let me know if I can assist you in the future.   Referrals/Orders/Follow-Ups/Clinician Recommendations: Aim for 30 minutes of exercise or brisk walking, 6-8 glasses of water, and 5 servings of fruits and vegetables each day.  This is a list of the screening recommended for you and due dates:  Health Maintenance  Topic Date Due   DTaP/Tdap/Td vaccine (1 - Tdap) Never done   Colon Cancer Screening  Never done   Zoster (Shingles) Vaccine (1 of 2) 11/26/2004   Pneumonia Vaccine (1 of 1 - PCV) Never done   Flu Shot  05/27/2023   COVID-19 Vaccine (1 - 2023-24 season) Never done   Medicare Annual Wellness Visit  09/30/2024   Hepatitis C Screening  Completed   HPV Vaccine  Aged Out    Advanced directives: (ACP Link)Information on Advanced Care Planning can be found at [facility] Advance Health Care Directives Advance Health Care Directives ([email or web address]   Next Medicare Annual Wellness Visit scheduled for next year: Yes

## 2023-10-01 NOTE — Progress Notes (Signed)
Subjective:   Thomas Thomas is a 67 y.o. male who presents for Medicare Annual/Subsequent preventive examination.  Visit Complete: Virtual I connected with  Thomas Thomas on 10/01/23 by a audio enabled telemedicine application and verified that I am speaking with the correct person using two identifiers.  Patient Location: Home  Provider Location: Home Office  I discussed the limitations of evaluation and management by telemedicine. The patient expressed understanding and agreed to proceed.  Vital Signs: Because this visit was a virtual/telehealth visit, some criteria may be missing or patient reported. Any vitals not documented were not able to be obtained and vitals that have been documented are patient reported.  Patient Medicare AWV questionnaire was completed by the patient on 09/27/23; I have confirmed that all information answered by patient is correct and no changes since this date.  Cardiac Risk Factors include: advanced age (>46men, >70 women);male gender;hypertension;dyslipidemia     Objective:    Today's Vitals   10/01/23 0925  Weight: 218 lb (98.9 kg)  Height: 5\' 7"  (1.702 m)   Body mass index is 34.14 kg/m.     10/01/2023    9:41 AM 12/25/2022    5:35 AM 10/27/2021   11:53 AM 12/28/2015    8:00 PM 12/23/2015    7:08 PM  Advanced Directives  Does Patient Have a Medical Advance Directive? No No No No No  Would patient like information on creating a medical advance directive? Yes (MAU/Ambulatory/Procedural Areas - Information given) Yes (MAU/Ambulatory/Procedural Areas - Information given) No - Patient declined Yes - Educational materials given No - patient declined information    Current Medications (verified) Outpatient Encounter Medications as of 10/01/2023  Medication Sig   aspirin EC 81 MG EC tablet Take 1 tablet (81 mg total) by mouth daily.   carvedilol (COREG) 6.25 MG tablet TAKE 1 TABLET BY MOUTH TWICE DAILY WITH A MEAL . APPOINTMENT REQUIRED FOR FUTURE  REFILLS   lisinopril (ZESTRIL) 30 MG tablet Take 1 tablet by mouth daily.   rosuvastatin (CRESTOR) 20 MG tablet Take 1 tablet (20 mg total) by mouth daily.   nitroGLYCERIN (NITROSTAT) 0.4 MG SL tablet Place 1 tablet (0.4 mg total) under the tongue every 5 (five) minutes as needed for chest pain.   sildenafil (REVATIO) 20 MG tablet Take 2-5 tabs PRN prior to sexual activity (Patient not taking: Reported on 09/30/2023)   No facility-administered encounter medications on file as of 10/01/2023.    Allergies (verified) Entresto [sacubitril-valsartan] and Peanut-containing drug products   History: Past Medical History:  Diagnosis Date   Anxiety    Diabetes mellitus type II, non insulin dependent (HCC) 11/2015   Hypertension    NSTEMI (non-ST elevated myocardial infarction) (HCC) 12/23/2015   Past Surgical History:  Procedure Laterality Date   CARDIAC CATHETERIZATION N/A 12/25/2015   Procedure: Left Heart Cath and Coronary Angiography;  Surgeon: Peter M Swaz[place], MD;  Location: [facility] INVASIVE CV LAB;  Service: Cardiovascular;  Laterality: N/A;   CORONARY ARTERY BYPASS GRAFT N/A 01/02/2016   Procedure: CORONARY ARTERY BYPASS GRAFTING (CABG)X 4 LIMA-LAD; SVG-RCA(ENDARDERECTOMY); SVG-DIAG; SVG-OM ENDOSCOPIC GREATER RIGHT SAPHENOUS VEIN HARVEST;  Surgeon: Kerin Perna, MD; LIMA-LAD, SVG-D, SVG-OM, SVG-RCA-PDA     HERNIA REPAIR     KNEE SURGERY     LEFT HEART CATH AND CORS/GRAFTS ANGIOGRAPHY N/A 12/25/2022   Procedure: LEFT HEART CATH AND CORS/GRAFTS ANGIOGRAPHY;  Surgeon: Corky Crafts, MD;  Location: [facility] INVASIVE CV LAB;  Service: Cardiovascular;  Laterality: N/A;   TEE WITHOUT CARDIOVERSION  N/A 01/02/2016   Procedure: TRANSESOPHAGEAL ECHOCARDIOGRAM (TEE);  Surgeon: Kerin Perna, MD;  Location: [facility];  Service: Open Heart Surgery;  Laterality: N/A;   TONSILLECTOMY     US ECHOCARDIOGRAPHY  12/24/2015   EF 55-60%, grade 1 dd   Family History  Problem Relation Age of Onset   Heart disease  Mother    Cancer Father        stomach   Allergic rhinitis Sister    Asthma Neg Hx    Eczema Neg Hx    Social History   Socioeconomic History   Marital status: Divorced    Spouse name: Not on file   Number of children: Not on file   Years of education: Not on file   Highest education level: Associate degree: academic program  Occupational History   Not on file  Tobacco Use   Smoking status: Former    Current packs/day: 0.00    Types: Cigarettes    Quit date: 06/06/2012    Years since quitting: 11.3   Smokeless tobacco: Never  Vaping Use   Vaping status: Never Used  Substance and Sexual Activity   Alcohol use: No   Drug use: No   Sexual activity: Not on file  Other Topics Concern   Not on file  Social History Narrative   Not on file   Social Determinants of Health   Financial Resource Strain: Low Risk  (09/27/2023)   Overall Financial Resource Strain (CARDIA)    Difficulty of Paying Living Expenses: Not hard at all  Food Insecurity: No Food Insecurity (09/27/2023)   Hunger Vital Sign    Worried About Running Out of Food in the Last Year: Never true    Ran Out of Food in the Last Year: Never true  Transportation Needs: No Transportation Needs (09/27/2023)   PRAPARE - Administrator, Civil Service (Medical): No    Lack of Transportation (Non-Medical): No  Physical Activity: Sufficiently Active (09/27/2023)   Exercise Vital Sign    Days of Exercise per Week: 3 days    Minutes of Exercise per Session: 120 min  Stress: No Stress Concern Present (09/27/2023)   [employer] of Occupational Health - Occupational Stress Questionnaire    Feeling of Stress : Not at all  Social Connections: Unknown (09/27/2023)   Social Connection and Isolation Panel [NHANES]    Frequency of Communication with Friends and Family: More than three times a week    Frequency of Social Gatherings with Friends and Family: More than three times a week    Attends Religious Services:  Patient declined    Database administrator or Organizations: Yes    Attends Banker Meetings: Patient declined    Marital Status: Divorced    Tobacco Counseling Counseling given: Not Answered   Clinical Intake:  Pre-visit preparation completed: Yes  Pain : No/denies pain     Diabetes: No  How often do you need to have someone help you when you read instructions, pamphlets, or other written materials from your doctor or pharmacy?: 1 - Never  Interpreter Needed?: No  Information entered by :: Kandis Fantasia LPN   Activities of Daily Living    09/27/2023   12:48 PM  In your present state of health, do you have any difficulty performing the following activities:  Hearing? 0  Vision? 0  Difficulty concentrating or making decisions? 0  Walking or climbing stairs? 0  Dressing or bathing? 0  Doing errands, shopping?  0  Preparing Food and eating ? N  Using the Toilet? N  In the past six months, have you accidently leaked urine? N  Do you have problems with loss of bowel control? N  Managing your Medications? N  Managing your Finances? N  Housekeeping or managing your Housekeeping? N    Patient Care Team: Gabriel Earing, FNP as PCP - General (Family Medicine) Jake Bathe, MD as PCP - Cardiology (Cardiology)  Indicate any recent Medical Services you may have received from other than [facility] providers in the past year (date may be approximate).     Assessment:   This is a routine wellness examination for [place].  Hearing/Vision screen Hearing Screening - Comments:: Denies hearing difficulties   Vision Screening - Comments:: Wears rx glasses - up to date with routine eye exams with [facility]     Goals Addressed             This Visit's Progress    Remain active and independent        Depression Screen    10/01/2023    9:38 AM 11/02/2022   11:41 AM 10/29/2021    9:41 AM 11/06/2020    3:01 PM 12/20/2018    8:38 AM 06/13/2018    3:34 PM  04/18/2018    9:56 AM  PHQ 2/9 Scores  PHQ - 2 Score 0 0 0 0 0 0 0  PHQ- 9 Score  0 0        Fall Risk    10/01/2023    9:41 AM 09/27/2023   12:48 PM 11/02/2022   11:40 AM 10/29/2021    9:41 AM 11/06/2020    3:00 PM  Fall Risk   Falls in the past year? 0 0 0 0 0  Number falls in past yr: 0      Injury with Fall? 0 0     Risk for fall due to : No Fall Risks      Follow up Falls prevention discussed;Education provided;Falls evaluation completed        MEDICARE RISK AT HOME: Medicare Risk at Home Any stairs in or around the home?: Yes If so, are there any without handrails?: No Home free of loose throw rugs in walkways, pet beds, electrical cords, etc?: Yes Adequate lighting in your home to reduce risk of falls?: Yes Life alert?: No Use of a cane, walker or w/c?: No Grab bars in the bathroom?: Yes Shower chair or bench in shower?: No Elevated toilet seat or a handicapped toilet?: No  TIMED UP AND GO:  Was the test performed?  No    Cognitive Function:        10/01/2023    9:41 AM  6CIT Screen  What Year? 0 points  What month? 0 points  What time? 0 points  Count back from 20 0 points  Months in reverse 0 points  Repeat phrase 0 points  Total Score 0 points    Immunizations Immunization History  Administered Date(s) Administered   Influenza,inj,Quad PF,6+ Mos 08/21/2014   Influenza-Unspecified 08/31/2017, 09/07/2018, 09/06/2020, 08/14/2021   Zoster, Live 07/29/2015    TDAP status: Due, Education has been provided regarding the importance of this vaccine. Advised may receive this vaccine at local pharmacy or Health Dept. Aware to provide a copy of the vaccination record if obtained from local pharmacy or Health Dept. Verbalized acceptance and understanding.  Flu Vaccine status: Due, Education has been provided regarding the importance of this vaccine. Advised  may receive this vaccine at local pharmacy or Health Dept. Aware to provide a copy of the vaccination record  if obtained from local pharmacy or Health Dept. Verbalized acceptance and understanding.  Pneumococcal vaccine status: Declined,  Education has been provided regarding the importance of this vaccine but patient still declined. Advised may receive this vaccine at local pharmacy or Health Dept. Aware to provide a copy of the vaccination record if obtained from local pharmacy or Health Dept. Verbalized acceptance and understanding.   Covid-19 vaccine status: Declined, Education has been provided regarding the importance of this vaccine but patient still declined. Advised may receive this vaccine at local pharmacy or Health Dept.or vaccine clinic. Aware to provide a copy of the vaccination record if obtained from local pharmacy or Health Dept. Verbalized acceptance and understanding.  Qualifies for Shingles Vaccine? Yes   Zostavax completed Yes   Shingrix Completed?: No.    Education has been provided regarding the importance of this vaccine. Patient has been advised to call insurance company to determine out of pocket expense if they have not yet received this vaccine. Advised may also receive vaccine at local pharmacy or Health Dept. Verbalized acceptance and understanding.  Screening Tests Health Maintenance  Topic Date Due   DTaP/Tdap/Td (1 - Tdap) Never done   Colonoscopy  Never done   Zoster Vaccines- Shingrix (1 of 2) 11/26/2004   Pneumonia Vaccine 54+ Years old (1 of 1 - PCV) Never done   INFLUENZA VACCINE  05/27/2023   COVID-19 Vaccine (1 - 2023-24 season) Never done   Medicare Annual Wellness (AWV)  09/30/2024   Hepatitis C Screening  Completed   HPV VACCINES  Aged Out    Health Maintenance  Health Maintenance Due  Topic Date Due   DTaP/Tdap/Td (1 - Tdap) Never done   Colonoscopy  Never done   Zoster Vaccines- Shingrix (1 of 2) 11/26/2004   Pneumonia Vaccine 32+ Years old (1 of 1 - PCV) Never done   INFLUENZA VACCINE  05/27/2023   COVID-19 Vaccine (1 - 2023-24 season) Never  done    Colorectal cancer screening:  Patient declines at this time   Lung Cancer Screening: (Low Dose CT Chest recommended if Age 39-80 years, 20 pack-year currently smoking OR have quit w/in 15years.) does not qualify.   Lung Cancer Screening Referral: n/a  Additional Screening:  Hepatitis C Screening: does qualify; Patient reported  Vision Screening: Recommended annual ophthalmology exams for early detection of glaucoma and other disorders of the eye. Is the patient up to date with their annual eye exam?  Yes  Who is the provider or what is the name of the office in which the patient attends annual eye exams? [facility] If pt is not established with a provider, would they like to be referred to a provider to establish care? No .   Dental Screening: Recommended annual dental exams for proper oral hygiene  Community Resource Referral / Chronic Care Management: CRR required this visit?  No   CCM required this visit?  No     Plan:     I have personally reviewed and noted the following in the patient's chart:   Medical and social history Use of alcohol, tobacco or illicit drugs  Current medications and supplements including opioid prescriptions. Patient is not currently taking opioid prescriptions. Functional ability and status Nutritional status Physical activity Advanced directives List of other physicians Hospitalizations, surgeries, and ER visits in previous 12 months Vitals Screenings to include cognitive, depression, and  falls Referrals and appointments  In addition, I have reviewed and discussed with patient certain preventive protocols, quality metrics, and best practice recommendations. A written personalized care plan for preventive services as well as general preventive health recommendations were provided to patient.     Kandis Fantasia [place], [place]   [id number]   After Visit Summary: (MyChart) Due to this being a telephonic visit, the after visit  summary with patients personalized plan was offered to patient via MyChart   Nurse Notes: No concerns at this time

## 2023-10-15 ENCOUNTER — Other Ambulatory Visit (HOSPITAL_COMMUNITY): Payer: Self-pay | Admitting: *Deleted

## 2023-10-15 ENCOUNTER — Ambulatory Visit (HOSPITAL_COMMUNITY)
Admission: RE | Admit: 2023-10-15 | Discharge: 2023-10-15 | Disposition: A | Discharge status: Home / Self Care | Payer: HMO | Source: Ambulatory Visit | Attending: Cardiovascular Disease | Admitting: Cardiovascular Disease

## 2023-10-15 DIAGNOSIS — I7143 Infrarenal abdominal aortic aneurysm, without rupture: Secondary | ICD-10-CM | POA: Insufficient documentation

## 2023-10-15 DIAGNOSIS — I714 Abdominal aortic aneurysm, without rupture, unspecified: Secondary | ICD-10-CM | POA: Insufficient documentation

## 2023-10-21 ENCOUNTER — Telehealth: Payer: Self-pay | Admitting: Family Medicine

## 2023-10-21 NOTE — Telephone Encounter (Signed)
Copied from CRM [id number]. Topic: Appointments - Appointment Cancel/Reschedule >> Oct 21, 2023 12:01 PM Carloyn Manner C wrote: Patient/patient representative is calling to cancel or reschedule an appointment. Refer to attachments for appointment information.

## 2023-10-22 ENCOUNTER — Ambulatory Visit: Payer: HMO

## 2023-10-25 ENCOUNTER — Ambulatory Visit (INDEPENDENT_AMBULATORY_CARE_PROVIDER_SITE_OTHER): Payer: HMO

## 2023-10-25 DIAGNOSIS — Z23 Encounter for immunization: Secondary | ICD-10-CM | POA: Diagnosis not present

## 2024-02-07 ENCOUNTER — Other Ambulatory Visit: Payer: Self-pay | Admitting: Cardiology

## 2024-04-04 ENCOUNTER — Other Ambulatory Visit: Payer: Self-pay | Admitting: Physician Assistant

## 2024-04-05 MED ORDER — LISINOPRIL 30 MG PO TABS: 30.0000 mg | ORAL_TABLET | Freq: Every day | ORAL | 3 refills | Status: DC

## 2024-05-09 ENCOUNTER — Other Ambulatory Visit: Payer: Self-pay | Admitting: Cardiology

## 2024-06-30 ENCOUNTER — Ambulatory Visit (INDEPENDENT_AMBULATORY_CARE_PROVIDER_SITE_OTHER)

## 2024-06-30 VITALS — Ht 67.0 in | Wt 218.0 lb

## 2024-06-30 DIAGNOSIS — Z0000 Encounter for general adult medical examination without abnormal findings: Secondary | ICD-10-CM

## 2024-06-30 NOTE — Progress Notes (Signed)
 Subjective:   Thomas Thomas is a 68 y.o. who presents for a Medicare Wellness preventive visit.  As a reminder, Annual Wellness Visits don't include a physical exam, and some assessments may be limited, especially if this visit is performed virtually. We may recommend an in-person follow-up visit with your provider if needed.  Visit Complete: Virtual I connected with  Thomas Thomas on 06/30/24 by a audio enabled telemedicine application and verified that I am speaking with the correct person using two identifiers.  Patient Location: Home  Provider Location: Home Office  I discussed the limitations of evaluation and management by telemedicine. The patient expressed understanding and agreed to proceed.  Vital Signs: Because this visit was a virtual/telehealth visit, some criteria may be missing or patient reported. Any vitals not documented were not able to be obtained and vitals that have been documented are patient reported.  VideoDeclined- This patient declined Librarian, academic. Therefore the visit was completed with audio only.  Persons Participating in Visit: Patient.  AWV Questionnaire: Yes: Patient Medicare AWV questionnaire was completed by the patient on 06/27/2024; I have confirmed that all information answered by patient is correct and no changes since this date.  Cardiac Risk Factors include: advanced age (>60men, >67 women);dyslipidemia;hypertension;male gender;Other (see comment), Risk factor comments: history of NSTEMI, CHF     Objective:    Today's Vitals   06/30/24 0809  Weight: 218 lb (98.9 kg)  Height: 5' 7 (1.702 m)   Body mass index is 34.14 kg/m.     06/30/2024    9:37 AM 10/01/2023    9:41 AM 12/25/2022    5:35 AM 10/27/2021   11:53 AM 12/28/2015    8:00 PM 12/23/2015    7:08 PM  Advanced Directives  Does Patient Have a Medical Advance Directive? Yes No No No No  No   Type of Estate agent of [place];Living  will       Does patient want to make changes to medical advance directive? No - Patient declined       Copy of Healthcare Power of Attorney in Chart? Yes - validated most recent copy scanned in chart (See row information)       Would patient like information on creating a medical advance directive?  Yes (MAU/Ambulatory/Procedural Areas - Information given) Yes (MAU/Ambulatory/Procedural Areas - Information given) No - Patient declined Yes - Educational materials given  No - patient declined information      Data saved with a previous flowsheet row definition    Current Medications (verified) Outpatient Encounter Medications as of 06/30/2024  Medication Sig   aspirin  EC 81 MG EC tablet Take 1 tablet (81 mg total) by mouth daily.   nitroGLYCERIN  (NITROSTAT ) 0.4 MG SL tablet Place 1 tablet (0.4 mg total) under the tongue every 5 (five) minutes as needed for chest pain.   sildenafil  (REVATIO ) 20 MG tablet Take 2-5 tabs PRN prior to sexual activity (Patient not taking: Reported on 06/30/2024)   [DISCONTINUED] carvedilol  (COREG ) 6.25 MG tablet TAKE 1 TABLET BY MOUTH TWICE DAILY WITH A MEAL . APPOINTMENT REQUIRED FOR FUTURE REFILLS   [DISCONTINUED] lisinopril  (ZESTRIL ) 30 MG tablet Take 1 tablet (30 mg total) by mouth daily.   [DISCONTINUED] rosuvastatin  (CRESTOR ) 20 MG tablet Take 1 tablet by mouth once daily   No facility-administered encounter medications on file as of 06/30/2024.    Allergies (verified) Entresto  [sacubitril-valsartan] and Peanut-containing drug products   History: Past Medical History:  Diagnosis Date  Allergy  04/2022   Stopped anything using peanuts   Anxiety    Diabetes mellitus type II, non insulin  dependent (HCC) 11/2015   Hypertension    NSTEMI (non-ST elevated myocardial infarction) (HCC) 12/23/2015   Past Surgical History:  Procedure Laterality Date   CARDIAC CATHETERIZATION N/A 12/25/2015   Procedure: Left Heart Cath and Coronary Angiography;  Surgeon: Peter M  [place], MD;  Location: [facility] INVASIVE CV LAB;  Service: Cardiovascular;  Laterality: N/A;   CORONARY ARTERY BYPASS GRAFT N/A 01/02/2016   Procedure: CORONARY ARTERY BYPASS GRAFTING (CABG)X 4 LIMA-LAD; SVG-RCA(ENDARDERECTOMY); SVG-DIAG; SVG-OM ENDOSCOPIC GREATER RIGHT SAPHENOUS VEIN HARVEST;  Surgeon: Maude Fleeta Ochoa, MD; LIMA-LAD, SVG-D, SVG-OM, SVG-RCA-PDA     HERNIA REPAIR     KNEE SURGERY     LEFT HEART CATH AND CORS/GRAFTS ANGIOGRAPHY N/A 12/25/2022   Procedure: LEFT HEART CATH AND CORS/GRAFTS ANGIOGRAPHY;  Surgeon: Dann Candyce RAMAN, MD;  Location: [facility] INVASIVE CV LAB;  Service: Cardiovascular;  Laterality: N/A;   TEE WITHOUT CARDIOVERSION N/A 01/02/2016   Procedure: TRANSESOPHAGEAL ECHOCARDIOGRAM (TEE);  Surgeon: Maude Fleeta Ochoa, MD;  [facility];  Service: Open Heart Surgery;  Laterality: N/A;   TONSILLECTOMY     US  ECHOCARDIOGRAPHY  12/24/2015   EF 55-60%, grade 1 dd   Family History  Problem Relation Age of Onset   Heart disease Mother    Cancer Father        stomach   Allergic rhinitis Sister    Asthma Neg Hx    Eczema Neg Hx    Social History   Socioeconomic History   Marital status: Divorced    Spouse name: Not on file   Number of children: Not on file   Years of education: Not on file   Highest education level: Associate degree: academic program  Occupational History   Not on file  Tobacco Use   Smoking status: Former    Current packs/day: 0.00    Types: Cigarettes    Quit date: 06/06/2012    Years since quitting: 12.0   Smokeless tobacco: Never  Vaping Use   Vaping status: Never Used  Substance and Sexual Activity   Alcohol use: No   Drug use: No   Sexual activity: Yes  Other Topics Concern   Not on file  Social History Narrative   Not on file   Social Drivers of Health   Financial Resource Strain: Low Risk  (06/30/2024)   Overall Financial Resource Strain (CARDIA)    Difficulty of Paying Living Expenses: Not hard at all  Food Insecurity: No Food  Insecurity (06/30/2024)   Hunger Vital Sign    Worried About Running Out of Food in the Last Year: Never true    Ran Out of Food in the Last Year: Never true  Transportation Needs: No Transportation Needs (06/30/2024)   PRAPARE - Administrator, Civil Service (Medical): No    Lack of Transportation (Non-Medical): No  Physical Activity: Sufficiently Active (06/30/2024)   Exercise Vital Sign    Days of Exercise per Week: 7 days    Minutes of Exercise per Session: 30 min  Stress: No Stress Concern Present (06/30/2024)   [employer] of Occupational Health - Occupational Stress Questionnaire    Feeling of Stress: Not at all  Social Connections: Moderately Isolated (06/30/2024)   Social Connection and Isolation Panel    Frequency of Communication with Friends and Family: More than three times a week    Frequency of Social Gatherings with Friends and  Family: More than three times a week    Attends Religious Services: Patient declined    Active Member of Clubs or Organizations: Yes    Attends Banker Meetings: Patient declined    Marital Status: Divorced    Tobacco Counseling Counseling given: Yes    Clinical Intake:  Pre-visit preparation completed: Yes  Pain : No/denies pain     BMI - recorded: 34.14 Nutritional Status: BMI > 30  Obese Nutritional Risks: None Diabetes: No  Lab Results  Component Value Date   HGBA1C 6.7 (H) 12/24/2015   HGBA1C 6.0 07/03/2014     How often do you need to have someone help you when you read instructions, pamphlets, or other written materials from your doctor or pharmacy?: 1 - Never  Interpreter Needed?: No  Information entered by :: Austen Wygant W CMA (AAMA)   Activities of Daily Living     06/27/2024   12:44 PM 09/27/2023   12:48 PM  In your present state of health, do you have any difficulty performing the following activities:  Hearing? 0 0  Vision? 0 0  Difficulty concentrating or making decisions? 0 0  Walking or  climbing stairs? 0 0  Dressing or bathing? 0 0  Doing errands, shopping? 0 0  Preparing Food and eating ? N N  Using the Toilet? N N  In the past six months, have you accidently leaked urine? N N  Do you have problems with loss of bowel control? N N  Managing your Medications? N N  Managing your Finances? N N  Housekeeping or managing your Housekeeping? N N    Patient Care Team: Joesph Annabella HERO, FNP as PCP - General (Family Medicine) Jeffrie Oneil BROCKS, MD as PCP - Cardiology (Cardiology)  I have updated your Care Teams any recent Medical Services you may have received from other providers in the past year.     Assessment:   This is a routine wellness examination for Strong.  Hearing/Vision screen Hearing Screening - Comments:: Patient denies any hearing difficulties.   Vision Screening - Comments:: Patient is up to date on yearly eye exams and has no vision difficulties   Goals Addressed             This Visit's Progress    Remain active and independent   On track      Depression Screen     06/30/2024    9:45 AM 10/01/2023    9:38 AM 11/02/2022   11:41 AM 10/29/2021    9:41 AM 11/06/2020    3:01 PM 12/20/2018    8:38 AM 06/13/2018    3:34 PM  PHQ 2/9 Scores  PHQ - 2 Score 0 0 0 0 0 0 0  PHQ- 9 Score 0  0 0       Fall Risk     06/27/2024   12:44 PM 10/01/2023    9:41 AM 09/27/2023   12:48 PM 11/02/2022   11:40 AM 10/29/2021    9:41 AM  Fall Risk   Falls in the past year? 0 0 0 0 0  Number falls in past yr: 0 0     Injury with Fall? 0 0 0    Risk for fall due to : No Fall Risks No Fall Risks     Follow up Falls evaluation completed;Education provided;Falls prevention discussed Falls prevention discussed;Education provided;Falls evaluation completed       MEDICARE RISK AT HOME:  Medicare Risk at Home Any stairs in  or around the home?: (Patient-Rptd) Yes If so, are there any without handrails?: (Patient-Rptd) No Home free of loose throw rugs in walkways, pet beds,  electrical cords, etc?: (Patient-Rptd) Yes Adequate lighting in your home to reduce risk of falls?: (Patient-Rptd) Yes Life alert?: (Patient-Rptd) No Use of a cane, walker or w/c?: (Patient-Rptd) No Grab bars in the bathroom?: (Patient-Rptd) Yes Shower chair or bench in shower?: (Patient-Rptd) No Elevated toilet seat or a handicapped toilet?: (Patient-Rptd) No  TIMED UP AND GO:  Was the test performed?  No  Cognitive Function: 6CIT completed        06/30/2024    9:45 AM 10/01/2023    9:41 AM  6CIT Screen  What Year? 0 points 0 points  What month? 0 points 0 points  What time? 0 points 0 points  Count back from 20 0 points 0 points  Months in reverse 0 points 0 points  Repeat phrase 0 points 0 points  Total Score 0 points 0 points    Immunizations Immunization History  Administered Date(s) Administered   Fluad Trivalent(High Dose 65+) 10/25/2023   Influenza,inj,Quad PF,6+ Mos 08/21/2014   Influenza-Unspecified 08/31/2017, 09/07/2018, 09/06/2020, 08/14/2021   Zoster, Live 07/29/2015    Screening Tests Health Maintenance  Topic Date Due   DTaP/Tdap/Td (1 - Tdap) Never done   Pneumococcal Vaccine: 50+ Years (1 of 2 - PCV) Never done   Colonoscopy  Never done   Zoster Vaccines- Shingrix (1 of 2) 11/26/2004   Influenza Vaccine  05/26/2024   COVID-19 Vaccine (1 - 2024-25 season) Never done   Medicare Annual Wellness (AWV)  09/30/2024   Hepatitis C Screening  Completed   HPV VACCINES  Aged Out   Meningococcal B Vaccine  Aged Out    Health Maintenance  Health Maintenance Due  Topic Date Due   DTaP/Tdap/Td (1 - Tdap) Never done   Pneumococcal Vaccine: 50+ Years (1 of 2 - PCV) Never done   Colonoscopy  Never done   Zoster Vaccines- Shingrix (1 of 2) 11/26/2004   Influenza Vaccine  05/26/2024   COVID-19 Vaccine (1 - 2024-25 season) Never done   Health Maintenance Items Addressed: Patient is aware of recommendations and verbalizes understanding. Declines recommended  colon cancer screening at this time.   Additional Screening:  Vision Screening: Recommended annual ophthalmology exams for early detection of glaucoma and other disorders of the eye. Would you like a referral to an eye doctor? No    Dental Screening: Recommended annual dental exams for proper oral hygiene  Community Resource Referral / Chronic Care Management: CRR required this visit?  No   CCM required this visit?  No   Plan:    I have personally reviewed and noted the following in the patient's chart:   Medical and social history Use of alcohol, tobacco or illicit drugs  Current medications and supplements including opioid prescriptions. Patient is not currently taking opioid prescriptions. Functional ability and status Nutritional status Physical activity Advanced directives List of other physicians Hospitalizations, surgeries, and ER visits in previous 12 months Vitals Screenings to include cognitive, depression, and falls Referrals and appointments  In addition, I have reviewed and discussed with patient certain preventive protocols, quality metrics, and best practice recommendations. A written personalized care plan for preventive services as well as general preventive health recommendations were provided to patient.   Danyia Borunda, CMA   06/30/2024   After Visit Summary: (MyChart) Due to this being a telephonic visit, the after visit summary with patients personalized  plan was offered to patient via MyChart   Notes: Nothing significant to report at this time.

## 2024-06-30 NOTE — Patient Instructions (Signed)
 [name] , Thank you for taking time out of your busy schedule to complete your Annual Wellness Visit with me. I enjoyed our conversation and look forward to speaking with you again next year. I, as well as your care team,  appreciate your ongoing commitment to your health goals. Please review the following plan we discussed and let me know if I can assist you in the future.  Your Game plan/ To Do List   Follow up Visits: We will see or speak with you next year for your Next Medicare AWV with our clinical staff   Clinician Recommendations:  Aim for 30 minutes of exercise or brisk walking, 6-8 glasses of water, and 5 servings of fruits and vegetables each day.    Wishing you many blessings and good health during the next year until our next visit.  -Del Wiseman   This is a list of the screenings recommended for you:  Health Maintenance  Topic Date Due   DTaP/Tdap/Td vaccine (1 - Tdap) Never done   Pneumococcal Vaccine for age over 3 (1 of 2 - PCV) Never done   Colon Cancer Screening  Never done   Zoster (Shingles) Vaccine (1 of 2) 11/26/2004   Flu Shot  05/26/2024   COVID-19 Vaccine (1 - 2024-25 season) Never done   Medicare Annual Wellness Visit  09/30/2024   Hepatitis C Screening  Completed   HPV Vaccine  Aged Out   Meningitis B Vaccine  Aged Out    Advanced directives: (In Chart) A copy of your advanced directives are scanned into your chart should your provider ever need it. Advance Care Planning is important because it:  [x]  Makes sure you receive the medical care that is consistent with your values, goals, and preferences  [x]  It provides guidance to your family and loved ones and reduces their decisional burden about whether or not they are making the right decisions based on your wishes.  Follow the link provided in your after visit summary or read over the paperwork we have mailed to you to help you started getting your Advance Directives in place. If you need assistance in  completing these, please reach out to us  so that we can help you!  See attachments for Preventive Care and Fall Prevention Tips.

## 2024-07-13 DIAGNOSIS — Z1330 Encounter for screening examination for mental health and behavioral disorders, unspecified: Secondary | ICD-10-CM | POA: Diagnosis not present

## 2024-07-13 DIAGNOSIS — M25512 Pain in left shoulder: Secondary | ICD-10-CM | POA: Diagnosis not present

## 2024-07-13 DIAGNOSIS — G8929 Other chronic pain: Secondary | ICD-10-CM | POA: Diagnosis not present

## 2024-07-13 DIAGNOSIS — M47812 Spondylosis without myelopathy or radiculopathy, cervical region: Secondary | ICD-10-CM | POA: Diagnosis not present

## 2024-08-22 DIAGNOSIS — M47812 Spondylosis without myelopathy or radiculopathy, cervical region: Secondary | ICD-10-CM | POA: Diagnosis not present

## 2024-08-22 DIAGNOSIS — G8929 Other chronic pain: Secondary | ICD-10-CM | POA: Diagnosis not present

## 2024-08-22 DIAGNOSIS — M25512 Pain in left shoulder: Secondary | ICD-10-CM | POA: Diagnosis not present

## 2024-08-22 DIAGNOSIS — M542 Cervicalgia: Secondary | ICD-10-CM | POA: Diagnosis not present

## 2024-09-28 ENCOUNTER — Ambulatory Visit: Payer: Self-pay

## 2024-09-28 NOTE — Telephone Encounter (Signed)
 Patient states he would like to know his PCP's advice on any over the counter medications for nausea or if she would send in a prescription for nausea.  Patient's pharmacy is verified as: [id number] - [place], [place] - [address]   FYI Only or Action Required?: Action required by provider: clinical question for provider and update on patient condition.  Patient was last seen in primary care on 11/02/2022 by Joesph Annabella HERO, FNP.  Called Nurse Triage reporting Nausea.  Symptoms began several days ago.  Interventions attempted: Rest, hydration, or home remedies.  Symptoms are: vomiting and diarrhea have subsided since yesterday but patient still having some nausea.  Triage Disposition: Home Care  Patient/caregiver understands and will follow disposition?: Yes--patient also requesting advice on over the counter medications or if a prescription for nausea could be sent in                  Copied from CRM #[id number]. Topic: Clinical - Red Word Triage >> Sep 28, 2024 10:08 AM Thomas Thomas wrote: Red Word that prompted transfer to Nurse Triage:  PT has Food poisoning since 12/09/ was experiencing Vomiting/Diarrhea but has gone away/Still experiencing Nasuea and only can eat Light food due to can not keep it down. Reason for Disposition  Unexplained nausea  Answer Assessment - Initial Assessment Questions After eating a can of beanies & weanies and a can of vienna sausages with vinegar, patient has been having nausea/vomiting/diarrhea since Monday night. Patient states the cans were in date but he typically does not eat things like this in a hurry like he did. Vomiting and diarrhea subsided yesterday around 2pm Patient denied any blood present in his stool or vomit His wife with him also verified that there was no yellowing of the white parts of his eyes Since then patient has had some Ginger Ale, crackers, chicken & rice soup, a piece of dry toast with grape  jelly Patient denies any pain anywhere except for some soreness in general abdomen area Patient still experiencing slight nausea Patient hasn't taken any over the counter medications Tuesday night patient had a low grade fever between 99-100 Patient denies dizziness, headache, lightheadedness, fever, chest pain, sore throat, current vomiting or diarrhea. He denies any heart problems in the past few months. Patient states that he feels a lot better but just still has some lingering nausea. He states he would like to know his PCP's advice on any over the counter medications for nausea or if she would send in a prescription for nausea.  Patient's pharmacy is verified as: [id number] - [place], [place] - [address]  Patient is advised to call us  back if anything changes or with any further questions/concerns. Patient is advised that if anything worsens to go to the Emergency Room. Patient verbalized understanding.  Protocols used: Nausea-A-AH

## 2024-09-28 NOTE — Telephone Encounter (Signed)
 I spoke to pt and advised he can use otc pepto, ginger ale and crackers per PCP. Pt would ntbs for any prescription to be sent in as he has not been seen since 10/2022.

## 2024-11-02 NOTE — Progress Notes (Signed)
 Thomas Thomas                                          MRN: [id number]   11/02/2024   The [facility] Quality Team Specialist reviewed this patient medical record for the purposes of chart review for care gap closure. The following were reviewed: chart review for care gap closure-controlling blood pressure.    [facility] Quality Team

## 2025-07-04 ENCOUNTER — Ambulatory Visit: Payer: Self-pay
